# Patient Record
Sex: Female | Born: 1960 | Race: White | Hispanic: No | Marital: Married | Smoking: Current every day smoker
Health system: Southern US, Community
[De-identification: ages and names within clinical notes are randomized; demographics above are authoritative.]

## PROBLEM LIST (undated history)

## (undated) DIAGNOSIS — E785 Hyperlipidemia, unspecified: Secondary | ICD-10-CM

## (undated) DIAGNOSIS — N3001 Acute cystitis with hematuria: Secondary | ICD-10-CM

## (undated) DIAGNOSIS — I700 Atherosclerosis of aorta: Secondary | ICD-10-CM

## (undated) DIAGNOSIS — I671 Cerebral aneurysm, nonruptured: Secondary | ICD-10-CM

## (undated) DIAGNOSIS — G43909 Migraine, unspecified, not intractable, without status migrainosus: Secondary | ICD-10-CM

## (undated) DIAGNOSIS — G40909 Epilepsy, unspecified, not intractable, without status epilepticus: Secondary | ICD-10-CM

## (undated) DIAGNOSIS — K219 Gastro-esophageal reflux disease without esophagitis: Secondary | ICD-10-CM

## (undated) DIAGNOSIS — Z9289 Personal history of other medical treatment: Secondary | ICD-10-CM

## (undated) DIAGNOSIS — E559 Vitamin D deficiency, unspecified: Secondary | ICD-10-CM

## (undated) DIAGNOSIS — Z6822 Body mass index (BMI) 22.0-22.9, adult: Secondary | ICD-10-CM

## (undated) DIAGNOSIS — F419 Anxiety disorder, unspecified: Secondary | ICD-10-CM

## (undated) DIAGNOSIS — R112 Nausea with vomiting, unspecified: Secondary | ICD-10-CM

## (undated) DIAGNOSIS — Z87442 Personal history of urinary calculi: Secondary | ICD-10-CM

## (undated) DIAGNOSIS — B977 Papillomavirus as the cause of diseases classified elsewhere: Secondary | ICD-10-CM

## (undated) DIAGNOSIS — E119 Type 2 diabetes mellitus without complications: Secondary | ICD-10-CM

## (undated) DIAGNOSIS — G250 Essential tremor: Secondary | ICD-10-CM

## (undated) DIAGNOSIS — Z9889 Other specified postprocedural states: Secondary | ICD-10-CM

## (undated) DIAGNOSIS — R413 Other amnesia: Secondary | ICD-10-CM

## (undated) DIAGNOSIS — J189 Pneumonia, unspecified organism: Secondary | ICD-10-CM

## (undated) DIAGNOSIS — D649 Anemia, unspecified: Secondary | ICD-10-CM

## (undated) DIAGNOSIS — I739 Peripheral vascular disease, unspecified: Secondary | ICD-10-CM

## (undated) DIAGNOSIS — M1990 Unspecified osteoarthritis, unspecified site: Secondary | ICD-10-CM

## (undated) DIAGNOSIS — K5900 Constipation, unspecified: Secondary | ICD-10-CM

## (undated) DIAGNOSIS — F32A Depression, unspecified: Secondary | ICD-10-CM

## (undated) HISTORY — PX: BREAST BIOPSY: SHX20

## (undated) HISTORY — DX: Atherosclerosis of aorta: I70.0

## (undated) HISTORY — DX: Acute cystitis with hematuria: N30.01

## (undated) HISTORY — DX: Essential tremor: G25.0

## (undated) HISTORY — DX: Hyperlipidemia, unspecified: E78.5

## (undated) HISTORY — DX: Cerebral aneurysm, nonruptured: I67.1

## (undated) HISTORY — DX: Body mass index (BMI) 22.0-22.9, adult: Z68.22

## (undated) HISTORY — PX: CATARACT EXTRACTION: SUR2

## (undated) HISTORY — PX: SALPINGOOPHORECTOMY: SHX82

## (undated) HISTORY — PX: ABDOMINAL HYSTERECTOMY: SHX81

## (undated) HISTORY — DX: Anxiety disorder, unspecified: F41.9

## (undated) HISTORY — DX: Vitamin D deficiency, unspecified: E55.9

## (undated) HISTORY — PX: VAGINAL HYSTERECTOMY: SUR661

## (undated) HISTORY — DX: Migraine, unspecified, not intractable, without status migrainosus: G43.909

## (undated) HISTORY — DX: Gastro-esophageal reflux disease without esophagitis: K21.9

## (undated) HISTORY — DX: Epilepsy, unspecified, not intractable, without status epilepticus: G40.909

## (undated) HISTORY — DX: Constipation, unspecified: K59.00

## (undated) HISTORY — PX: CARPAL TUNNEL RELEASE: SHX101

## (undated) HISTORY — DX: Type 2 diabetes mellitus without complications: E11.9

---

## 2002-12-26 ENCOUNTER — Encounter: Payer: Self-pay | Admitting: Specialist

## 2002-12-27 ENCOUNTER — Ambulatory Visit (HOSPITAL_COMMUNITY): Admission: RE | Admit: 2002-12-27 | Discharge: 2002-12-27 | Payer: Self-pay | Admitting: Specialist

## 2013-07-26 DIAGNOSIS — R259 Unspecified abnormal involuntary movements: Secondary | ICD-10-CM | POA: Insufficient documentation

## 2013-07-26 DIAGNOSIS — G40209 Localization-related (focal) (partial) symptomatic epilepsy and epileptic syndromes with complex partial seizures, not intractable, without status epilepticus: Secondary | ICD-10-CM | POA: Insufficient documentation

## 2013-07-26 DIAGNOSIS — R413 Other amnesia: Secondary | ICD-10-CM | POA: Insufficient documentation

## 2013-07-26 HISTORY — DX: Unspecified abnormal involuntary movements: R25.9

## 2013-07-26 HISTORY — DX: Localization-related (focal) (partial) symptomatic epilepsy and epileptic syndromes with complex partial seizures, not intractable, without status epilepticus: G40.209

## 2014-03-08 DIAGNOSIS — F418 Other specified anxiety disorders: Secondary | ICD-10-CM | POA: Insufficient documentation

## 2014-03-08 HISTORY — DX: Other specified anxiety disorders: F41.8

## 2014-05-10 DIAGNOSIS — G44229 Chronic tension-type headache, not intractable: Secondary | ICD-10-CM

## 2014-05-10 HISTORY — DX: Chronic tension-type headache, not intractable: G44.229

## 2017-05-20 DIAGNOSIS — Z1339 Encounter for screening examination for other mental health and behavioral disorders: Secondary | ICD-10-CM | POA: Diagnosis not present

## 2017-05-20 DIAGNOSIS — G40909 Epilepsy, unspecified, not intractable, without status epilepticus: Secondary | ICD-10-CM | POA: Diagnosis not present

## 2017-05-20 DIAGNOSIS — H6693 Otitis media, unspecified, bilateral: Secondary | ICD-10-CM | POA: Diagnosis not present

## 2017-05-20 DIAGNOSIS — Z1331 Encounter for screening for depression: Secondary | ICD-10-CM | POA: Diagnosis not present

## 2017-05-20 DIAGNOSIS — Z7689 Persons encountering health services in other specified circumstances: Secondary | ICD-10-CM | POA: Diagnosis not present

## 2017-05-25 DIAGNOSIS — Z6822 Body mass index (BMI) 22.0-22.9, adult: Secondary | ICD-10-CM | POA: Diagnosis not present

## 2017-05-25 DIAGNOSIS — R252 Cramp and spasm: Secondary | ICD-10-CM | POA: Diagnosis not present

## 2017-06-04 DIAGNOSIS — R3129 Other microscopic hematuria: Secondary | ICD-10-CM | POA: Diagnosis not present

## 2017-06-04 DIAGNOSIS — E781 Pure hyperglyceridemia: Secondary | ICD-10-CM | POA: Diagnosis not present

## 2017-06-04 DIAGNOSIS — Z01419 Encounter for gynecological examination (general) (routine) without abnormal findings: Secondary | ICD-10-CM | POA: Diagnosis not present

## 2017-06-04 DIAGNOSIS — Z6822 Body mass index (BMI) 22.0-22.9, adult: Secondary | ICD-10-CM | POA: Diagnosis not present

## 2017-06-04 DIAGNOSIS — Z1231 Encounter for screening mammogram for malignant neoplasm of breast: Secondary | ICD-10-CM | POA: Diagnosis not present

## 2017-06-17 DIAGNOSIS — R413 Other amnesia: Secondary | ICD-10-CM | POA: Diagnosis not present

## 2017-06-17 DIAGNOSIS — G43009 Migraine without aura, not intractable, without status migrainosus: Secondary | ICD-10-CM

## 2017-06-17 DIAGNOSIS — G40209 Localization-related (focal) (partial) symptomatic epilepsy and epileptic syndromes with complex partial seizures, not intractable, without status epilepticus: Secondary | ICD-10-CM | POA: Diagnosis not present

## 2017-06-17 DIAGNOSIS — Z1211 Encounter for screening for malignant neoplasm of colon: Secondary | ICD-10-CM | POA: Diagnosis not present

## 2017-06-17 DIAGNOSIS — R251 Tremor, unspecified: Secondary | ICD-10-CM | POA: Diagnosis not present

## 2017-06-17 DIAGNOSIS — G250 Essential tremor: Secondary | ICD-10-CM

## 2017-06-17 DIAGNOSIS — G40909 Epilepsy, unspecified, not intractable, without status epilepticus: Secondary | ICD-10-CM | POA: Diagnosis not present

## 2017-06-17 HISTORY — DX: Migraine without aura, not intractable, without status migrainosus: G43.009

## 2017-06-17 HISTORY — DX: Essential tremor: G25.0

## 2017-06-29 DIAGNOSIS — E871 Hypo-osmolality and hyponatremia: Secondary | ICD-10-CM | POA: Diagnosis not present

## 2017-07-01 DIAGNOSIS — R928 Other abnormal and inconclusive findings on diagnostic imaging of breast: Secondary | ICD-10-CM | POA: Diagnosis not present

## 2017-07-01 DIAGNOSIS — N6324 Unspecified lump in the left breast, lower inner quadrant: Secondary | ICD-10-CM | POA: Diagnosis not present

## 2017-07-01 DIAGNOSIS — N6320 Unspecified lump in the left breast, unspecified quadrant: Secondary | ICD-10-CM | POA: Diagnosis not present

## 2017-07-01 DIAGNOSIS — N6322 Unspecified lump in the left breast, upper inner quadrant: Secondary | ICD-10-CM | POA: Diagnosis not present

## 2017-07-01 DIAGNOSIS — R922 Inconclusive mammogram: Secondary | ICD-10-CM | POA: Diagnosis not present

## 2017-07-02 DIAGNOSIS — E781 Pure hyperglyceridemia: Secondary | ICD-10-CM | POA: Diagnosis not present

## 2017-07-02 DIAGNOSIS — N6320 Unspecified lump in the left breast, unspecified quadrant: Secondary | ICD-10-CM | POA: Diagnosis not present

## 2017-07-02 DIAGNOSIS — G40909 Epilepsy, unspecified, not intractable, without status epilepticus: Secondary | ICD-10-CM | POA: Diagnosis not present

## 2017-07-02 DIAGNOSIS — E785 Hyperlipidemia, unspecified: Secondary | ICD-10-CM | POA: Diagnosis not present

## 2017-07-02 DIAGNOSIS — Z6822 Body mass index (BMI) 22.0-22.9, adult: Secondary | ICD-10-CM | POA: Diagnosis not present

## 2017-07-06 DIAGNOSIS — N6324 Unspecified lump in the left breast, lower inner quadrant: Secondary | ICD-10-CM | POA: Diagnosis not present

## 2017-07-06 DIAGNOSIS — D242 Benign neoplasm of left breast: Secondary | ICD-10-CM | POA: Diagnosis not present

## 2017-07-06 DIAGNOSIS — R928 Other abnormal and inconclusive findings on diagnostic imaging of breast: Secondary | ICD-10-CM | POA: Diagnosis not present

## 2017-07-06 DIAGNOSIS — N6322 Unspecified lump in the left breast, upper inner quadrant: Secondary | ICD-10-CM | POA: Diagnosis not present

## 2017-07-24 DIAGNOSIS — D124 Benign neoplasm of descending colon: Secondary | ICD-10-CM | POA: Diagnosis not present

## 2017-07-24 DIAGNOSIS — K635 Polyp of colon: Secondary | ICD-10-CM | POA: Diagnosis not present

## 2017-07-24 DIAGNOSIS — Z1211 Encounter for screening for malignant neoplasm of colon: Secondary | ICD-10-CM | POA: Diagnosis not present

## 2017-07-28 DIAGNOSIS — G40909 Epilepsy, unspecified, not intractable, without status epilepticus: Secondary | ICD-10-CM | POA: Diagnosis not present

## 2017-07-28 DIAGNOSIS — G40209 Localization-related (focal) (partial) symptomatic epilepsy and epileptic syndromes with complex partial seizures, not intractable, without status epilepticus: Secondary | ICD-10-CM | POA: Diagnosis not present

## 2017-08-17 DIAGNOSIS — G40209 Localization-related (focal) (partial) symptomatic epilepsy and epileptic syndromes with complex partial seizures, not intractable, without status epilepticus: Secondary | ICD-10-CM | POA: Diagnosis not present

## 2017-08-17 DIAGNOSIS — G43009 Migraine without aura, not intractable, without status migrainosus: Secondary | ICD-10-CM | POA: Diagnosis not present

## 2017-09-30 DIAGNOSIS — G250 Essential tremor: Secondary | ICD-10-CM | POA: Diagnosis not present

## 2017-09-30 DIAGNOSIS — H532 Diplopia: Secondary | ICD-10-CM | POA: Insufficient documentation

## 2017-09-30 DIAGNOSIS — G43009 Migraine without aura, not intractable, without status migrainosus: Secondary | ICD-10-CM | POA: Diagnosis not present

## 2017-09-30 DIAGNOSIS — G40209 Localization-related (focal) (partial) symptomatic epilepsy and epileptic syndromes with complex partial seizures, not intractable, without status epilepticus: Secondary | ICD-10-CM | POA: Diagnosis not present

## 2017-09-30 HISTORY — DX: Diplopia: H53.2

## 2017-10-09 DIAGNOSIS — H532 Diplopia: Secondary | ICD-10-CM | POA: Diagnosis not present

## 2017-10-09 DIAGNOSIS — R9082 White matter disease, unspecified: Secondary | ICD-10-CM | POA: Diagnosis not present

## 2017-10-09 DIAGNOSIS — G40209 Localization-related (focal) (partial) symptomatic epilepsy and epileptic syndromes with complex partial seizures, not intractable, without status epilepticus: Secondary | ICD-10-CM | POA: Diagnosis not present

## 2017-10-19 DIAGNOSIS — G40209 Localization-related (focal) (partial) symptomatic epilepsy and epileptic syndromes with complex partial seizures, not intractable, without status epilepticus: Secondary | ICD-10-CM | POA: Diagnosis not present

## 2017-10-19 DIAGNOSIS — G250 Essential tremor: Secondary | ICD-10-CM | POA: Diagnosis not present

## 2017-10-19 DIAGNOSIS — G43009 Migraine without aura, not intractable, without status migrainosus: Secondary | ICD-10-CM | POA: Diagnosis not present

## 2018-02-18 DIAGNOSIS — Z23 Encounter for immunization: Secondary | ICD-10-CM | POA: Diagnosis not present

## 2018-03-29 DIAGNOSIS — G250 Essential tremor: Secondary | ICD-10-CM | POA: Diagnosis not present

## 2018-03-29 DIAGNOSIS — G40209 Localization-related (focal) (partial) symptomatic epilepsy and epileptic syndromes with complex partial seizures, not intractable, without status epilepticus: Secondary | ICD-10-CM | POA: Diagnosis not present

## 2018-03-29 DIAGNOSIS — R259 Unspecified abnormal involuntary movements: Secondary | ICD-10-CM | POA: Diagnosis not present

## 2018-03-29 DIAGNOSIS — G43009 Migraine without aura, not intractable, without status migrainosus: Secondary | ICD-10-CM | POA: Diagnosis not present

## 2018-06-08 DIAGNOSIS — Z6822 Body mass index (BMI) 22.0-22.9, adult: Secondary | ICD-10-CM | POA: Diagnosis not present

## 2018-06-08 DIAGNOSIS — R9431 Abnormal electrocardiogram [ECG] [EKG]: Secondary | ICD-10-CM | POA: Diagnosis not present

## 2018-06-08 DIAGNOSIS — Z1331 Encounter for screening for depression: Secondary | ICD-10-CM | POA: Diagnosis not present

## 2018-06-08 DIAGNOSIS — G40909 Epilepsy, unspecified, not intractable, without status epilepticus: Secondary | ICD-10-CM | POA: Diagnosis not present

## 2018-06-08 DIAGNOSIS — Z0000 Encounter for general adult medical examination without abnormal findings: Secondary | ICD-10-CM | POA: Diagnosis not present

## 2018-06-08 DIAGNOSIS — F419 Anxiety disorder, unspecified: Secondary | ICD-10-CM | POA: Diagnosis not present

## 2018-06-22 ENCOUNTER — Ambulatory Visit: Payer: Medicare HMO | Admitting: Cardiology

## 2018-06-22 ENCOUNTER — Encounter: Payer: Self-pay | Admitting: Cardiology

## 2018-06-22 VITALS — BP 122/70 | HR 91 | Ht 63.0 in | Wt 127.0 lb

## 2018-06-22 DIAGNOSIS — E782 Mixed hyperlipidemia: Secondary | ICD-10-CM

## 2018-06-22 DIAGNOSIS — G250 Essential tremor: Secondary | ICD-10-CM

## 2018-06-22 DIAGNOSIS — R9431 Abnormal electrocardiogram [ECG] [EKG]: Secondary | ICD-10-CM

## 2018-06-22 DIAGNOSIS — R0789 Other chest pain: Secondary | ICD-10-CM

## 2018-06-22 HISTORY — DX: Other chest pain: R07.89

## 2018-06-22 HISTORY — DX: Mixed hyperlipidemia: E78.2

## 2018-06-22 NOTE — Addendum Note (Signed)
Addended by: Orland Penman on: 06/22/2018 04:17 PM   Modules accepted: Orders

## 2018-06-22 NOTE — Patient Instructions (Signed)
Medication Instructions:   Your physician recommends that you continue on your current medications as directed. Please refer to the Current Medication list given to you today.   If you need a refill on your cardiac medications before your next appointment, please call your pharmacy.   Lab work:  NONE   Testing/Procedures:  Your physician has requested that you have a lexiscan myoview. For further information please visit [email or web address]. Please follow instruction sheet, as given.   Nuclear Medicine Exam  A nuclear medicine exam is a safe and painless imaging test. It helps your health care provider detect and diagnose disease in the body. It also provides information about the way your organs work and how they are structured. For a nuclear medicine exam, you will be given a radioactive tracer. This substance is absorbed by your body's organs. A large scanning machine detects the tracer and creates pictures of the areas that your health care provider wants to look at. There are several kinds of nuclear medicine exams. They include:  CT scan.  MRI.  PET scan. Tell a health care provider about:  Any allergies you have.  All medicines you are taking, including vitamins, herbs, eye drops, creams, and over-the-counter medicines.  Any problems you or family members have had with anesthetic medicines.  Any blood disorders you have.  Any surgeries you have had.  Any medical conditions you have.  Whether you are pregnant or may be pregnant.  Whether you are nursing. What happens before the procedure?  Ask your health care provider about changing or stopping your regular medicines.  Follow instructions from your health care provider about eating or drinking restrictions.  Do not wear jewelry.  Wear loose, comfortable clothing. You may be asked to wear a hospital gown for the procedure.  Bring previous imaging studies, such as X-rays, with you to the exam if they are  available. What happens during the procedure?  An IV tube may be inserted into one of your veins.  You will be asked to lie on a table or sit in a chair.  You will be given the radioactive tracer. You may get: ? A pill or liquid to swallow. ? An injection. ? Medicine through your IV tube. ? A gas to inhale.  A large scanning machine will be used to create images of your body. After the pictures are taken, you may have to wait so your health care provider can make sure that enough good images were taken. The procedure may vary among health care providers and hospitals. What happens after the procedure?  You may go right home after the procedure and return to your usual activities, unless told otherwise by your health care provider.  Drink enough water to keep urine clear or pale yellow. This helps to flush the radioactive tracer out of your body.  It is your responsibility to get your test results. Ask your health care provider or the department performing the test when your results will be ready.  Seek immediate medical care if you have shortness of breath. This information is not intended to replace advice given to you by your health care provider. Make sure you discuss any questions you have with your health care provider. Document Released: 05/29/2004 Document Revised: 12/20/2015 Document Reviewed: 11/08/2014 Elsevier Interactive Patient Education  2019 [employer].   Follow-Up: At [facility], you and your health needs are our priority.  As part of our continuing mission to provide you with exceptional heart care, we have  created designated Provider Care Teams.  These Care Teams include your primary Cardiologist (physician) and Advanced Practice Providers (APPs -  Physician Assistants and Nurse Practitioners) who all work together to provide you with the care you need, when you need it.  . You will need a follow up appointment in 6 months.  Please call our office 2 months in  advance to schedule this appointment.

## 2018-06-22 NOTE — Progress Notes (Signed)
Cardiology Office Note:    Date:  06/22/2018   ID:  Lindsay Erickson, DOB [birth year], MRN [id number]  PCP:  Serita Grammes, MD  Cardiologist:  Jenean Lindau, MD   Referring MD: Serita Grammes, MD    ASSESSMENT:    1. Benign familial tremor   2. Chest discomfort   3. Mixed dyslipidemia    PLAN:    In order of problems listed above:  1. Primary prevention stressed with the patient.  Importance of compliance with diet and medication stressed and she vocalized understanding.  Her blood pressure is stable. 2. Diet was discussed for dyslipidemia.  Lipids from January last year were reviewed and I discussed diet with her.  I told her to keep a track of her lipids with her primary care physician. 3. In view of her symptoms I will do a Lexiscan sestamibi.  Because of a seizure history she has issues with ambulation and prefers a pharmacological stress test.  Further recommendations will be made based on the findings of that test.  She knows to go to the nearest emergency room for any concerning symptoms. 4. Patient will be seen in follow-up appointment in 6 months or earlier if the patient has any concerns    Medication Adjustments/Labs and Tests Ordered: Current medicines are reviewed at length with the patient today.  Concerns regarding medicines are outlined above.  No orders of the defined types were placed in this encounter.  No orders of the defined types were placed in this encounter.    History of Present Illness:    Lindsay Erickson is a 58 y.o. female who is being seen today for the evaluation of chest discomfort at the request of Serita Grammes, MD.  Patient is a pleasant 58 year old female.  She is accompanied by her husband.  She has history of seizures and familial tremors.  She mentions to me that occasionally she will have chest discomfort.  This is not related to stress or exertion.  No orthopnea or PND.  No radiation of the symptoms to any part of the  body.  She mentions to me that she requested a primary care doctor for this evaluation because of the symptoms and her concerns.  At the time of my evaluation, the patient is alert awake oriented and in no distress.  Past Medical History:  Diagnosis Date  . Acid reflux   . Anxiety   . Constipation   . Migraine   . Seizure disorder [facility])     Past Surgical History:  Procedure Laterality Date  . ABDOMINAL HYSTERECTOMY      Current Medications: Current Meds  Medication Sig  . divalproex (DEPAKOTE ER) 500 MG 24 hr tablet Take 1 tablet by mouth 2 (two) times daily.  Marland Kitchen escitalopram (LEXAPRO) 5 MG tablet Take 1 tablet by mouth daily.     Allergies:   Gabapentin; Primidone; Propranolol; Venlafaxine; and Codeine   Social History   Socioeconomic History  . Marital status: Married    Spouse name: Not on file  . Number of children: Not on file  . Years of education: Not on file  . Highest education level: Not on file  Occupational History  . Not on file  Social Needs  . Financial resource strain: Not on file  . Food insecurity:    Worry: Not on file    Inability: Not on file  . Transportation needs:    Medical: Not on file    Non-medical: Not on file  Tobacco Use  . Smoking status: Current Every Day Smoker  . Smokeless tobacco: Never Used  Substance and Sexual Activity  . Alcohol use: Not on file  . Drug use: Not on file  . Sexual activity: Not on file  Lifestyle  . Physical activity:    Days per week: Not on file    Minutes per session: Not on file  . Stress: Not on file  Relationships  . Social connections:    Talks on phone: Not on file    Gets together: Not on file    Attends religious service: Not on file    Active member of club or organization: Not on file    Attends meetings of clubs or organizations: Not on file    Relationship status: Not on file  Other Topics Concern  . Not on file  Social History Narrative  . Not on file     Family History: The  patient's family history includes Heart disease in her father and mother.  ROS:   Please see the history of present illness.    All other systems reviewed and are negative.  EKGs/Labs/Other Studies Reviewed:    The following studies were reviewed today: EKG reveals sinus rhythm and nonspecific ST-T changes from her primary care doctor   Recent Labs: No results found for requested labs within last 8760 hours.  Recent Lipid Panel No results found for: CHOL, TRIG, HDL, CHOLHDL, VLDL, LDLCALC, LDLDIRECT  Physical Exam:    VS:  BP 122/70 (BP Location: Right Arm, Patient Position: Sitting, Cuff Size: Normal)   Pulse 91   Ht 5\' 3"  (1.6 m)   Wt 127 lb (57.6 kg)   SpO2 97%   BMI 22.50 kg/m     Wt Readings from Last 3 Encounters:  06/22/18 127 lb (57.6 kg)     GEN: Patient is in no acute distress HEENT: Normal NECK: No JVD; No carotid bruits LYMPHATICS: No lymphadenopathy CARDIAC: S1 S2 regular, 2/6 systolic murmur at the apex. RESPIRATORY:  Clear to auscultation without rales, wheezing or rhonchi  ABDOMEN: Soft, non-tender, non-distended MUSCULOSKELETAL:  No edema; No deformity  SKIN: Warm and dry NEUROLOGIC:  Alert and oriented x 3 PSYCHIATRIC:  Normal affect    Signed, Jenean Lindau, MD  06/22/2018 4:09 PM    [facility]

## 2018-07-05 DIAGNOSIS — E785 Hyperlipidemia, unspecified: Secondary | ICD-10-CM | POA: Diagnosis not present

## 2018-07-05 DIAGNOSIS — Z79899 Other long term (current) drug therapy: Secondary | ICD-10-CM | POA: Diagnosis not present

## 2018-07-06 DIAGNOSIS — Z1231 Encounter for screening mammogram for malignant neoplasm of breast: Secondary | ICD-10-CM | POA: Diagnosis not present

## 2018-07-09 ENCOUNTER — Other Ambulatory Visit: Payer: Self-pay | Admitting: *Deleted

## 2018-07-09 NOTE — Patient Outreach (Signed)
[facility]) Care Management  07/09/2018  JAMILA SLATTEN 08/24/1960 [id number]   Telephone Screen  Referral Date: 07/08/2018 Referral Source: self referral Referral Reason: self referral  Insurance: [place] attempt # 1 successful to the home number  Patient is able to verify HIPAA Reviewed and addressed referral to [facility] with patient   Mrs Zeidan voices concern about her results of her recent wellness check at her primary care MD and is wanting some assistance in her home care related to reducing her cholesterol values, and other medical diagnoses like migraines.   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 but has to take breaks if she "gets over heated" She has difficulty writing related to her tremors  She is on disability   Conditions: mixed hyperlipidemia,Benign familial tremors, Migraine without aura and without status migrainosus, not intractable, partial epilepsy with impairment of consciousness, 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 Patient gave verbal consent for  services.  Plan: [facility] RN CM will refer Mrs Schwartzkopf to [facility] SW for assistance with advance directives and smoke cessation resources   CM sent Mrs Bolding via mail and possible e mail address the following EMMI education on High cholesterol, can foods or supplements lower cholesterol?,  low cholesterol, saturated fat, and trans fat diet, migraines in adults, home headache remedies, and essential tremor  Rasheen Bells L. Lavina Hamman, RN, BSN, [place] Coordinator Office number [phone] Mobile number [phone]  Main [facility] number [phone] Fax number [phone]

## 2018-07-13 ENCOUNTER — Telehealth (HOSPITAL_COMMUNITY): Payer: Self-pay | Admitting: *Deleted

## 2018-07-13 NOTE — Telephone Encounter (Signed)
Patient given detailed instructions per Myocardial Perfusion Study Information Sheet for the test on 07/20/18 at 1130. Patient notified to arrive 15 minutes early and that it is imperative to arrive on time for appointment to keep from having the test rescheduled.  If you need to cancel or reschedule your appointment, please call the office within 24 hours of your appointment. . Patient verbalized understanding.Andersson Larr[place]ee, Ranae Palms

## 2018-07-19 ENCOUNTER — Other Ambulatory Visit: Payer: Self-pay | Admitting: *Deleted

## 2018-07-19 NOTE — Patient Outreach (Signed)
[facility]) Care Management  07/19/2018  Lindsay Erickson 1960/11/26 [id number]   CSW received referral and was able to make contact with pt today by phone. Pt's identity was confirmed and CSW introduced self and role to pt. Pt reports she went to her PCP for wellness check and is wanting to quit smoking. She is familiar with the "1-800-quit now" number and program and is considering calling them.  She also may talk to her PCP about RX for smoking cessation.  CSW offered to mail her some info that may be helpful. CSW also discussed Advance Directives to which she states she does not have in place- she is interested in a HCPOA and CSW will mail Advance Directive packet to her and plan a follow up call in 1-2 weeks.    Eduard Clos, MSW, [place] Worker  [place] [phone]

## 2018-07-20 ENCOUNTER — Other Ambulatory Visit: Payer: Self-pay

## 2018-07-20 ENCOUNTER — Ambulatory Visit (INDEPENDENT_AMBULATORY_CARE_PROVIDER_SITE_OTHER): Payer: Medicare HMO

## 2018-07-20 VITALS — Ht 63.0 in | Wt 127.0 lb

## 2018-07-20 DIAGNOSIS — R0789 Other chest pain: Secondary | ICD-10-CM

## 2018-07-20 DIAGNOSIS — R9431 Abnormal electrocardiogram [ECG] [EKG]: Secondary | ICD-10-CM

## 2018-07-20 LAB — MYOCARDIAL PERFUSION IMAGING
LV dias vol: 42 mL (ref 46–106)
LV sys vol: 9 mL
Peak HR: 98 {beats}/min
Rest HR: 83 {beats}/min
SDS: 2
SRS: 6
SSS: 8
TID: 1.28

## 2018-07-20 MED ORDER — ADENOSINE (DIAGNOSTIC) 3 MG/ML IV SOLN
0.5600 mg/kg | Freq: Once | INTRAVENOUS | Status: AC
  Administered 2018-07-20: 32.4 mg via INTRAVENOUS

## 2018-07-20 MED ORDER — TECHNETIUM TC 99M TETROFOSMIN IV KIT
11.0000 | PACK | Freq: Once | INTRAVENOUS | Status: AC | PRN
  Administered 2018-07-20: 11 via INTRAVENOUS

## 2018-07-20 MED ORDER — TECHNETIUM TC 99M TETROFOSMIN IV KIT
30.7000 | PACK | Freq: Once | INTRAVENOUS | Status: AC | PRN
  Administered 2018-07-20: 30.7 via INTRAVENOUS

## 2018-07-21 ENCOUNTER — Telehealth: Payer: Self-pay

## 2018-07-21 ENCOUNTER — Other Ambulatory Visit: Payer: Self-pay

## 2018-07-21 NOTE — Patient Outreach (Signed)
[facility]) Care Management  07/21/2018  JAYLEY HUSTEAD November 03, 1960 [id number]   Request received from [place], Eduard Clos, to send Advance Directive Packet and smoking cessation resources to patient.  Advance Directive EMMI and packet mailed today.  [facility] Tobacco Education information also mailed.  BSW will follow up with patient within the next two weeks to ensure receipt.  Ronn Melena, BSW Social Worker [phone]

## 2018-07-21 NOTE — Telephone Encounter (Signed)
-----   Message from Jenean Lindau, MD sent at 07/20/2018  4:03 PM EDT ----- The results of the study is unremarkable. Please inform patient. I will discuss in detail at next appointment. Cc  primary care/referring physician Jenean Lindau, MD 07/20/2018 4:03 PM

## 2018-07-21 NOTE — Telephone Encounter (Signed)
Patient called and notified of test results. 

## 2018-07-23 ENCOUNTER — Other Ambulatory Visit: Payer: Self-pay | Admitting: *Deleted

## 2018-07-23 NOTE — Patient Outreach (Signed)
[place] [facility]) Care Management  07/23/2018  BREAN CARBERRY Feb 05, 1961 [id number]  Care coordination  [facility] RN CM called to follow up on mailed EMMI education and possible referrals to [facility] health coach services   Outreach attempt # 1 No answer. [facility] RN CM left HIPAA compliant voicemail message along with CM's contact info.   Plan: [facility] RN CM scheduled this engaged patient for another call attempt within 4 business days    Toby Breithaupt L. Lavina Hamman, RN, BSN, [place] Coordinator Office number [phone] Mobile number [phone]  Main [facility] number [phone] Fax number [phone]

## 2018-07-26 ENCOUNTER — Other Ambulatory Visit: Payer: Self-pay

## 2018-07-26 ENCOUNTER — Other Ambulatory Visit: Payer: Self-pay | Admitting: *Deleted

## 2018-07-26 NOTE — Patient Outreach (Signed)
[facility]) Care Management  07/26/2018  VONDRA ALDREDGE 1961-04-18 [id number]   Care coordination   Mrs Heard confirms she received EMMI education information and will be soon going over it with her husband She states "It looks so far self explanatory" Cm encouraged her to contact CM if needed to review any of the EMMI materials and to answer questions She was notified of not being a candidate at this time for [facility] health coach services. She voiced understanding    [facility] RN CM will close case at this time as patient has been assessed and needs resolved for [facility] telephonic RN CM  She continues to be followed by [facility] SW staff Pt encouraged to return a call to [facility] RN CM prn   Kimberly L. Lavina Hamman, RN, BSN, [place] Coordinator Office number [phone] Mobile number [phone]  Main [facility] number [phone] Fax number [phone]

## 2018-07-30 ENCOUNTER — Ambulatory Visit: Payer: Self-pay | Admitting: *Deleted

## 2018-08-04 ENCOUNTER — Other Ambulatory Visit: Payer: Self-pay

## 2018-08-04 NOTE — Patient Outreach (Signed)
[facility]) Care Management  08/04/2018  Lindsay Erickson 1960/07/08 [id number]   Successful outreach to patient to ensure receipt of smoking cessation resources and Advance Directives packet.  Patient did confirm receipt and said that she has reviewed information.  She has not had time to discuss specifics of AD with family members. She denied questions at this time.  BSW is closing case but encouraged her to call if questions arise.  Ronn Melena, BSW Social Worker [phone]

## 2018-08-20 DIAGNOSIS — Z6821 Body mass index (BMI) 21.0-21.9, adult: Secondary | ICD-10-CM | POA: Diagnosis not present

## 2018-08-20 DIAGNOSIS — R300 Dysuria: Secondary | ICD-10-CM | POA: Diagnosis not present

## 2018-08-20 DIAGNOSIS — N200 Calculus of kidney: Secondary | ICD-10-CM | POA: Diagnosis not present

## 2018-09-10 DIAGNOSIS — Z6822 Body mass index (BMI) 22.0-22.9, adult: Secondary | ICD-10-CM | POA: Diagnosis not present

## 2018-09-10 DIAGNOSIS — E785 Hyperlipidemia, unspecified: Secondary | ICD-10-CM | POA: Diagnosis not present

## 2018-09-10 DIAGNOSIS — N200 Calculus of kidney: Secondary | ICD-10-CM | POA: Diagnosis not present

## 2018-09-10 DIAGNOSIS — J302 Other seasonal allergic rhinitis: Secondary | ICD-10-CM | POA: Diagnosis not present

## 2018-10-02 DIAGNOSIS — F419 Anxiety disorder, unspecified: Secondary | ICD-10-CM | POA: Diagnosis not present

## 2018-10-02 DIAGNOSIS — K219 Gastro-esophageal reflux disease without esophagitis: Secondary | ICD-10-CM | POA: Diagnosis not present

## 2018-10-04 DIAGNOSIS — G43009 Migraine without aura, not intractable, without status migrainosus: Secondary | ICD-10-CM | POA: Diagnosis not present

## 2018-10-04 DIAGNOSIS — R413 Other amnesia: Secondary | ICD-10-CM | POA: Diagnosis not present

## 2018-10-04 DIAGNOSIS — G250 Essential tremor: Secondary | ICD-10-CM | POA: Diagnosis not present

## 2018-10-04 DIAGNOSIS — G40209 Localization-related (focal) (partial) symptomatic epilepsy and epileptic syndromes with complex partial seizures, not intractable, without status epilepticus: Secondary | ICD-10-CM | POA: Diagnosis not present

## 2018-11-02 DIAGNOSIS — F419 Anxiety disorder, unspecified: Secondary | ICD-10-CM | POA: Diagnosis not present

## 2018-11-02 DIAGNOSIS — E785 Hyperlipidemia, unspecified: Secondary | ICD-10-CM | POA: Diagnosis not present

## 2018-11-02 DIAGNOSIS — K219 Gastro-esophageal reflux disease without esophagitis: Secondary | ICD-10-CM | POA: Diagnosis not present

## 2018-12-03 DIAGNOSIS — F419 Anxiety disorder, unspecified: Secondary | ICD-10-CM | POA: Diagnosis not present

## 2018-12-03 DIAGNOSIS — G40909 Epilepsy, unspecified, not intractable, without status epilepticus: Secondary | ICD-10-CM | POA: Diagnosis not present

## 2018-12-13 DIAGNOSIS — F17210 Nicotine dependence, cigarettes, uncomplicated: Secondary | ICD-10-CM | POA: Diagnosis not present

## 2018-12-13 DIAGNOSIS — F419 Anxiety disorder, unspecified: Secondary | ICD-10-CM | POA: Diagnosis not present

## 2018-12-13 DIAGNOSIS — E785 Hyperlipidemia, unspecified: Secondary | ICD-10-CM | POA: Diagnosis not present

## 2018-12-13 DIAGNOSIS — G40909 Epilepsy, unspecified, not intractable, without status epilepticus: Secondary | ICD-10-CM | POA: Diagnosis not present

## 2018-12-22 ENCOUNTER — Ambulatory Visit: Payer: Medicare HMO | Admitting: Cardiology

## 2019-01-03 DIAGNOSIS — E785 Hyperlipidemia, unspecified: Secondary | ICD-10-CM | POA: Diagnosis not present

## 2019-01-03 DIAGNOSIS — F419 Anxiety disorder, unspecified: Secondary | ICD-10-CM | POA: Diagnosis not present

## 2019-01-03 DIAGNOSIS — G40909 Epilepsy, unspecified, not intractable, without status epilepticus: Secondary | ICD-10-CM | POA: Diagnosis not present

## 2019-01-05 ENCOUNTER — Other Ambulatory Visit: Payer: Self-pay

## 2019-01-05 ENCOUNTER — Ambulatory Visit (INDEPENDENT_AMBULATORY_CARE_PROVIDER_SITE_OTHER): Payer: Medicare HMO | Admitting: Cardiology

## 2019-01-05 ENCOUNTER — Encounter: Payer: Self-pay | Admitting: Cardiology

## 2019-01-05 VITALS — BP 120/70 | HR 104 | Ht 63.0 in | Wt 123.0 lb

## 2019-01-05 DIAGNOSIS — E782 Mixed hyperlipidemia: Secondary | ICD-10-CM | POA: Diagnosis not present

## 2019-01-05 NOTE — Patient Instructions (Signed)
Medication Instructions:  Your physician recommends that you continue on your current medications as directed. Please refer to the Current Medication list given to you today.   If you need a refill on your cardiac medications before your next appointment, please call your pharmacy.   Lab work: Your physician recommends that you have a BMP, lipid and hepatic drawn today If you have labs (blood work) drawn today and your tests are completely normal, you will receive your results only by: Marland Kitchen MyChart Message (if you have MyChart) OR . A paper copy in the mail If you have any lab test that is abnormal or we need to change your treatment, we will call you to review the results.  Testing/Procedures: You had an EKG performed today.  Follow-Up: At [facility], you and your health needs are our priority.  As part of our continuing mission to provide you with exceptional heart care, we have created designated Provider Care Teams.  These Care Teams include your primary Cardiologist (physician) and Advanced Practice Providers (APPs -  Physician Assistants and Nurse Practitioners) who all work together to provide you with the care you need, when you need it. You will need a follow up appointment in 6 months.

## 2019-01-05 NOTE — Progress Notes (Signed)
Cardiology Office Note:    Date:  01/05/2019   ID:  QUINLEY JESKO, DOB 1960/11/21, MRN [id number]  PCP:  Serita Grammes, MD  Cardiologist:  Jenean Lindau, MD   Referring MD: Serita Grammes, MD    ASSESSMENT:    1. Mixed dyslipidemia    PLAN:    In order of problems listed above:  1. EKG done today reveals sinus tachycardia otherwise unremarkable. 2. Patient has mixed dyslipidemia.  I will get her back in the next few days for blood work and assess this.  Diet was discussed with her at length. 3. Results of stress test discussed with the patient at extensive length.  Importance of regular activity stressed 4. Patient will be seen in follow-up appointment in 6 months or earlier if the patient has any concerns    Medication Adjustments/Labs and Tests Ordered: Current medicines are reviewed at length with the patient today.  Concerns regarding medicines are outlined above.  No orders of the defined types were placed in this encounter.  No orders of the defined types were placed in this encounter.    Chief Complaint  Patient presents with  . Irregular Heart Beat     History of Present Illness:    Lindsay Erickson is a 58 y.o. female.  Patient has history of essential tremor and mixed dyslipidemia.  Her stress test was unremarkable.  She denies any chest pain orthopnea or PND.  She is now on statin therapy.  She mentions to me that her lipids need to be checked.  She is following diet strictly.  Past Medical History:  Diagnosis Date  . Acid reflux   . Anxiety   . Constipation   . Migraine   . Seizure disorder [facility])     Past Surgical History:  Procedure Laterality Date  . ABDOMINAL HYSTERECTOMY    . BREAST BIOPSY Left    X3  . CATARACT EXTRACTION Bilateral     Current Medications: Current Meds  Medication Sig  . divalproex (DEPAKOTE ER) 500 MG 24 hr tablet Take 1 tablet by mouth 2 (two) times daily.  Marland Kitchen escitalopram (LEXAPRO) 5 MG tablet Take 1  tablet by mouth daily.  . simvastatin (ZOCOR) 10 MG tablet Take 10 mg by mouth daily.     Allergies:   Gabapentin, Primidone, Propranolol, Venlafaxine, and Codeine   Social History   Socioeconomic History  . Marital status: Married    Spouse name: Not on file  . Number of children: Not on file  . Years of education: Not on file  . Highest education level: Not on file  Occupational History  . Not on file  Social Needs  . Financial resource strain: Not on file  . Food insecurity    Worry: Not on file    Inability: Not on file  . Transportation needs    Medical: Not on file    Non-medical: Not on file  Tobacco Use  . Smoking status: Current Every Day Smoker  . Smokeless tobacco: Never Used  Substance and Sexual Activity  . Alcohol use: Not on file  . Drug use: Not on file  . Sexual activity: Not on file  Lifestyle  . Physical activity    Days per week: Not on file    Minutes per session: Not on file  . Stress: Not on file  Relationships  . Social Herbalist on phone: Not on file    Gets together: Not on file  Attends religious service: Not on file    Active member of club or organization: Not on file    Attends meetings of clubs or organizations: Not on file    Relationship status: Not on file  Other Topics Concern  . Not on file  Social History Narrative  . Not on file     Family History: The patient's family history includes Atrial fibrillation in her sister; Diabetes in her brother and maternal grandmother; Heart attack in her maternal uncle and maternal uncle; Heart disease in her father and mother; Stroke in her mother.  ROS:   Please see the history of present illness.    All other systems reviewed and are negative.  EKGs/Labs/Other Studies Reviewed:    The following studies were reviewed today: Study Highlights   The left ventricular ejection fraction is hyperdynamic (>65%).  Nuclear stress EF: 78%.  Blood pressure demonstrated a  normal response to exercise.  There was no ST segment deviation noted during stress.  The study is normal.  This is a low risk study.  No evidence of ischemia or MI.  Normal LVEF.        Recent Labs: No results found for requested labs within last 8760 hours.  Recent Lipid Panel No results found for: CHOL, TRIG, HDL, CHOLHDL, VLDL, LDLCALC, LDLDIRECT  Physical Exam:    VS:  BP 120/70 (BP Location: Left Arm, Patient Position: Sitting, Cuff Size: Normal)   Pulse (!) 104   Ht 5\' 3"  (1.6 m)   Wt 123 lb (55.8 kg)   SpO2 97%   BMI 21.79 kg/m     Wt Readings from Last 3 Encounters:  01/05/19 123 lb (55.8 kg)  07/20/18 127 lb (57.6 kg)  06/22/18 127 lb (57.6 kg)     GEN: Patient is in no acute distress HEENT: Normal NECK: No JVD; No carotid bruits LYMPHATICS: No lymphadenopathy CARDIAC: Hear sounds regular, 2/6 systolic murmur at the apex. RESPIRATORY:  Clear to auscultation without rales, wheezing or rhonchi  ABDOMEN: Soft, non-tender, non-distended MUSCULOSKELETAL:  No edema; No deformity  SKIN: Warm and dry NEUROLOGIC:  Alert and oriented x 3 PSYCHIATRIC:  Normal affect   Signed, Jenean Lindau, MD  01/05/2019 3:07 PM    [facility]

## 2019-01-06 DIAGNOSIS — E782 Mixed hyperlipidemia: Secondary | ICD-10-CM | POA: Diagnosis not present

## 2019-01-06 LAB — HEPATIC FUNCTION PANEL
ALT: 5 IU/L (ref 0–32)
AST: 16 IU/L (ref 0–40)
Albumin: 4.7 g/dL (ref 3.8–4.9)
Alkaline Phosphatase: 92 IU/L (ref 39–117)
Bilirubin Total: <0.2 mg/dL (ref 0.0–1.2)
Bilirubin, Direct: 0.06 mg/dL (ref 0.00–0.40)
Total Protein: 7.1 g/dL (ref 6.0–8.5)

## 2019-01-06 LAB — LIPID PANEL
Chol/HDL Ratio: 4.4 ratio (ref 0.0–4.4)
Cholesterol, Total: 221 mg/dL — ABNORMAL HIGH (ref 100–199)
HDL: 50 mg/dL (ref >39)
LDL Chol Calc (NIH): 136 mg/dL — ABNORMAL HIGH (ref 0–99)
Triglycerides: 198 mg/dL — ABNORMAL HIGH (ref 0–149)
VLDL Cholesterol Cal: 35 mg/dL (ref 5–40)

## 2019-01-06 LAB — BASIC METABOLIC PANEL
BUN/Creatinine Ratio: 33 — ABNORMAL HIGH (ref 12–28)
BUN: 29 mg/dL — ABNORMAL HIGH (ref 8–27)
CO2: 20 mmol/L (ref 20–29)
Calcium: 9.5 mg/dL (ref 8.7–10.3)
Chloride: 103 mmol/L (ref 96–106)
Creatinine, Ser: 0.89 mg/dL (ref 0.57–1.00)
GFR calc Af Amer: 81 mL/min/{1.73_m2} (ref >59)
GFR calc non Af Amer: 71 mL/min/{1.73_m2} (ref >59)
Glucose: 97 mg/dL (ref 65–99)
Potassium: 4.5 mmol/L (ref 3.5–5.2)
Sodium: 139 mmol/L (ref 134–144)

## 2019-01-13 ENCOUNTER — Telehealth: Payer: Self-pay

## 2019-01-13 DIAGNOSIS — E782 Mixed hyperlipidemia: Secondary | ICD-10-CM

## 2019-01-13 MED ORDER — ROSUVASTATIN CALCIUM 10 MG PO TABS: 10.0000 mg | ORAL_TABLET | Freq: Every day | ORAL | 3 refills | Status: DC

## 2019-01-13 NOTE — Telephone Encounter (Signed)
Patient in agreement with starting rosuvastatin script sent to [facility]. She will come into office end of Oct for repeat labs fasting. No further questions.

## 2019-01-13 NOTE — Addendum Note (Signed)
Addended by: Beckey Rutter on: 01/13/2019 05:04 PM   Modules accepted: Orders

## 2019-01-13 NOTE — Telephone Encounter (Signed)
Left message for patient to call back for results.  

## 2019-01-13 NOTE — Telephone Encounter (Signed)
-----   Message from Jenean Lindau, MD sent at 01/07/2019  8:09 AM EDT ----- Diet strictly low in fatty foods and fried foods and low carbohydrates which is predominantly refined sugars.  Change medication from simvastatin to rosuvastatin 10 mg daily and liver lipid check in 6 weeks Jenean Lindau, MD 01/07/2019 8:08 AM

## 2019-01-17 DIAGNOSIS — Z23 Encounter for immunization: Secondary | ICD-10-CM | POA: Diagnosis not present

## 2019-01-17 DIAGNOSIS — F17210 Nicotine dependence, cigarettes, uncomplicated: Secondary | ICD-10-CM | POA: Diagnosis not present

## 2019-01-17 DIAGNOSIS — Z6821 Body mass index (BMI) 21.0-21.9, adult: Secondary | ICD-10-CM | POA: Diagnosis not present

## 2019-01-17 DIAGNOSIS — E785 Hyperlipidemia, unspecified: Secondary | ICD-10-CM | POA: Diagnosis not present

## 2019-01-17 DIAGNOSIS — F419 Anxiety disorder, unspecified: Secondary | ICD-10-CM | POA: Diagnosis not present

## 2019-02-02 DIAGNOSIS — F419 Anxiety disorder, unspecified: Secondary | ICD-10-CM | POA: Diagnosis not present

## 2019-02-02 DIAGNOSIS — J302 Other seasonal allergic rhinitis: Secondary | ICD-10-CM | POA: Diagnosis not present

## 2019-02-02 DIAGNOSIS — G40909 Epilepsy, unspecified, not intractable, without status epilepticus: Secondary | ICD-10-CM | POA: Diagnosis not present

## 2019-02-25 DIAGNOSIS — E782 Mixed hyperlipidemia: Secondary | ICD-10-CM | POA: Diagnosis not present

## 2019-02-25 LAB — HEPATIC FUNCTION PANEL
ALT: 8 IU/L (ref 0–32)
AST: 16 IU/L (ref 0–40)
Albumin: 4.4 g/dL (ref 3.8–4.9)
Alkaline Phosphatase: 91 IU/L (ref 39–117)
Bilirubin Total: <0.2 mg/dL (ref 0.0–1.2)
Bilirubin, Direct: <0.02 mg/dL (ref 0.00–0.40)
Total Protein: 7 g/dL (ref 6.0–8.5)

## 2019-02-25 LAB — LIPID PANEL
Chol/HDL Ratio: 2.9 ratio (ref 0.0–4.4)
Cholesterol, Total: 151 mg/dL (ref 100–199)
HDL: 52 mg/dL (ref >39)
LDL Chol Calc (NIH): 78 mg/dL (ref 0–99)
Triglycerides: 118 mg/dL (ref 0–149)
VLDL Cholesterol Cal: 21 mg/dL (ref 5–40)

## 2019-03-03 ENCOUNTER — Telehealth: Payer: Self-pay

## 2019-03-03 NOTE — Telephone Encounter (Signed)
-----   Message from Jenean Lindau, MD sent at 02/25/2019  5:30 PM EDT ----- The results of the study is unremarkable. Please inform patient. I will discuss in detail at next appointment. Cc  primary care/referring physician Jenean Lindau, MD 02/25/2019 5:30 PM

## 2019-03-03 NOTE — Telephone Encounter (Signed)
Results relayed, copy sent to Dr. Jeryl Columbia.

## 2019-03-04 DIAGNOSIS — E785 Hyperlipidemia, unspecified: Secondary | ICD-10-CM | POA: Diagnosis not present

## 2019-03-04 DIAGNOSIS — F419 Anxiety disorder, unspecified: Secondary | ICD-10-CM | POA: Diagnosis not present

## 2019-03-08 DIAGNOSIS — Z20828 Contact with and (suspected) exposure to other viral communicable diseases: Secondary | ICD-10-CM | POA: Diagnosis not present

## 2019-03-21 DIAGNOSIS — R7611 Nonspecific reaction to tuberculin skin test without active tuberculosis: Secondary | ICD-10-CM | POA: Diagnosis not present

## 2019-03-21 DIAGNOSIS — U071 COVID-19: Secondary | ICD-10-CM | POA: Diagnosis not present

## 2019-03-21 DIAGNOSIS — R251 Tremor, unspecified: Secondary | ICD-10-CM | POA: Diagnosis not present

## 2019-03-21 DIAGNOSIS — R1310 Dysphagia, unspecified: Secondary | ICD-10-CM | POA: Diagnosis not present

## 2019-03-21 DIAGNOSIS — J329 Chronic sinusitis, unspecified: Secondary | ICD-10-CM | POA: Diagnosis not present

## 2019-03-21 DIAGNOSIS — J324 Chronic pansinusitis: Secondary | ICD-10-CM | POA: Diagnosis not present

## 2019-04-04 DIAGNOSIS — F419 Anxiety disorder, unspecified: Secondary | ICD-10-CM | POA: Diagnosis not present

## 2019-04-04 DIAGNOSIS — K219 Gastro-esophageal reflux disease without esophagitis: Secondary | ICD-10-CM | POA: Diagnosis not present

## 2019-04-04 DIAGNOSIS — E785 Hyperlipidemia, unspecified: Secondary | ICD-10-CM | POA: Diagnosis not present

## 2019-04-05 ENCOUNTER — Other Ambulatory Visit: Payer: Self-pay | Admitting: Cardiology

## 2019-04-06 MED ORDER — ROSUVASTATIN CALCIUM 10 MG PO TABS: 10.0000 mg | ORAL_TABLET | Freq: Every day | ORAL | 3 refills | Status: DC

## 2019-04-13 DIAGNOSIS — R1310 Dysphagia, unspecified: Secondary | ICD-10-CM | POA: Diagnosis not present

## 2019-04-13 DIAGNOSIS — U071 COVID-19: Secondary | ICD-10-CM | POA: Diagnosis not present

## 2019-04-13 DIAGNOSIS — H70003 Acute mastoiditis without complications, bilateral: Secondary | ICD-10-CM | POA: Diagnosis not present

## 2019-04-13 DIAGNOSIS — G250 Essential tremor: Secondary | ICD-10-CM | POA: Diagnosis not present

## 2019-04-13 DIAGNOSIS — Z6822 Body mass index (BMI) 22.0-22.9, adult: Secondary | ICD-10-CM | POA: Diagnosis not present

## 2019-04-18 DIAGNOSIS — R1310 Dysphagia, unspecified: Secondary | ICD-10-CM | POA: Diagnosis not present

## 2019-04-18 DIAGNOSIS — G250 Essential tremor: Secondary | ICD-10-CM | POA: Diagnosis not present

## 2019-04-18 DIAGNOSIS — Z6822 Body mass index (BMI) 22.0-22.9, adult: Secondary | ICD-10-CM | POA: Diagnosis not present

## 2019-04-20 DIAGNOSIS — G250 Essential tremor: Secondary | ICD-10-CM | POA: Diagnosis not present

## 2019-04-20 DIAGNOSIS — G43009 Migraine without aura, not intractable, without status migrainosus: Secondary | ICD-10-CM | POA: Diagnosis not present

## 2019-04-20 DIAGNOSIS — G40209 Localization-related (focal) (partial) symptomatic epilepsy and epileptic syndromes with complex partial seizures, not intractable, without status epilepticus: Secondary | ICD-10-CM | POA: Diagnosis not present

## 2019-05-02 DIAGNOSIS — Z6826 Body mass index (BMI) 26.0-26.9, adult: Secondary | ICD-10-CM | POA: Diagnosis not present

## 2019-05-02 DIAGNOSIS — G250 Essential tremor: Secondary | ICD-10-CM | POA: Diagnosis not present

## 2019-05-02 DIAGNOSIS — U071 COVID-19: Secondary | ICD-10-CM | POA: Diagnosis not present

## 2019-05-02 DIAGNOSIS — R1310 Dysphagia, unspecified: Secondary | ICD-10-CM | POA: Diagnosis not present

## 2019-05-05 DIAGNOSIS — G250 Essential tremor: Secondary | ICD-10-CM | POA: Diagnosis not present

## 2019-05-05 DIAGNOSIS — U071 COVID-19: Secondary | ICD-10-CM | POA: Diagnosis not present

## 2019-05-26 DIAGNOSIS — G43009 Migraine without aura, not intractable, without status migrainosus: Secondary | ICD-10-CM | POA: Diagnosis not present

## 2019-05-26 DIAGNOSIS — R413 Other amnesia: Secondary | ICD-10-CM | POA: Diagnosis not present

## 2019-05-26 DIAGNOSIS — G40209 Localization-related (focal) (partial) symptomatic epilepsy and epileptic syndromes with complex partial seizures, not intractable, without status epilepticus: Secondary | ICD-10-CM | POA: Diagnosis not present

## 2019-05-26 DIAGNOSIS — G250 Essential tremor: Secondary | ICD-10-CM | POA: Diagnosis not present

## 2019-06-04 DIAGNOSIS — E785 Hyperlipidemia, unspecified: Secondary | ICD-10-CM | POA: Diagnosis not present

## 2019-06-04 DIAGNOSIS — F419 Anxiety disorder, unspecified: Secondary | ICD-10-CM | POA: Diagnosis not present

## 2019-06-04 DIAGNOSIS — J302 Other seasonal allergic rhinitis: Secondary | ICD-10-CM | POA: Diagnosis not present

## 2019-06-16 DIAGNOSIS — G250 Essential tremor: Secondary | ICD-10-CM | POA: Diagnosis not present

## 2019-06-16 DIAGNOSIS — G43009 Migraine without aura, not intractable, without status migrainosus: Secondary | ICD-10-CM | POA: Diagnosis not present

## 2019-06-16 DIAGNOSIS — R413 Other amnesia: Secondary | ICD-10-CM | POA: Diagnosis not present

## 2019-06-16 DIAGNOSIS — G40209 Localization-related (focal) (partial) symptomatic epilepsy and epileptic syndromes with complex partial seizures, not intractable, without status epilepticus: Secondary | ICD-10-CM | POA: Diagnosis not present

## 2019-06-27 DIAGNOSIS — R1310 Dysphagia, unspecified: Secondary | ICD-10-CM | POA: Diagnosis not present

## 2019-06-27 DIAGNOSIS — G250 Essential tremor: Secondary | ICD-10-CM | POA: Diagnosis not present

## 2019-06-27 DIAGNOSIS — B948 Sequelae of other specified infectious and parasitic diseases: Secondary | ICD-10-CM | POA: Diagnosis not present

## 2019-06-27 DIAGNOSIS — Z6822 Body mass index (BMI) 22.0-22.9, adult: Secondary | ICD-10-CM | POA: Diagnosis not present

## 2019-07-03 DIAGNOSIS — G40909 Epilepsy, unspecified, not intractable, without status epilepticus: Secondary | ICD-10-CM | POA: Diagnosis not present

## 2019-07-03 DIAGNOSIS — F419 Anxiety disorder, unspecified: Secondary | ICD-10-CM | POA: Diagnosis not present

## 2019-07-12 DIAGNOSIS — G250 Essential tremor: Secondary | ICD-10-CM | POA: Diagnosis not present

## 2019-07-12 DIAGNOSIS — Z6822 Body mass index (BMI) 22.0-22.9, adult: Secondary | ICD-10-CM | POA: Diagnosis not present

## 2019-07-12 DIAGNOSIS — Z79899 Other long term (current) drug therapy: Secondary | ICD-10-CM | POA: Diagnosis not present

## 2019-07-26 ENCOUNTER — Encounter: Payer: Self-pay | Admitting: Neurology

## 2019-07-27 DIAGNOSIS — G40909 Epilepsy, unspecified, not intractable, without status epilepticus: Secondary | ICD-10-CM | POA: Diagnosis not present

## 2019-07-27 DIAGNOSIS — Z6822 Body mass index (BMI) 22.0-22.9, adult: Secondary | ICD-10-CM | POA: Diagnosis not present

## 2019-07-27 DIAGNOSIS — R569 Unspecified convulsions: Secondary | ICD-10-CM | POA: Diagnosis not present

## 2019-07-27 DIAGNOSIS — G250 Essential tremor: Secondary | ICD-10-CM | POA: Diagnosis not present

## 2019-07-27 DIAGNOSIS — B948 Sequelae of other specified infectious and parasitic diseases: Secondary | ICD-10-CM | POA: Diagnosis not present

## 2019-07-27 DIAGNOSIS — R55 Syncope and collapse: Secondary | ICD-10-CM | POA: Diagnosis not present

## 2019-08-02 DIAGNOSIS — Z0000 Encounter for general adult medical examination without abnormal findings: Secondary | ICD-10-CM | POA: Diagnosis not present

## 2019-08-02 DIAGNOSIS — Z1331 Encounter for screening for depression: Secondary | ICD-10-CM | POA: Diagnosis not present

## 2019-08-02 DIAGNOSIS — Z6823 Body mass index (BMI) 23.0-23.9, adult: Secondary | ICD-10-CM | POA: Diagnosis not present

## 2019-08-02 DIAGNOSIS — G250 Essential tremor: Secondary | ICD-10-CM | POA: Diagnosis not present

## 2019-08-02 DIAGNOSIS — Z1211 Encounter for screening for malignant neoplasm of colon: Secondary | ICD-10-CM | POA: Diagnosis not present

## 2019-08-02 DIAGNOSIS — G40909 Epilepsy, unspecified, not intractable, without status epilepticus: Secondary | ICD-10-CM | POA: Diagnosis not present

## 2019-08-02 DIAGNOSIS — M7989 Other specified soft tissue disorders: Secondary | ICD-10-CM | POA: Diagnosis not present

## 2019-08-02 DIAGNOSIS — Z1239 Encounter for other screening for malignant neoplasm of breast: Secondary | ICD-10-CM | POA: Diagnosis not present

## 2019-08-02 DIAGNOSIS — E785 Hyperlipidemia, unspecified: Secondary | ICD-10-CM | POA: Diagnosis not present

## 2019-08-03 DIAGNOSIS — R930 Abnormal findings on diagnostic imaging of skull and head, not elsewhere classified: Secondary | ICD-10-CM | POA: Diagnosis not present

## 2019-08-03 DIAGNOSIS — R569 Unspecified convulsions: Secondary | ICD-10-CM | POA: Diagnosis not present

## 2019-08-03 DIAGNOSIS — R55 Syncope and collapse: Secondary | ICD-10-CM | POA: Diagnosis not present

## 2019-08-03 DIAGNOSIS — G40909 Epilepsy, unspecified, not intractable, without status epilepticus: Secondary | ICD-10-CM | POA: Diagnosis not present

## 2019-08-03 DIAGNOSIS — J321 Chronic frontal sinusitis: Secondary | ICD-10-CM | POA: Diagnosis not present

## 2019-08-04 DIAGNOSIS — I728 Aneurysm of other specified arteries: Secondary | ICD-10-CM | POA: Diagnosis not present

## 2019-08-04 DIAGNOSIS — I671 Cerebral aneurysm, nonruptured: Secondary | ICD-10-CM | POA: Diagnosis not present

## 2019-08-04 DIAGNOSIS — I720 Aneurysm of carotid artery: Secondary | ICD-10-CM | POA: Diagnosis not present

## 2019-08-04 DIAGNOSIS — R930 Abnormal findings on diagnostic imaging of skull and head, not elsewhere classified: Secondary | ICD-10-CM | POA: Diagnosis not present

## 2019-08-17 DIAGNOSIS — H26491 Other secondary cataract, right eye: Secondary | ICD-10-CM | POA: Diagnosis not present

## 2019-08-17 DIAGNOSIS — H524 Presbyopia: Secondary | ICD-10-CM | POA: Diagnosis not present

## 2019-08-17 DIAGNOSIS — H52202 Unspecified astigmatism, left eye: Secondary | ICD-10-CM | POA: Diagnosis not present

## 2019-08-17 DIAGNOSIS — Z961 Presence of intraocular lens: Secondary | ICD-10-CM | POA: Diagnosis not present

## 2019-08-22 ENCOUNTER — Telehealth: Payer: Self-pay

## 2019-08-22 NOTE — Telephone Encounter (Signed)
Appt Tat 08/23/19 was canceled by Tat for not treating pt diagnosis.Pt said Tat canceled stating tremors came from medication for epilepcy. Familous tremos inherited tremor diagnosis.  Both parents had diagnosis. Pt states testing previously confirmed diagnosis. Pt states originally treated by Dr. Charlaine Dalton but he is no longer local he is in [place]. New neurology Abram Sander MD pt was not satisfied with & pt requested refer to [facility]. Pt request review records again for inhereted  tremors not related to epilepsy. Please call pt [phone].

## 2019-08-22 NOTE — Telephone Encounter (Signed)
Spoke with the referring providers office we will wait until Lindsay Erickson gets back to see if that is something that she will see

## 2019-08-23 ENCOUNTER — Ambulatory Visit: Payer: Medicare HMO | Admitting: Neurology

## 2019-08-23 NOTE — Telephone Encounter (Signed)
Ok thanks 

## 2019-08-30 DIAGNOSIS — Z1231 Encounter for screening mammogram for malignant neoplasm of breast: Secondary | ICD-10-CM | POA: Diagnosis not present

## 2019-09-07 ENCOUNTER — Other Ambulatory Visit: Payer: Self-pay

## 2019-09-07 ENCOUNTER — Ambulatory Visit: Payer: Medicare HMO | Admitting: Neurology

## 2019-09-07 ENCOUNTER — Encounter: Payer: Self-pay | Admitting: Neurology

## 2019-09-07 VITALS — BP 114/73 | HR 90 | Resp 20 | Ht 62.5 in | Wt 133.0 lb

## 2019-09-07 DIAGNOSIS — G40009 Localization-related (focal) (partial) idiopathic epilepsy and epileptic syndromes with seizures of localized onset, not intractable, without status epilepticus: Secondary | ICD-10-CM

## 2019-09-07 DIAGNOSIS — R251 Tremor, unspecified: Secondary | ICD-10-CM | POA: Diagnosis not present

## 2019-09-07 DIAGNOSIS — I671 Cerebral aneurysm, nonruptured: Secondary | ICD-10-CM
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
## 2019-09-07 NOTE — Patient Instructions (Signed)
1. Reduce Depakote 500mg : take 1 tablet every night  2. Schedule 1-hour EEG, then 72-hour EEG  3. Refer to Interventional Radiology (Dr. Estanislado Pandy) for aneurysms  4. Follow-up in 3 months, call for any changes

## 2019-09-09 DIAGNOSIS — G250 Essential tremor: Secondary | ICD-10-CM | POA: Diagnosis not present

## 2019-09-09 DIAGNOSIS — Z6823 Body mass index (BMI) 23.0-23.9, adult: Secondary | ICD-10-CM | POA: Diagnosis not present

## 2019-09-09 DIAGNOSIS — F419 Anxiety disorder, unspecified: Secondary | ICD-10-CM | POA: Diagnosis not present

## 2019-09-14 ENCOUNTER — Ambulatory Visit (INDEPENDENT_AMBULATORY_CARE_PROVIDER_SITE_OTHER): Payer: Medicare HMO | Admitting: Neurology

## 2019-09-14 ENCOUNTER — Other Ambulatory Visit: Payer: Self-pay

## 2019-09-14 DIAGNOSIS — R251 Tremor, unspecified: Secondary | ICD-10-CM | POA: Diagnosis not present

## 2019-09-14 DIAGNOSIS — I671 Cerebral aneurysm, nonruptured: Secondary | ICD-10-CM

## 2019-09-14 DIAGNOSIS — G40009 Localization-related (focal) (partial) idiopathic epilepsy and epileptic syndromes with seizures of localized onset, not intractable, without status epilepticus: Secondary | ICD-10-CM

## 2019-09-19 ENCOUNTER — Telehealth: Payer: Self-pay | Admitting: Neurology

## 2019-09-19 NOTE — Telephone Encounter (Signed)
Called patient an advised of EEG

## 2019-09-19 NOTE — Procedures (Signed)
ELECTROENCEPHALOGRAM REPORT  Date of Study: 09/14/2019  Patient's Name: Lindsay Erickson MRN: [id number] Date of Birth: 12-27-1960  Referring Provider: Dr. [name]  Clinical History: This is a 59 year old woman with a history of essential tremor and seizures since childhood, with recurrent episodes of dizziness. EEG for classification.  Medications: DEPAKOTE ER 500 MG 24 hr tablet CRESTOR 10 MG tablet LEXAPRO 5 MG tablet  Technical Summary: A multichannel digital 1-hour EEG recording measured by the international 10-20 system with electrodes applied with paste and impedances below 5000 ohms performed in our laboratory with EKG monitoring in an awake and asleep patient.  Hyperventilation was not performed. Photic stimulation was performed.  The digital EEG was referentially recorded, reformatted, and digitally filtered in a variety of bipolar and referential montages for optimal display.    Description: The patient is awake and asleep during the recording.  During maximal wakefulness, there is a symmetric, medium voltage 8 Hz posterior dominant rhythm that attenuates with eye opening.  The record is symmetric.  During drowsiness and sleep, there is an increase in theta and delta slowing of the background with occasional vertex waves seen.  Photic stimulation did not elicit any abnormalities.  There is a 4 Hz movement/muscle artifact throughout majority of the study during wakefulness due to tremor, in between artifact and during brief drowsiness and sleep, there were no epileptiform discharges or electrographic seizures seen.    EKG lead was unremarkable.  Impression: This 1-hour awake and asleep EEG is normal, although slightly limited due to muscle/movement artifact over the bilateral temporal regions obscuring EEG during wakefulness.  Clinical Correlation: A normal EEG does not exclude a clinical diagnosis of epilepsy.  If further clinical questions remain, prolonged EEG may be  helpful.  Clinical correlation is advised.   [name], M.D.

## 2019-09-19 NOTE — Telephone Encounter (Signed)
Patient returned call for EEG results.

## 2019-10-03 DIAGNOSIS — E785 Hyperlipidemia, unspecified: Secondary | ICD-10-CM | POA: Diagnosis not present

## 2019-10-03 DIAGNOSIS — K219 Gastro-esophageal reflux disease without esophagitis: Secondary | ICD-10-CM | POA: Diagnosis not present

## 2019-12-19 ENCOUNTER — Ambulatory Visit: Payer: Medicare HMO | Admitting: Neurology

## 2019-12-19 ENCOUNTER — Encounter: Payer: Self-pay | Admitting: Neurology

## 2019-12-19 ENCOUNTER — Other Ambulatory Visit: Payer: Self-pay

## 2019-12-19 VITALS — BP 98/65 | HR 77 | Ht 63.0 in | Wt 130.0 lb

## 2019-12-19 DIAGNOSIS — G250 Essential tremor: Secondary | ICD-10-CM | POA: Diagnosis not present

## 2019-12-19 DIAGNOSIS — G40009 Localization-related (focal) (partial) idiopathic epilepsy and epileptic syndromes with seizures of localized onset, not intractable, without status epilepticus: Secondary | ICD-10-CM

## 2019-12-19 DIAGNOSIS — I671 Cerebral aneurysm, nonruptured: Secondary | ICD-10-CM

## 2019-12-19 MED ORDER — ESCITALOPRAM OXALATE 5 MG PO TABS: 5.0000 mg | ORAL_TABLET | Freq: Every day | ORAL | 3 refills | Status: DC

## 2019-12-19 MED ORDER — DIVALPROEX SODIUM ER 500 MG PO TB24: 500.0000 mg | ORAL_TABLET | Freq: Every day | ORAL | 3 refills | Status: DC
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
## 2019-12-22 NOTE — Progress Notes (Deleted)
Assessment/Plan:   1.  Essential Tremor.  -This is evidenced by the symmetrical nature and longstanding hx of gradually getting worse.  We discussed nature and pathophysiology.  We discussed that this can continue to gradually get worse with time.  We discussed that some medications can worsen this, as can caffeine use.  We discussed medication therapy as well as surgical therapy.  Ultimately, the patient decided to ***.     Subjective:   Lindsay Erickson was seen in consultation in the movement disorder clinic at the request of Cameron Sprang, MD.  The evaluation is for essential tremor and consideration for possible DBS.  I have reviewed records from Dr. Delice Lesch.  Patient is a 59 year old female with history of seizure since childhood and disabling essential tremor.  She has had tremors since the middle of the 1990s, but seemed to get worse over the last year.  Tremor is most noticeable when ***.   There is *** family hx of tremor.    Affected by caffeine:  {yes no:314532} Affected by alcohol:  {yes no:314532} Affected by stress:  {yes no:314532} Affected by fatigue:  {yes no:314532} Spills soup if on spoon:  {yes no:314532} Spills glass of liquid if full:  {yes no:314532} Affects ADL's (tying shoes, brushing teeth, etc):  {yes no:314532}  Current/Previously tried tremor medications: ***Primidone, beta-blocker, gabapentin; amantadine  Current medications that may exacerbate tremor:  ***Depakote (on for seizure) -seizure-free since 1980s.  Outside reports reviewed: {Outside review:15817}.  Patient had repeat MRI of the brain without contrast on March, 2021 with no acute change.  MRA of the brain in April, 2021 with 4 x 7 mm left periophthalmic artery aneurysm and 3 x 6 right periophthalmic artery aneurysm.  Films not available for my review.  This is per Dr. Amparo Bristol records  Allergies  Allergen Reactions  . Gabapentin Other (See Comments)    insomnia  . Primidone Other (See  Comments)    Couldn't take  . Propranolol Swelling    Can't take beta blockers  . Venlafaxine Other (See Comments)    Irritable/hyper  . Codeine Itching    Current Outpatient Medications  Medication Instructions  . divalproex (DEPAKOTE ER) 500 mg, Oral, Daily  . escitalopram (LEXAPRO) 5 mg, Oral, Daily  . rosuvastatin (CRESTOR) 10 mg, Oral, Daily     Objective:   VITALS:  There were no vitals filed for this visit. Gen:  Appears stated age and in NAD. HEENT:  Normocephalic, atraumatic. The mucous membranes are moist. The superficial temporal arteries are without ropiness or tenderness. Cardiovascular: Regular rate and rhythm. Lungs: Clear to auscultation bilaterally. Neck: There are no carotid bruits noted bilaterally.  NEUROLOGICAL:  Orientation:  The patient is alert and oriented x 3.   Cranial nerves: There is good facial symmetry. Extraocular muscles are intact and visual fields are full to confrontational testing. Speech is fluent and clear. Soft palate rises symmetrically and there is no tongue deviation. Hearing is intact to conversational tone. Tone: Tone is good throughout. Sensation: Sensation is intact to light touch touch throughout (facial, trunk, extremities). Vibration is intact at the bilateral big toe. There is no extinction with double simultaneous stimulation. There is no sensory dermatomal level identified. Coordination:  The patient has no dysdiadichokinesia or dysmetria. Motor: Strength is 5/5 in the bilateral upper and lower extremities.  Shoulder shrug is equal bilaterally.  There is no pronator drift.  There are no fasciculations noted. DTR's: Deep tendon reflexes are 2/4 at the bilateral biceps,  triceps, brachioradialis, patella and achilles.  Plantar responses are downgoing bilaterally. Gait and Station: The patient is able to ambulate without difficulty. The patient is able to heel toe walk without any difficulty. The patient is able to ambulate in a  tandem fashion. The patient is able to stand in the Romberg position.   MOVEMENT EXAM: Tremor:  There is *** tremor in the UE, noted most significantly with action.  The patient is *** able to draw Archimedes spirals without significant difficulty.  There is *** tremor at rest.  The patient is *** able to pour water from one glass to another without spilling it.  I have reviewed and interpreted the following labs independently   Chemistry      Component Value Date/Time   NA 139 01/06/2019 1022   K 4.5 01/06/2019 1022   CL 103 01/06/2019 1022   CO2 20 01/06/2019 1022   BUN 29 (H) 01/06/2019 1022   CREATININE 0.89 01/06/2019 1022      Component Value Date/Time   CALCIUM 9.5 01/06/2019 1022   ALKPHOS 91 02/25/2019 0950   AST 16 02/25/2019 0950   ALT 8 02/25/2019 0950   BILITOT <0.2 02/25/2019 0950      No results found for: WBC, HGB, HCT, MCV, PLT No results found for: TSH    Total time spent on today's visit was ***60 minutes, including both face-to-face time and nonface-to-face time.  Time included that spent on review of records (prior notes available to me/labs/imaging if pertinent), discussing treatment and goals, answering patient's questions and coordinating care.  CC:  Serita Grammes, MD

## 2019-12-26 ENCOUNTER — Telehealth: Payer: Self-pay | Admitting: Neurology

## 2019-12-26 ENCOUNTER — Other Ambulatory Visit (HOSPITAL_COMMUNITY): Payer: Self-pay | Admitting: Interventional Radiology

## 2019-12-26 ENCOUNTER — Ambulatory Visit: Payer: Medicare HMO | Admitting: Neurology

## 2019-12-26 DIAGNOSIS — I671 Cerebral aneurysm, nonruptured: Secondary | ICD-10-CM

## 2019-12-26 NOTE — Telephone Encounter (Signed)
Patient called in to let Dr. Delice Lesch know she will be seeing the neuro surgeon you referred her to on Thursday.

## 2019-12-29 ENCOUNTER — Other Ambulatory Visit: Payer: Self-pay

## 2019-12-29 ENCOUNTER — Ambulatory Visit (HOSPITAL_COMMUNITY)
Admission: RE | Admit: 2019-12-29 | Discharge: 2019-12-29 | Disposition: A | Discharge status: Home / Self Care | Payer: Medicare HMO | Source: Ambulatory Visit | Attending: Interventional Radiology | Admitting: Interventional Radiology

## 2019-12-29 ENCOUNTER — Telehealth: Payer: Self-pay | Admitting: Neurology

## 2019-12-29 DIAGNOSIS — Z8249 Family history of ischemic heart disease and other diseases of the circulatory system: Secondary | ICD-10-CM | POA: Diagnosis not present

## 2019-12-29 DIAGNOSIS — I671 Cerebral aneurysm, nonruptured: Secondary | ICD-10-CM

## 2019-12-29 NOTE — Telephone Encounter (Signed)
Dr Estanislado Pandy called to speak with Dr Delice Lesch in regards to patient. Requests a call back.  Work #: [phone] Page #: [phone] Cell [phone]: [phone]

## 2019-12-29 NOTE — Telephone Encounter (Addendum)
Opened in error

## 2019-12-29 NOTE — Consult Note (Signed)
Chief Complaint: Patient was seen in consultation today for evaluation and treatment of recently discovered brain aneurysms  Referring Physician(s): Dr.Aquino MD  History of Present Illness: Lindsay Erickson is a 59 y.o. female with a past medical history as below, requested to be seen for evaluation and management of recently discovered bilateral brain aneurysms.  Patient is accompanied by her husband.  History obtained from the patient, husband, and also from the medical electronic chart..  The patient was most recently seen by Dr. Greggory Erickson on 12/19/2019.  At that visit which was for management of patient's seizures and tremors, the patient had described a history of the patient had described history of occasional headaches.  She also explained of having experienced sudden severe right eye pain lasting to 3 minutes during which time she cannot open her eyes.  This was not associated with any loss of consciousness, nausea, vomiting, seizures, motor or sensory or speech difficulties. 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  Patient reports her mother died from a ruptured brain aneurysm repair. Significant past medical history of tremors since since 1990s worsened following last year following her infection with COVID-19..  Other past medical history as mentioned earlier. Past Medical History:  Diagnosis Date  . Acid reflux   . Anxiety   . Constipation   . Migraine   . Seizure disorder [facility])     Past Surgical History:  Procedure Laterality Date  . ABDOMINAL HYSTERECTOMY    . BREAST BIOPSY Left    X3  . CATARACT EXTRACTION Bilateral     Allergies: Gabapentin, Primidone, Propranolol, Venlafaxine, and Codeine  Medications: Prior to Admission medications   Medication Sig Start Date End Date Taking? Authorizing Provider   divalproex (DEPAKOTE ER) 500 MG 24 hr tablet Take 1 tablet (500 mg total) by mouth daily. 12/19/19   Cameron Sprang, MD  escitalopram (LEXAPRO) 5 MG tablet Take 1 tablet (5 mg total) by mouth daily. 12/19/19   Cameron Sprang, MD  rosuvastatin (CRESTOR) 10 MG tablet Take 1 tablet (10 mg total) by mouth daily. 04/06/19 12/19/19  Revankar, Reita Cliche, MD     Family History  Problem Relation Age of Onset  . Heart disease Mother   . Stroke Mother   . Heart disease Father   . Atrial fibrillation Sister   . Diabetes Brother   . Diabetes Maternal Grandmother   . Heart attack Maternal Uncle   . Heart attack Maternal Uncle     Social History   Socioeconomic History  . Marital status: Married    Spouse name: Not on file  . Number of children: Not on file  . Years of education: Not on file  . Highest education level: Not on file  Occupational History  . Not on file  Tobacco Use  . Smoking status: Current Every Day Smoker  . Smokeless tobacco: Never Used  Vaping Use  . Vaping Use: Never used  Substance and Sexual Activity  . Alcohol use: Never  . Drug use: Never  . Sexual activity: Not on file  Other Topics Concern  . Not on file  Social History Narrative   One story home mobile home   Right handed   Drinks caffeine on occasion   Social Determinants of Health   Financial Resource Strain:   .  Difficulty of Paying Living Expenses: Not on file  Food Insecurity:   . Worried About Charity fundraiser in the Last Year: Not on file  . Ran Out of Food in the Last Year: Not on file  Transportation Needs:   . Lack of Transportation (Medical): Not on file  . Lack of Transportation (Non-Medical): Not on file  Physical Activity:   . Days of Exercise per Week: Not on file  . Minutes of Exercise per Session: Not on file  Stress:   . Feeling of Stress : Not on file  Social Connections:   . Frequency of Communication with Friends and Family: Not on file  . Frequency of Social Gatherings  with Friends and Family: Not on file  . Attends Religious Services: Not on file  . Active Member of Clubs or Organizations: Not on file  . Attends Archivist Meetings: Not on file  . Marital Status: Not on file     Review of Systems: A 12 point ROS discussed and pertinent positives are indicated in the HPI above.  All other systems are negative.  Review of Systems.  Negative unless as mentioned above.  Vital Signs: There were no vitals taken for this visit.  Physical Exam.  Throughout exam the patient was almost constantly fidgety with the high amplitude tremors of her upper extremity and to a lesser degree at the trunk and the lower extremities.  Intention tremor will apparently worsen with reaching out for objects.  The patient and husband report part of this is related to the anxiety of being at [employer].  Otherwise alert awake oriented to time place space.  Speech intermittently impaired related to the tremors.  No lateralizing neurological abnormalities noted otherwise Imaging: No results found.  Labs:  CBC: No results for input(s): WBC, HGB, HCT, PLT in the last 8760 hours.  COAGS: No results for input(s): INR, APTT in the last 8760 hours.  BMP: Recent Labs    01/06/19 1022  NA 139  K 4.5  CL 103  CO2 20  GLUCOSE 97  BUN 29*  CALCIUM 9.5  CREATININE 0.89  GFRNONAA 71  GFRAA 81    LIVER FUNCTION TESTS: Recent Labs    01/06/19 1022 02/25/19 0950  BILITOT <0.2 <0.2  AST 16 16  ALT 5 8  ALKPHOS 92 91  PROT 7.1 7.0  ALBUMIN 4.7 4.4    TUMOR MARKERS: No results for input(s): AFPTM, CEA, CA199, CHROMGRNA in the last 8760 hours.  Assessment and Plan:  Patient words were aware of the intracranial brain aneurysms.  She and her husband from healthy morphology and natural history of unruptured brain aneurysms.  These were seen on the MRI/MRA f of the brain performed to evaluate the reason for her worsening seizures and her tremors.  The risk  of rupture of brain aneurysms was estimated at 2 %/year prior aneurysm.  The mortality and morbidity of ruptured brain aneurysms are reviewed also.  The risk of rupture was increased and patient with a family history of ruptured or unruptured brain aneurysms, female gender, hypertension, smoking, possibly arteriosclerotic external disease, and use of drugs such as cocaine, and amphetamines and possible marijuana.  Hemodynamic changes within the aneurysm related to blood flow was discussed.  Options of management were closed off continued close medical surveillance versus consideration of exclusion of aneurysms from the circulation to lessen and eliminate the risk of  rupture.  The endovascular treatment options were reviewed in detail.  The  procedures, t the risks of thromboembolic stroke, he delayed rupture and intraoperative rupture with potential for death were also reviewed in detail. That initial step will be to further clarify of the aorta dates of the aneurysms with a diagnostic cath arteriogram.  This procedure, would entail placement of a catheter either through the wrist all the groin and taking pictures in the brain.  Further management in terms of treatment will be dictated by the affirmation obtained from family at diagnosis arteriograms.  The treatment option would be under general anesthesia with overnight stay in the neuro ICU to monitor the neurological function, and other parameters such as blood pressure and use of IV heparin.  Both the patient and the spouse would like to proceed with the initial diagnostic cath arteriogram and subsequently endovascular treatment depending on the findings on diagnostic arteriogram.  Informed patient that our schedulers will call to schedule diagnostic cath arteriogram of the cerebral vasculature as soon as possible. All questions answered and concerns addressed. Patient conveys understanding and agrees with plan.  Thank you for this interesting  consult.  I greatly enjoyed meeting Lindsay Erickson and look forward to participating in their care.  A copy of this report was sent to the requesting provider on this date.  Electronically Signed: Rob Hickman, MD 12/29/2019, 2:22 PM   I spent a total 50 minutes in face-to-facein clinical consultation, greater than 50% of which was counseling/coordinating care for discussing the reasons and the procedure of diagnostic cath arteriogram, and also discussing the endovascular, treatment options.  Also discussed was the post treatment care.

## 2019-12-30 NOTE — Telephone Encounter (Signed)
Spoke to Dr. Arlean Hopping regarding plan for patient. Diagnostic arteriogram may have to wait due to pandemic, then after having plan from evaluation, proceed with discussion about DBS for essential tremor. Patient had been scheduled with Movement Disorders specialist Dr. [name] but patient cancelled appt.

## 2020-01-02 ENCOUNTER — Other Ambulatory Visit (HOSPITAL_COMMUNITY): Payer: Self-pay | Admitting: Interventional Radiology

## 2020-01-02 ENCOUNTER — Ambulatory Visit (HOSPITAL_COMMUNITY): Payer: Medicare HMO

## 2020-01-02 DIAGNOSIS — I671 Cerebral aneurysm, nonruptured: Secondary | ICD-10-CM

## 2020-01-03 DIAGNOSIS — E785 Hyperlipidemia, unspecified: Secondary | ICD-10-CM | POA: Diagnosis not present

## 2020-01-03 DIAGNOSIS — G250 Essential tremor: Secondary | ICD-10-CM | POA: Diagnosis not present

## 2020-01-03 DIAGNOSIS — F419 Anxiety disorder, unspecified: Secondary | ICD-10-CM | POA: Diagnosis not present

## 2020-01-11 ENCOUNTER — Other Ambulatory Visit: Payer: Self-pay | Admitting: Student

## 2020-01-12 ENCOUNTER — Encounter (HOSPITAL_COMMUNITY): Payer: Self-pay

## 2020-01-12 ENCOUNTER — Ambulatory Visit (HOSPITAL_COMMUNITY)
Admission: RE | Admit: 2020-01-12 | Discharge: 2020-01-12 | Disposition: A | Discharge status: Home / Self Care | Payer: Medicare HMO | Source: Ambulatory Visit | Attending: Interventional Radiology | Admitting: Interventional Radiology

## 2020-01-12 ENCOUNTER — Other Ambulatory Visit (HOSPITAL_COMMUNITY): Payer: Self-pay | Admitting: Interventional Radiology

## 2020-01-12 ENCOUNTER — Other Ambulatory Visit: Payer: Self-pay

## 2020-01-12 DIAGNOSIS — Z8616 Personal history of COVID-19: Secondary | ICD-10-CM | POA: Insufficient documentation

## 2020-01-12 DIAGNOSIS — I671 Cerebral aneurysm, nonruptured: Secondary | ICD-10-CM

## 2020-01-12 DIAGNOSIS — K219 Gastro-esophageal reflux disease without esophagitis: Secondary | ICD-10-CM | POA: Diagnosis not present

## 2020-01-12 DIAGNOSIS — Z885 Allergy status to narcotic agent status: Secondary | ICD-10-CM | POA: Diagnosis not present

## 2020-01-12 DIAGNOSIS — Z7982 Long term (current) use of aspirin: Secondary | ICD-10-CM | POA: Insufficient documentation

## 2020-01-12 DIAGNOSIS — Z833 Family history of diabetes mellitus: Secondary | ICD-10-CM | POA: Diagnosis not present

## 2020-01-12 DIAGNOSIS — Z90710 Acquired absence of both cervix and uterus: Secondary | ICD-10-CM | POA: Diagnosis not present

## 2020-01-12 DIAGNOSIS — Z8249 Family history of ischemic heart disease and other diseases of the circulatory system: Secondary | ICD-10-CM | POA: Insufficient documentation

## 2020-01-12 DIAGNOSIS — Z79899 Other long term (current) drug therapy: Secondary | ICD-10-CM | POA: Insufficient documentation

## 2020-01-12 DIAGNOSIS — F419 Anxiety disorder, unspecified: Secondary | ICD-10-CM | POA: Diagnosis not present

## 2020-01-12 DIAGNOSIS — F17210 Nicotine dependence, cigarettes, uncomplicated: Secondary | ICD-10-CM | POA: Diagnosis not present

## 2020-01-12 DIAGNOSIS — Z888 Allergy status to other drugs, medicaments and biological substances status: Secondary | ICD-10-CM | POA: Insufficient documentation

## 2020-01-12 DIAGNOSIS — G40909 Epilepsy, unspecified, not intractable, without status epilepticus: Secondary | ICD-10-CM | POA: Insufficient documentation

## 2020-01-12 HISTORY — PX: IR ANGIO INTRA EXTRACRAN SEL COM CAROTID INNOMINATE BILAT MOD SED: IMG5360

## 2020-01-12 HISTORY — PX: IR US GUIDE VASC ACCESS RIGHT: IMG2390

## 2020-01-12 HISTORY — PX: IR ANGIO VERTEBRAL SEL VERTEBRAL BILAT MOD SED: IMG5369

## 2020-01-12 LAB — CBC
HCT: 42.6 % (ref 36.0–46.0)
Hemoglobin: 13.5 g/dL (ref 12.0–15.0)
MCH: 32.9 pg (ref 26.0–34.0)
MCHC: 31.7 g/dL (ref 30.0–36.0)
MCV: 103.9 fL — ABNORMAL HIGH (ref 80.0–100.0)
Platelets: 229 10*3/uL (ref 150–400)
RBC: 4.1 MIL/uL (ref 3.87–5.11)
RDW: 14.5 % (ref 11.5–15.5)
WBC: 6.5 10*3/uL (ref 4.0–10.5)
nRBC: 0 % (ref 0.0–0.2)

## 2020-01-12 LAB — BASIC METABOLIC PANEL
Anion gap: 12 (ref 5–15)
BUN: 20 mg/dL (ref 8–23)
CO2: 23 mmol/L (ref 22–32)
Calcium: 9.6 mg/dL (ref 8.9–10.3)
Chloride: 106 mmol/L (ref 98–111)
Creatinine, Ser: 0.69 mg/dL (ref 0.44–1.00)
GFR calc Af Amer: >60 mL/min (ref >60)
GFR calc non Af Amer: >60 mL/min (ref >60)
Glucose, Bld: 97 mg/dL (ref 70–99)
Potassium: 3.9 mmol/L (ref 3.5–5.1)
Sodium: 141 mmol/L (ref 135–145)

## 2020-01-12 LAB — PROTIME-INR
INR: 0.9 (ref 0.8–1.2)
Prothrombin Time: 12.1 seconds (ref 11.4–15.2)

## 2020-01-12 LAB — APTT: aPTT: 29 seconds (ref 24–36)

## 2020-01-12 MED ORDER — IOHEXOL 300 MG/ML  SOLN
150.0000 mL | Freq: Once | INTRAMUSCULAR | Status: AC | PRN
  Administered 2020-01-12: 75 mL via INTRA_ARTERIAL

## 2020-01-12 MED ORDER — MIDAZOLAM HCL 2 MG/2ML IJ SOLN
INTRAMUSCULAR | Status: AC
  Filled 2020-01-12: qty 2

## 2020-01-12 MED ORDER — HEPARIN SODIUM (PORCINE) 1000 UNIT/ML IJ SOLN
INTRAMUSCULAR | Status: AC
  Filled 2020-01-12: qty 1

## 2020-01-12 MED ORDER — SODIUM CHLORIDE 0.9 % IV BOLUS
INTRAVENOUS | Status: AC | PRN
  Administered 2020-01-12: 250 mL via INTRAVENOUS

## 2020-01-12 MED ORDER — MIDAZOLAM HCL 2 MG/2ML IJ SOLN
INTRAMUSCULAR | Status: AC | PRN
  Administered 2020-01-12: 1 mg via INTRAVENOUS

## 2020-01-12 MED ORDER — VERAPAMIL HCL 2.5 MG/ML IV SOLN
INTRAVENOUS | Status: AC
  Filled 2020-01-12: qty 2

## 2020-01-12 MED ORDER — HEPARIN SODIUM (PORCINE) 1000 UNIT/ML IJ SOLN
INTRAMUSCULAR | Status: AC | PRN
  Administered 2020-01-12: 2000 [IU] via INTRA_ARTERIAL

## 2020-01-12 MED ORDER — NITROGLYCERIN 1 MG/10 ML FOR IR/CATH LAB
INTRA_ARTERIAL | Status: AC | PRN
  Administered 2020-01-12 (×2): 200 ug via INTRA_ARTERIAL

## 2020-01-12 MED ORDER — FENTANYL CITRATE (PF) 100 MCG/2ML IJ SOLN
INTRAMUSCULAR | Status: AC
  Filled 2020-01-12: qty 2

## 2020-01-12 MED ORDER — FENTANYL CITRATE (PF) 100 MCG/2ML IJ SOLN
INTRAMUSCULAR | Status: AC | PRN
  Administered 2020-01-12: 25 ug via INTRAVENOUS

## 2020-01-12 MED ORDER — VERAPAMIL HCL 2.5 MG/ML IV SOLN
INTRAVENOUS | Status: AC | PRN
  Administered 2020-01-12: 2.5 mg via INTRAVENOUS

## 2020-01-12 MED ORDER — NITROGLYCERIN 1 MG/10 ML FOR IR/CATH LAB
INTRA_ARTERIAL | Status: AC
  Filled 2020-01-12: qty 10

## 2020-01-12 MED ORDER — SODIUM CHLORIDE 0.9 % IV SOLN: INTRAVENOUS | Status: AC

## 2020-01-12 MED ORDER — LIDOCAINE HCL (PF) 1 % IJ SOLN
INTRAMUSCULAR | Status: AC | PRN
  Administered 2020-01-12: 10 mL

## 2020-01-12 MED ORDER — SODIUM CHLORIDE 0.9 % IV SOLN: INTRAVENOUS | Status: DC

## 2020-01-12 MED ORDER — LIDOCAINE HCL 1 % IJ SOLN
INTRAMUSCULAR | Status: AC
  Filled 2020-01-12: qty 20

## 2020-01-12 NOTE — Procedures (Signed)
S? 4 vessel cerebral marteriogram RT Rad approach. Findings. 1.Approx 55mm x 5.71mm Lt Paraophthalmicaneurysm 2.Approx 7.89mm x 22mm RT periophthalmic aneurysm. S.Siobahn Worsley MD

## 2020-01-12 NOTE — Discharge Instructions (Signed)
DRINK PLENTY OF FLUIDS FOR THE NEXT 2-3 DAYS.  KEEP ARM ELEVATED THE REMAINDER OF THE DAY.  Radial Site Care  This sheet gives you information about how to care for yourself after your procedure. Your health care provider may also give you more specific instructions. If you have problems or questions, contact your health care provider. What can I expect after the procedure? After the procedure, it is common to have:  Bruising and tenderness at the catheter insertion area. Follow these instructions at home: Medicines  Take over-the-counter and prescription medicines only as told by your health care provider. Insertion site care 1. Follow instructions from your health care provider about how to take care of your insertion site. Make sure you: ? Wash your hands with soap and water before you change your bandage (dressing). If soap and water are not available, use hand sanitizer. ? Change your dressing as told by your health care provider. 2. Check your insertion site every day for signs of infection. Check for: ? Redness, swelling, or pain. ? Fluid or blood. ? Pus or a bad smell. ? Warmth. 3. Do not take baths, swim, or use a hot tub for 5 days. 4. You may shower 24-48 hours after the procedure. ? Remove the dressing and gently wash the site with plain soap and water. ? Pat the area dry with a clean towel. ? Do not rub the site. That could cause bleeding. 5. Do not apply powder or lotion to the site. Activity  1. For 24 hours after the procedure, or as directed by your health care provider: ? Do not flex or bend the affected arm. ? Do not push or pull heavy objects with the affected arm. ? Do not drive yourself home from the hospital or clinic. You may drive 24 hours after the procedure. ? Do not operate machinery or power tools. 2. Do not push, pull or lift anything that is heavier than 10 lb for 5 days. 3. Ask your health care provider when it is okay to: ? Return to work or  school. ? Resume usual physical activities or sports. ? Resume sexual activity. General instructions  If the catheter site starts to bleed, raise your arm and put firm pressure on the site. If the bleeding does not stop, get help right away. This is a medical emergency.  If you went home on the same day as your procedure, a responsible adult should be with you for the first 24 hours after you arrive home.  Keep all follow-up visits as told by your health care provider. This is important. Contact a health care provider if:  You have a fever.  You have redness, swelling, or yellow drainage around your insertion site. Get help right away if:  You have unusual pain at the radial site.  The catheter insertion area swells very fast.  The insertion area is bleeding, and the bleeding does not stop when you hold steady pressure on the area.  Your arm or hand becomes pale, cool, tingly, or numb. These symptoms may represent a serious problem that is an emergency. Do not wait to see if the symptoms will go away. Get medical help right away. Call your local emergency services (911 in the [place] Do not drive yourself to the hospital. Summary  After the procedure, it is common to have bruising and tenderness at the site.  Follow instructions from your health care provider about how to take care of your radial site wound. Check   the wound every day for signs of infection.  Do not push, pull or lift anything that is heavier than 10 lb for 5 days.  This information is not intended to replace advice given to you by your health care provider. Make sure you discuss any questions you have with your health care provider. Document Revised: 05/27/2017 Document Reviewed: 05/27/2017 Elsevier Patient Education  2020 Elsevier Inc[employer] 

## 2020-01-12 NOTE — H&P (Signed)
Chief Complaint: Patient was seen in consultation today for brain aneurysm evaluation.  Referring Physician(s): Dr. Delice Lesch  Supervising Physician: [name]  Patient Status: [facility] - Out-pt  History of Present Illness: Lindsay Erickson is a 59 y.o. female with a medical history significant for Covid-19 (2020), anxiety, migraines, seizure disorder (since age 38) and tremors. An MRI Head/Face done at [facility] 08/04/19 identified two peri ophthalmic aneurysms; 4 x 7 mm left artery and 3 x 6 mm right artery. At her last neurology visit with Dr. Delice Lesch 12/19/19 she mentioned she had experienced sharp right eye pain that was severe and debilitating and lasted for several minutes.    Lindsay Erickson met with Dr. Estanislado Pandy 12/29/19 for consultation regarding possible treatment options. An image-guided diagnostic cerebral arteriogram was discussed and the patient was in agreement to proceed.   Past Medical History:  Diagnosis Date  . Acid reflux   . Anxiety   . Constipation   . Migraine   . Seizure disorder [facility])     Past Surgical History:  Procedure Laterality Date  . ABDOMINAL HYSTERECTOMY    . BREAST BIOPSY Left    X3  . CATARACT EXTRACTION Bilateral     Allergies: Gabapentin, Primidone, Propranolol, Venlafaxine, Latex, and Codeine  Medications: Prior to Admission medications   Medication Sig Start Date End Date Taking? Authorizing Provider  Aspirin-Acetaminophen-Caffeine (GOODY HEADACHE PO) Take 1 packet by mouth 5 (five) times daily as needed (pain).   Yes [provider]  calcium carbonate (TUMS - DOSED IN MG ELEMENTAL CALCIUM) 500 MG chewable tablet Chew 1 tablet by mouth daily as needed for indigestion or heartburn.   Yes [provider]  Carboxymethylcellul-Glycerin (LUBRICATING EYE DROPS OP) Place 1 drop into both eyes daily as needed (dry eyes).   Yes [provider]  divalproex (DEPAKOTE ER) 500 MG 24 hr tablet Take 1 tablet (500  mg total) by mouth daily. Patient taking differently: Take 500 mg by mouth at bedtime.  12/19/19  Yes Cameron Sprang, MD  escitalopram (LEXAPRO) 5 MG tablet Take 1 tablet (5 mg total) by mouth daily. 12/19/19  Yes Cameron Sprang, MD  rosuvastatin (CRESTOR) 10 MG tablet Take 1 tablet (10 mg total) by mouth daily. 04/06/19 01/05/20 Yes Revankar, Reita Cliche, MD     Family History  Problem Relation Age of Onset  . Heart disease Mother   . Stroke Mother   . Heart disease Father   . Atrial fibrillation Sister   . Diabetes Brother   . Diabetes Maternal Grandmother   . Heart attack Maternal Uncle   . Heart attack Maternal Uncle     Social History   Socioeconomic History  . Marital status: Married    Spouse name: Not on file  . Number of children: Not on file  . Years of education: Not on file  . Highest education level: Not on file  Occupational History  . Not on file  Tobacco Use  . Smoking status: Current Every Day Smoker    Types: Cigarettes  . Smokeless tobacco: Never Used  Vaping Use  . Vaping Use: Never used  Substance and Sexual Activity  . Alcohol use: Never  . Drug use: Never  . Sexual activity: Not on file  Other Topics Concern  . Not on file  Social History Narrative   One story home mobile home   Right handed   Drinks caffeine on occasion   Social Determinants of Radio broadcast assistant  Strain:   . Difficulty of Paying Living Expenses: Not on file  Food Insecurity:   . Worried About Charity fundraiser in the Last Year: Not on file  . Ran Out of Food in the Last Year: Not on file  Transportation Needs:   . Lack of Transportation (Medical): Not on file  . Lack of Transportation (Non-Medical): Not on file  Physical Activity:   . Days of Exercise per Week: Not on file  . Minutes of Exercise per Session: Not on file  Stress:   . Feeling of Stress : Not on file  Social Connections:   . Frequency of Communication with Friends and Family: Not on file  .  Frequency of Social Gatherings with Friends and Family: Not on file  . Attends Religious Services: Not on file  . Active Member of Clubs or Organizations: Not on file  . Attends Archivist Meetings: Not on file  . Marital Status: Not on file    Review of Systems: A 12 point ROS discussed and pertinent positives are indicated in the HPI above.  All other systems are negative.  Review of Systems  Constitutional: Negative for appetite change and fatigue.  Respiratory: Negative for cough and shortness of breath.   Cardiovascular: Negative for chest pain and leg swelling.  Gastrointestinal: Negative for abdominal pain, diarrhea, nausea and vomiting.  Musculoskeletal: Negative for back pain.  Neurological: Positive for tremors and speech difficulty. Negative for headaches.    Vital Signs: BP (!) 149/73   Pulse 85   Temp 98.1 F (36.7 C) (Oral)   Resp 16   Ht _0  (1.6 m)   Wt 130 lb (59 kg)   SpO2 100%   BMI 23.03 kg/m   Physical Exam Constitutional:      General: She is not in acute distress. HENT:     Mouth/Throat:     Mouth: Mucous membranes are moist.     Pharynx: Oropharynx is clear.  Cardiovascular:     Rate and Rhythm: Normal rate and regular rhythm.     Pulses: Normal pulses.     Heart sounds: Normal heart sounds.  Pulmonary:     Effort: Pulmonary effort is normal.     Breath sounds: Normal breath sounds.  Abdominal:     General: Bowel sounds are normal.     Palpations: Abdomen is soft.  Skin:    General: Skin is warm and dry.  Neurological:     General: No focal deficit present.     Mental Status: She is alert and oriented to person, place, and time. Mental status is at baseline.     Motor: Weakness and tremor present.     Coordination: Coordination abnormal.     Imaging: No results found.  Labs:  CBC: Recent Labs    01/12/20 0705  WBC 6.5  HGB 13.5  HCT 42.6  PLT 229    COAGS: Recent Labs    01/12/20 0705  INR 0.9  APTT 29     BMP: No results for input(s): NA, K, CL, CO2, GLUCOSE, BUN, CALCIUM, CREATININE, GFRNONAA, GFRAA in the last 8760 hours.  Invalid input(s): CMP  LIVER FUNCTION TESTS: Recent Labs    02/25/19 0950  BILITOT <0.2  AST 16  ALT 8  ALKPHOS 91  PROT 7.0  ALBUMIN 4.4    TUMOR MARKERS: No results for input(s): AFPTM, CEA, CA199, CHROMGRNA in the last 8760 hours.  Assessment and Plan:  Left and Right peri ophthalmic  artery aneurysms: Lindsay Erickson, 59 year old female, presents today to the [facility] Interventional Radiology department for an image-guided diagnostic cerebral arteriogram for further work-up, diagnosis and potential treatment options. Risks and benefits of this procedure were discussed with the patient including, but not limited to bleeding, infection, vascular injury or contrast induced renal failure.  This interventional procedure involves the use of X-rays and because of the nature of the planned procedure, it is possible that we will have prolonged use of X-ray fluoroscopy.  Potential radiation risks to you include (but are not limited to) the following: - A slightly elevated risk for cancer  several years later in life. This risk is typically less than 0.5% percent. This risk is low in comparison to the normal incidence of human cancer, which is 33% for women and 50% for men according to the [place]. - Radiation induced injury can include skin redness, resembling a rash, tissue breakdown / ulcers and hair loss (which can be temporary or permanent).   The likelihood of either of these occurring depends on the difficulty of the procedure and whether you are sensitive to radiation due to previous procedures, disease, or genetic conditions.   IF your procedure requires a prolonged use of radiation, you will be notified and given written instructions for further action.  It is your responsibility to monitor the irradiated area for the 2 weeks  following the procedure and to notify your physician if you are concerned that you have suffered a radiation induced injury.    All of the patient's questions were answered, patient is agreeable to proceed.  The patient has been  NPO. Labs and vitals have been reviewed.   Consent signed and in chart.  Thank you for this interesting consult.  I greatly enjoyed meeting Lindsay Erickson and look forward to participating in their care.  A copy of this report was sent to the requesting provider on this date.  Electronically Signed: Soyla Dryer, AGACNP-BC [phone] 01/12/2020, 8:09 AM   I spent a total of  30 Minutes   in face to face in clinical consultation, greater than 50% of which was counseling/coordinating care for diagnostic cerebral arteriogram.

## 2020-03-13 ENCOUNTER — Telehealth: Payer: Self-pay | Admitting: Student

## 2020-03-13 ENCOUNTER — Other Ambulatory Visit (HOSPITAL_COMMUNITY): Payer: Self-pay | Admitting: Interventional Radiology

## 2020-03-13 DIAGNOSIS — I671 Cerebral aneurysm, nonruptured: Secondary | ICD-10-CM

## 2020-03-13 NOTE — Telephone Encounter (Signed)
[facility] [phone]) at [phone] and left VM to fill prescription- Plavix 75 mg tablets, take one tablet by mouth once daily, dispense 30 tablets with 3 refills.   Bea Graff Aundre Hietala, PA-C 03/13/2020, 3:59 PM

## 2020-04-09 ENCOUNTER — Other Ambulatory Visit (HOSPITAL_COMMUNITY): Payer: Self-pay | Admitting: Radiology

## 2020-04-09 ENCOUNTER — Other Ambulatory Visit (HOSPITAL_COMMUNITY)
Admission: RE | Admit: 2020-04-09 | Discharge: 2020-04-09 | Disposition: A | Discharge status: Home / Self Care | Payer: Medicare HMO | Source: Ambulatory Visit | Attending: Interventional Radiology | Admitting: Interventional Radiology

## 2020-04-09 DIAGNOSIS — I671 Cerebral aneurysm, nonruptured: Secondary | ICD-10-CM

## 2020-04-09 DIAGNOSIS — Z01812 Encounter for preprocedural laboratory examination: Secondary | ICD-10-CM | POA: Diagnosis not present

## 2020-04-09 DIAGNOSIS — Z20822 Contact with and (suspected) exposure to covid-19: Secondary | ICD-10-CM | POA: Insufficient documentation

## 2020-04-09 LAB — SARS CORONAVIRUS 2 (TAT 6-24 HRS): SARS Coronavirus 2: NEGATIVE

## 2020-04-09 LAB — PLATELET INHIBITION P2Y12: Platelet Function  P2Y12: 26 [PRU] — ABNORMAL LOW (ref 182–335)

## 2020-04-10 ENCOUNTER — Encounter (HOSPITAL_COMMUNITY): Payer: Self-pay | Admitting: Vascular Surgery

## 2020-04-10 ENCOUNTER — Encounter (HOSPITAL_COMMUNITY): Payer: Self-pay | Admitting: Interventional Radiology

## 2020-04-10 ENCOUNTER — Other Ambulatory Visit: Payer: Self-pay | Admitting: Student

## 2020-04-10 NOTE — Progress Notes (Signed)
Anesthesia Chart Review: Lindsay Erickson    Case: [id number] Date/Time: 04/11/20 0815   Procedure: EMBOLIZATION (N/A )   Anesthesia type: General   Pre-op diagnosis: ANUERYSM   Location: [facility] OR RADIOLOGY ROOM / [place] [facility]   Surgeons: [name], MD      DISCUSSION: Patient is a 59 year old female scheduled for the above procedure.  She has bilateral periophthalmic artery aneurysms.  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 She lives in [place].  Other history includes smoking, post-operative N/V, dyslipidemia, seizure disorder, headaches (cluster/migraines), essential tremor, acid reflux, anemia, depression, anxiety. Non-ischemic stress test 07/2018.  She started ASA and Plavix on 04/05/20. P2y12 26 on 04/09/20--additional orders per IR. 04/09/20 preprocedure COVID-19 test negative. Anesthesia team to evaluate on the day of procedure.   VS:  BP Readings from Last 3 Encounters:  01/12/20 107/72  12/19/19 98/65  09/07/19 114/73   Pulse Readings from Last 3 Encounters:  01/12/20 73  12/19/19 77  09/07/19 90    PROVIDERS: Serita Grammes, MD is listed as PCP - [name], MD is neurologist. Last visit 12/19/19. Due to significant tremors, she referred patient to movement disorder specialists Dr. [name]. Patient was also referred to to IR for cerebral aneurysms.    [name], MD is cardiologist. Seen in 06/2018 for chest discomfort and had a low risk stress test. Last visit 01/05/19 with no chest pain at that time. By notes, "    EKG done today reveals sinus tachycardia otherwise unremarkable." Six month follow-up for dyslipidemia recommended--she was changed from simvastatin to rosuvastatin.     LABS: She is for labs per IR on the day of procedure. P2y12 26 on 04/09/20.    OTHER:  EEG 09/14/19: Impression: This 1-hour awake and asleep EEG is normal, although slightly limited due to  muscle/movement artifact over the bilateral temporal regions obscuring EEG during wakefulness. Clinical Correlation: A normal EEG does not exclude a clinical diagnosis of epilepsy.  If further clinical questions remain, prolonged EEG may be helpful.  Clinical correlation is advised.   IMAGES/IR: Bilateral common carotid and innominate angiography 01/12/20: IMPRESSION: - Approximately 8 mm x 5.1 mm saccular aneurysm with a neck of 4.2 mm of the left internal carotid artery just distal to the origin of the ophthalmic artery. - Approximately 7.2 mm x 5 mm saccular aneurysm arising from the right internal carotid artery in the region of the ophthalmic artery projecting laterally and slightly inferiorly.   EKG: 01/05/19 EKG reviewed.  It was done at cardiologist Dr. [name] office. He wrote, "EKG done today reveals sinus tachycardia otherwise unremarkable."   CV: Nuclear stress test 07/20/18:  The left ventricular ejection fraction is hyperdynamic (>65%).  Nuclear stress EF: 78%.  Blood pressure demonstrated a normal response to exercise.  There was no ST segment deviation noted during stress.  The study is normal.  This is a low risk study.  No evidence of ischemia or MI.  Normal LVEF.     Past Medical History:  Diagnosis Date  . Acid reflux   . Anemia   . Anxiety   . Constipation   . Depression   . Essential tremor   . History of kidney stones   . Migraine    cluster headache occasional  . Peripheral vascular disease ([place])    blood clot in leg after being kicked by a horse - age 28  . Pneumonia   .  PONV (postoperative nausea and vomiting)    after breast biopsy  . Seizure disorder [facility])     Past Surgical History:  Procedure Laterality Date  . ABDOMINAL HYSTERECTOMY    . BREAST BIOPSY Left    X3  . CATARACT EXTRACTION Bilateral   . IR ANGIO INTRA EXTRACRAN SEL COM CAROTID INNOMINATE BILAT MOD SED  01/12/2020  . IR ANGIO VERTEBRAL SEL VERTEBRAL BILAT MOD SED   01/12/2020  . IR US GUIDE VASC ACCESS RIGHT  01/12/2020  . SALPINGOOPHORECTOMY     both ovaries removed    MEDICATIONS: No current facility-administered medications for this encounter.   . Aspirin-Acetaminophen-Caffeine (GOODY HEADACHE PO)  . calcium carbonate (TUMS - DOSED IN MG ELEMENTAL CALCIUM) 500 MG chewable tablet  . divalproex (DEPAKOTE ER) 500 MG 24 hr tablet  . rosuvastatin (CRESTOR) 10 MG tablet  . aspirin EC 81 MG tablet  . clopidogrel (PLAVIX) 75 MG tablet  . escitalopram (LEXAPRO) 5 MG tablet   By medication list she is not taking Lexapro.  PAT RN did confirm that patient had started ASA and Plavix.    Myra Gianotti, PA-C Surgical Short Stay/Anesthesiology [facility] Phone [phone] [facility] Phone [phone] 04/10/2020 2:39 PM

## 2020-04-10 NOTE — Progress Notes (Addendum)
Lindsay Erickson, from [place] ICU called me back and stated that they will be able to do all the things for the patient so her husband will not be allowed to stay overnight. She states he is welcome to come in at 7 AM and stay until 8 PM.   1:44 PM - I called patient and let her know that her husband would not be able to stay overnight in the ICU but can stay until 8 PM and then come back at 7 AM the next morning. I told her that he would be allowed to come with her to the pre-op area in the AM. She voiced her appreciation.

## 2020-04-10 NOTE — Progress Notes (Addendum)
Spoke with pt for pre-op call. Pt denies cardiac history, HTN and diabetes. Pt has hx of seizures, states it's been a long time since she's had a seizure. Pt states she does very bad essential tremors. She is requesting that her husband be able to stay with her after surgery overnight. I have reached out to [place] ICU and spoke with Jerene Pitch, Agricultural consultant. She states she will need to speak with Surveyor, quantity and she will let me know. She states that they don't normally let a family member stay but she will check.   Pt started her Aspirin and Plavix on 04/05/20. She voices understanding that she takes both in the AM prior to arrival.   Covid test done 04/09/20 and it's negative.  Pt states she's been in quarantine since the test was done and understands that she stays in quarantine until she comes to the hospital tomorrow.

## 2020-04-10 NOTE — Anesthesia Preprocedure Evaluation (Deleted)
Anesthesia Evaluation    Airway        Dental   Pulmonary Current Smoker and Patient abstained from smoking.,           Cardiovascular + Peripheral Vascular Disease       Neuro/Psych    GI/Hepatic GERD  ,  Endo/Other    Renal/GU      Musculoskeletal   Abdominal   Peds  Hematology   Anesthesia Other Findings   Reproductive/Obstetrics                             Anesthesia Physical Anesthesia Plan  ASA: III  Anesthesia Plan: General   Post-op Pain Management:    Induction:   PONV Risk Score and Plan:   Airway Management Planned:   Additional Equipment:   Intra-op Plan:   Post-operative Plan:   Informed Consent:   Plan Discussed with:   Anesthesia Plan Comments: (PAT note written 04/10/2020 by Myra Gianotti, PA-C. )       Anesthesia Quick Evaluation

## 2020-04-11 ENCOUNTER — Encounter (HOSPITAL_COMMUNITY)
Admission: RE | Disposition: A | Discharge status: Home / Self Care | Payer: Self-pay | Source: Home / Self Care | Attending: Interventional Radiology

## 2020-04-11 ENCOUNTER — Encounter (HOSPITAL_COMMUNITY): Payer: Self-pay

## 2020-04-11 ENCOUNTER — Ambulatory Visit (HOSPITAL_COMMUNITY)
Admission: RE | Admit: 2020-04-11 | Discharge: 2020-04-11 | Disposition: A | Discharge status: Home / Self Care | Payer: Medicare HMO | Attending: Interventional Radiology | Admitting: Interventional Radiology

## 2020-04-11 ENCOUNTER — Encounter (HOSPITAL_COMMUNITY): Payer: Self-pay | Admitting: Interventional Radiology

## 2020-04-11 ENCOUNTER — Other Ambulatory Visit: Payer: Self-pay

## 2020-04-11 ENCOUNTER — Ambulatory Visit (HOSPITAL_COMMUNITY)
Admission: RE | Admit: 2020-04-11 | Discharge: 2020-04-11 | Disposition: A | Discharge status: Home / Self Care | Payer: Medicare HMO | Source: Ambulatory Visit | Attending: Interventional Radiology | Admitting: Interventional Radiology

## 2020-04-11 DIAGNOSIS — I728 Aneurysm of other specified arteries: Secondary | ICD-10-CM | POA: Diagnosis not present

## 2020-04-11 DIAGNOSIS — Z7902 Long term (current) use of antithrombotics/antiplatelets: Secondary | ICD-10-CM | POA: Diagnosis not present

## 2020-04-11 DIAGNOSIS — G250 Essential tremor: Secondary | ICD-10-CM | POA: Diagnosis not present

## 2020-04-11 DIAGNOSIS — Z7982 Long term (current) use of aspirin: Secondary | ICD-10-CM | POA: Insufficient documentation

## 2020-04-11 DIAGNOSIS — R000 Tachycardia, unspecified: Secondary | ICD-10-CM | POA: Insufficient documentation

## 2020-04-11 DIAGNOSIS — K219 Gastro-esophageal reflux disease without esophagitis: Secondary | ICD-10-CM | POA: Insufficient documentation

## 2020-04-11 DIAGNOSIS — Z5309 Procedure and treatment not carried out because of other contraindication: Secondary | ICD-10-CM | POA: Insufficient documentation

## 2020-04-11 DIAGNOSIS — G43909 Migraine, unspecified, not intractable, without status migrainosus: Secondary | ICD-10-CM | POA: Insufficient documentation

## 2020-04-11 DIAGNOSIS — I671 Cerebral aneurysm, nonruptured: Secondary | ICD-10-CM

## 2020-04-11 DIAGNOSIS — G40909 Epilepsy, unspecified, not intractable, without status epilepticus: Secondary | ICD-10-CM | POA: Insufficient documentation

## 2020-04-11 DIAGNOSIS — I739 Peripheral vascular disease, unspecified: Secondary | ICD-10-CM | POA: Insufficient documentation

## 2020-04-11 DIAGNOSIS — Z79899 Other long term (current) drug therapy: Secondary | ICD-10-CM | POA: Insufficient documentation

## 2020-04-11 HISTORY — DX: Pneumonia, unspecified organism: J18.9

## 2020-04-11 HISTORY — DX: Personal history of urinary calculi: Z87.442

## 2020-04-11 HISTORY — DX: Peripheral vascular disease, unspecified: I73.9

## 2020-04-11 HISTORY — DX: Other specified postprocedural states: Z98.890

## 2020-04-11 HISTORY — DX: Depression, unspecified: F32.A

## 2020-04-11 HISTORY — DX: Hyperlipidemia, unspecified: E78.5

## 2020-04-11 HISTORY — DX: Other specified postprocedural states: R11.2

## 2020-04-11 HISTORY — DX: Anemia, unspecified: D64.9

## 2020-04-11 HISTORY — DX: Essential tremor: G25.0

## 2020-04-11 LAB — BASIC METABOLIC PANEL
Anion gap: 13 (ref 5–15)
BUN: 26 mg/dL — ABNORMAL HIGH (ref 8–23)
CO2: 24 mmol/L (ref 22–32)
Calcium: 9.4 mg/dL (ref 8.9–10.3)
Chloride: 102 mmol/L (ref 98–111)
Creatinine, Ser: 0.69 mg/dL (ref 0.44–1.00)
GFR, Estimated: >60 mL/min (ref >60)
Glucose, Bld: 109 mg/dL — ABNORMAL HIGH (ref 70–99)
Potassium: 3.4 mmol/L — ABNORMAL LOW (ref 3.5–5.1)
Sodium: 139 mmol/L (ref 135–145)

## 2020-04-11 LAB — CBC WITH DIFFERENTIAL/PLATELET
Abs Immature Granulocytes: 0.02 10*3/uL (ref 0.00–0.07)
Basophils Absolute: 0 10*3/uL (ref 0.0–0.1)
Basophils Relative: 1 %
Eosinophils Absolute: 0 10*3/uL (ref 0.0–0.5)
Eosinophils Relative: 1 %
HCT: 39.8 % (ref 36.0–46.0)
Hemoglobin: 12.7 g/dL (ref 12.0–15.0)
Immature Granulocytes: 0 %
Lymphocytes Relative: 26 %
Lymphs Abs: 1.6 10*3/uL (ref 0.7–4.0)
MCH: 32.4 pg (ref 26.0–34.0)
MCHC: 31.9 g/dL (ref 30.0–36.0)
MCV: 101.5 fL — ABNORMAL HIGH (ref 80.0–100.0)
Monocytes Absolute: 0.8 10*3/uL (ref 0.1–1.0)
Monocytes Relative: 13 %
Neutro Abs: 3.7 10*3/uL (ref 1.7–7.7)
Neutrophils Relative %: 59 %
Platelets: 248 10*3/uL (ref 150–400)
RBC: 3.92 MIL/uL (ref 3.87–5.11)
RDW: 13.3 % (ref 11.5–15.5)
WBC: 6.2 10*3/uL (ref 4.0–10.5)
nRBC: 0 % (ref 0.0–0.2)

## 2020-04-11 LAB — PROTIME-INR
INR: 1.1 (ref 0.8–1.2)
Prothrombin Time: 13.6 seconds (ref 11.4–15.2)

## 2020-04-11 LAB — PLATELET INHIBITION P2Y12: Platelet Function  P2Y12: 8 [PRU] — ABNORMAL LOW (ref 182–335)

## 2020-04-11 LAB — APTT: aPTT: 36 seconds (ref 24–36)

## 2020-04-11 SURGERY — IR WITH ANESTHESIA
Anesthesia: General

## 2020-04-11 MED ORDER — ORAL CARE MOUTH RINSE: 15.0000 mL | Freq: Once | OROMUCOSAL | Status: AC

## 2020-04-11 MED ORDER — ASPIRIN EC 325 MG PO TBEC
325.0000 mg | DELAYED_RELEASE_TABLET | ORAL | Status: DC
  Filled 2020-04-11: qty 1

## 2020-04-11 MED ORDER — CLOPIDOGREL BISULFATE 75 MG PO TABS
75.0000 mg | ORAL_TABLET | ORAL | Status: DC
  Filled 2020-04-11: qty 1

## 2020-04-11 MED ORDER — CHLORHEXIDINE GLUCONATE 0.12 % MT SOLN
15.0000 mL | Freq: Once | OROMUCOSAL | Status: AC
  Administered 2020-04-11: 15 mL via OROMUCOSAL
  Filled 2020-04-11: qty 15

## 2020-04-11 MED ORDER — PROPOFOL 10 MG/ML IV BOLUS
INTRAVENOUS | Status: AC
  Filled 2020-04-11: qty 20

## 2020-04-11 MED ORDER — SODIUM CHLORIDE 0.9 % IV SOLN: INTRAVENOUS | Status: DC

## 2020-04-11 MED ORDER — FENTANYL CITRATE (PF) 250 MCG/5ML IJ SOLN
INTRAMUSCULAR | Status: AC
  Filled 2020-04-11: qty 5

## 2020-04-11 MED ORDER — NIMODIPINE 30 MG PO CAPS
0.0000 mg | ORAL_CAPSULE | ORAL | Status: DC
  Filled 2020-04-11: qty 2

## 2020-04-11 MED ORDER — CEFAZOLIN SODIUM-DEXTROSE 2-4 GM/100ML-% IV SOLN
2.0000 g | INTRAVENOUS | Status: DC
  Filled 2020-04-11: qty 100

## 2020-04-11 NOTE — H&P (Signed)
Chief Complaint: Patient was seen in consultation today for cerebral arteriogram with possible Left internal carotid artery aneurysm embolization at the request of Dr Delice Lesch   Supervising Physician: [name]  Patient Status: [name] - Out-pt  History of Present Illness: Lindsay Erickson is a 59 y.o. 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  Referred to Dr Estanislado Pandy for evaluation and management of same She has family Hx: Mother passed with ruptured cerebral aneurysm   Was seen in consultation 12/29/19  Cerebral arteriogram performed 01/12/20 IMPRESSION: Approximately 8 mm x 5.1 mm saccular aneurysm with a neck of 4.2 mm of the left internal carotid artery just distal to the origin of the ophthalmic artery. Approximately 7.2 mm x 5 mm saccular aneurysm arising from the right internal carotid artery in the region of the ophthalmic artery projecting laterally and slightly inferiorly.  Scheduled now for Left internal carotid artery aneurysm embolization Possible R ICA aneurysm embolization possible at later date  Started Plavix 03/13/20 P2y12 26 on 04/09/20  Past Medical History:  Diagnosis Date  . Acid reflux   . Anemia   . Anxiety   . Constipation   . Depression   . Dyslipidemia   . Essential tremor   . History of kidney stones   . Migraine    cluster headache occasional  . Peripheral vascular disease ([place])    blood clot in leg after being kicked by a horse - age 23  . Pneumonia   . PONV  (postoperative nausea and vomiting)    after breast biopsy  . Seizure disorder [facility])     Past Surgical History:  Procedure Laterality Date  . ABDOMINAL HYSTERECTOMY    . BREAST BIOPSY Left    X3  . CATARACT EXTRACTION Bilateral   . IR ANGIO INTRA EXTRACRAN SEL COM CAROTID INNOMINATE BILAT MOD SED  01/12/2020  . IR ANGIO VERTEBRAL SEL VERTEBRAL BILAT MOD SED  01/12/2020  . IR US GUIDE VASC ACCESS RIGHT  01/12/2020  . SALPINGOOPHORECTOMY     both ovaries removed    Allergies: Gabapentin, Primidone, Propranolol, Venlafaxine, Latex, and Codeine  Medications: Prior to Admission medications   Medication Sig Start Date End Date Taking? Authorizing Provider  aspirin EC 81 MG tablet Take 81 mg by mouth daily. Swallow whole.    [provider]  Aspirin-Acetaminophen-Caffeine (GOODY HEADACHE PO) Take 1 packet by mouth 2 (two) times daily as needed (pain).     [provider]  calcium carbonate (TUMS - DOSED IN MG ELEMENTAL CALCIUM) 500 MG chewable tablet Chew 1 tablet by mouth 3 (three) times daily as needed for indigestion or heartburn.     [provider]  clopidogrel (PLAVIX) 75 MG tablet Take 75 mg by mouth daily. 04/02/20   [provider]  divalproex (DEPAKOTE ER) 500 MG 24 hr tablet Take 1 tablet (500 mg total) by mouth daily. Patient taking differently: Take 500 mg by mouth at bedtime.  12/19/19   Cameron Sprang,  MD  escitalopram (LEXAPRO) 5 MG tablet Take 1 tablet (5 mg total) by mouth daily. Patient not taking: Reported on 04/03/2020 12/19/19   Cameron Sprang, MD  rosuvastatin (CRESTOR) 10 MG tablet Take 1 tablet (10 mg total) by mouth daily. Patient taking differently: Take 10 mg by mouth at bedtime.  04/06/19 04/03/21  Revankar, Reita Cliche, MD     Family History  Problem Relation Age of Onset  . Heart disease Mother   . Stroke Mother   . Heart disease Father   . Atrial fibrillation Sister   . Diabetes Brother   . Diabetes Maternal Grandmother   .  Heart attack Maternal Uncle   . Heart attack Maternal Uncle     Social History   Socioeconomic History  . Marital status: Married    Spouse name: Not on file  . Number of children: Not on file  . Years of education: Not on file  . Highest education level: Not on file  Occupational History  . Not on file  Tobacco Use  . Smoking status: Current Every Day Smoker    Types: Cigarettes  . Smokeless tobacco: Never Used  Vaping Use  . Vaping Use: Never used  Substance and Sexual Activity  . Alcohol use: Never  . Drug use: Never  . Sexual activity: Not on file  Other Topics Concern  . Not on file  Social History Narrative   One story home mobile home   Right handed   Drinks caffeine on occasion   Social Determinants of Health   Financial Resource Strain:   . Difficulty of Paying Living Expenses: Not on file  Food Insecurity:   . Worried About Charity fundraiser in the Last Year: Not on file  . Ran Out of Food in the Last Year: Not on file  Transportation Needs:   . Lack of Transportation (Medical): Not on file  . Lack of Transportation (Non-Medical): Not on file  Physical Activity:   . Days of Exercise per Week: Not on file  . Minutes of Exercise per Session: Not on file  Stress:   . Feeling of Stress : Not on file  Social Connections:   . Frequency of Communication with Friends and Family: Not on file  . Frequency of Social Gatherings with Friends and Family: Not on file  . Attends Religious Services: Not on file  . Active Member of Clubs or Organizations: Not on file  . Attends Archivist Meetings: Not on file  . Marital Status: Not on file    Review of Systems: A 12 point ROS discussed and pertinent positives are indicated in the HPI above.  All other systems are negative.  Review of Systems  Constitutional: Negative for activity change, appetite change, diaphoresis, fatigue, fever and unexpected weight change.  HENT: Negative for hearing loss,  nosebleeds, tinnitus and trouble swallowing.   Eyes: Positive for pain. Negative for photophobia, redness and visual disturbance.  Respiratory: Negative for cough and shortness of breath.   Cardiovascular: Negative for chest pain.  Gastrointestinal: Negative for abdominal pain, nausea and vomiting.  Musculoskeletal: Negative for back pain and gait problem.  Neurological: Positive for tremors and headaches. Negative for dizziness, seizures, syncope, facial asymmetry, speech difficulty, weakness, light-headedness and numbness.  Psychiatric/Behavioral: Negative for behavioral problems and confusion.    Vital Signs: There were no vitals taken for this visit.  Physical Exam Vitals reviewed.  Constitutional:      Comments: Essential tremors  HENT:  Mouth/Throat:     Mouth: Mucous membranes are moist.  Eyes:     Extraocular Movements: Extraocular movements intact.  Cardiovascular:     Rate and Rhythm: Normal rate and regular rhythm.     Heart sounds: Normal heart sounds.  Pulmonary:     Effort: Pulmonary effort is normal.     Breath sounds: Normal breath sounds.  Abdominal:     Palpations: Abdomen is soft.     Tenderness: There is no abdominal tenderness.  Musculoskeletal:        General: Normal range of motion.     Cervical back: Normal range of motion.     Right lower leg: No edema.     Left lower leg: No edema.  Skin:    General: Skin is warm.  Neurological:     Mental Status: She is oriented to person, place, and time.  Psychiatric:        Behavior: Behavior normal.     Imaging: No results found.  Labs:  CBC: Recent Labs    01/12/20 0705  WBC 6.5  HGB 13.5  HCT 42.6  PLT 229    COAGS: Recent Labs    01/12/20 0705  INR 0.9  APTT 29    BMP: Recent Labs    01/12/20 0705  NA 141  K 3.9  CL 106  CO2 23  GLUCOSE 97  BUN 20  CALCIUM 9.6  CREATININE 0.69  GFRNONAA >60  GFRAA >60    LIVER FUNCTION TESTS: No results for input(s): BILITOT,  AST, ALT, ALKPHOS, PROT, ALBUMIN in the last 8760 hours.  TUMOR MARKERS: No results for input(s): AFPTM, CEA, CA199, CHROMGRNA in the last 8760 hours.  Assessment and Plan:  Headaches with Rt eye pain since early 2021 Imaging revealing L and R ICA aneurysms Scheduled now for cerebral arteriogram with L ICA aneurysm embolization Risks and benefits of cerebral angiogram with intervention were discussed with the patient including, but not limited to bleeding, infection, vascular injury, contrast induced renal failure, stroke or even death.  This interventional procedure involves the use of X-rays and because of the nature of the planned procedure, it is possible that we will have prolonged use of X-ray fluoroscopy.  Potential radiation risks to you include (but are not limited to) the following: - A slightly elevated risk for cancer  several years later in life. This risk is typically less than 0.5% percent. This risk is low in comparison to the normal incidence of human cancer, which is 33% for women and 50% for men according to the [place]. - Radiation induced injury can include skin redness, resembling a rash, tissue breakdown / ulcers and hair loss (which can be temporary or permanent).   The likelihood of either of these occurring depends on the difficulty of the procedure and whether you are sensitive to radiation due to previous procedures, disease, or genetic conditions.   IF your procedure requires a prolonged use of radiation, you will be notified and given written instructions for further action.  It is your responsibility to monitor the irradiated area for the 2 weeks following the procedure and to notify your physician if you are concerned that you have suffered a radiation induced injury.    All of the patient's questions were answered, patient is agreeable to proceed. Consent signed and in chart. She is aware if procedure is performed-- she will be admitted to  Neuro ICU overnight and planned for discharge if stable in am She and  husband agreeable to plan  Thank you for this interesting consult.  I greatly enjoyed meeting Lindsay Erickson and look forward to participating in their care.  A copy of this report was sent to the requesting provider on this date.  Electronically Signed: Lavonia Drafts, PA-C 04/11/2020, 7:32 AM   I spent a total of    25 Minutes in face to face in clinical consultation, greater than 50% of which was counseling/coordinating care for L ICA aneurysm embolization

## 2020-04-11 NOTE — Progress Notes (Signed)
   Was scheduled for L ICA aneurysm embolization in IR with Dr Estanislado Pandy  P2y12 8 this am  Dr Estanislado Pandy discussed with pt and husband Will cancel today Change Plavix to half tablet daily Continue ASA 81 mg daily  Recheck P2y12 Mon 12/13 And decide plan from there  Pt is aware and agreeable

## 2020-04-11 NOTE — Progress Notes (Signed)
Surgery cancelled due to abnormal labs. Dr. Estanislado Pandy spoke with patient to discuss next steps. Patient verbalized instructions. Patient ambulated outside with nurse and husband/discharge to home with husband.

## 2020-04-13 ENCOUNTER — Encounter (HOSPITAL_COMMUNITY): Payer: Self-pay

## 2020-04-13 DIAGNOSIS — B0233 Zoster keratitis: Secondary | ICD-10-CM | POA: Diagnosis not present

## 2020-04-13 DIAGNOSIS — Z6821 Body mass index (BMI) 21.0-21.9, adult: Secondary | ICD-10-CM | POA: Diagnosis not present

## 2020-04-16 ENCOUNTER — Other Ambulatory Visit (HOSPITAL_COMMUNITY): Payer: Medicare HMO

## 2020-04-16 DIAGNOSIS — R195 Other fecal abnormalities: Secondary | ICD-10-CM | POA: Diagnosis not present

## 2020-04-16 DIAGNOSIS — H1031 Unspecified acute conjunctivitis, right eye: Secondary | ICD-10-CM | POA: Diagnosis not present

## 2020-04-16 DIAGNOSIS — B0233 Zoster keratitis: Secondary | ICD-10-CM | POA: Diagnosis not present

## 2020-04-16 DIAGNOSIS — Z6822 Body mass index (BMI) 22.0-22.9, adult: Secondary | ICD-10-CM | POA: Diagnosis not present

## 2020-04-19 ENCOUNTER — Ambulatory Visit: Admit: 2020-04-19 | Payer: Medicare HMO | Admitting: Interventional Radiology

## 2020-04-19 ENCOUNTER — Ambulatory Visit (HOSPITAL_COMMUNITY): Payer: Medicare HMO

## 2020-04-19 SURGERY — IR WITH ANESTHESIA
Anesthesia: General

## 2020-05-01 ENCOUNTER — Telehealth (HOSPITAL_COMMUNITY): Payer: Self-pay

## 2020-05-01 DIAGNOSIS — B0233 Zoster keratitis: Secondary | ICD-10-CM | POA: Diagnosis not present

## 2020-05-01 DIAGNOSIS — J302 Other seasonal allergic rhinitis: Secondary | ICD-10-CM | POA: Diagnosis not present

## 2020-05-01 NOTE — Telephone Encounter (Signed)
Pt's husband called regarding upcoming procedure. Sent JM a message regarding scheduling. AW

## 2020-05-09 ENCOUNTER — Telehealth (HOSPITAL_COMMUNITY): Payer: Self-pay | Admitting: Radiology

## 2020-05-09 NOTE — Telephone Encounter (Signed)
Called pt and husband. Patient is ready to proceed with treatment with Deveshwar. I will set up procedure and call them tomorrow with the appt info. Pt and spouse agree with this plan of care. JM

## 2020-05-10 ENCOUNTER — Encounter (HOSPITAL_COMMUNITY): Payer: Self-pay

## 2020-05-10 DIAGNOSIS — Z23 Encounter for immunization: Secondary | ICD-10-CM | POA: Diagnosis not present

## 2020-05-11 ENCOUNTER — Telehealth (HOSPITAL_COMMUNITY): Payer: Self-pay | Admitting: Radiology

## 2020-05-11 ENCOUNTER — Other Ambulatory Visit: Payer: Self-pay

## 2020-05-11 ENCOUNTER — Other Ambulatory Visit (HOSPITAL_COMMUNITY): Payer: Self-pay | Admitting: Radiology

## 2020-05-11 ENCOUNTER — Other Ambulatory Visit (HOSPITAL_COMMUNITY)
Admission: RE | Admit: 2020-05-11 | Discharge: 2020-05-11 | Disposition: A | Discharge status: Home / Self Care | Payer: Medicare HMO | Source: Ambulatory Visit | Attending: Interventional Radiology | Admitting: Interventional Radiology

## 2020-05-11 ENCOUNTER — Encounter (HOSPITAL_COMMUNITY): Payer: Self-pay | Admitting: Interventional Radiology

## 2020-05-11 ENCOUNTER — Other Ambulatory Visit: Payer: Self-pay | Admitting: Student

## 2020-05-11 DIAGNOSIS — M79604 Pain in right leg: Secondary | ICD-10-CM | POA: Diagnosis not present

## 2020-05-11 DIAGNOSIS — E782 Mixed hyperlipidemia: Secondary | ICD-10-CM | POA: Diagnosis not present

## 2020-05-11 DIAGNOSIS — G40909 Epilepsy, unspecified, not intractable, without status epilepticus: Secondary | ICD-10-CM | POA: Diagnosis present

## 2020-05-11 DIAGNOSIS — I671 Cerebral aneurysm, nonruptured: Secondary | ICD-10-CM

## 2020-05-11 DIAGNOSIS — R531 Weakness: Secondary | ICD-10-CM | POA: Diagnosis not present

## 2020-05-11 DIAGNOSIS — Z7982 Long term (current) use of aspirin: Secondary | ICD-10-CM | POA: Diagnosis not present

## 2020-05-11 DIAGNOSIS — Z888 Allergy status to other drugs, medicaments and biological substances status: Secondary | ICD-10-CM | POA: Diagnosis not present

## 2020-05-11 DIAGNOSIS — D62 Acute posthemorrhagic anemia: Secondary | ICD-10-CM | POA: Diagnosis not present

## 2020-05-11 DIAGNOSIS — F17210 Nicotine dependence, cigarettes, uncomplicated: Secondary | ICD-10-CM | POA: Diagnosis present

## 2020-05-11 DIAGNOSIS — I739 Peripheral vascular disease, unspecified: Secondary | ICD-10-CM | POA: Diagnosis present

## 2020-05-11 DIAGNOSIS — Z885 Allergy status to narcotic agent status: Secondary | ICD-10-CM | POA: Diagnosis not present

## 2020-05-11 DIAGNOSIS — E785 Hyperlipidemia, unspecified: Secondary | ICD-10-CM | POA: Diagnosis present

## 2020-05-11 DIAGNOSIS — I725 Aneurysm of other precerebral arteries: Secondary | ICD-10-CM | POA: Diagnosis not present

## 2020-05-11 DIAGNOSIS — Z01812 Encounter for preprocedural laboratory examination: Secondary | ICD-10-CM | POA: Insufficient documentation

## 2020-05-11 DIAGNOSIS — Z20822 Contact with and (suspected) exposure to covid-19: Secondary | ICD-10-CM | POA: Diagnosis present

## 2020-05-11 DIAGNOSIS — M79609 Pain in unspecified limb: Secondary | ICD-10-CM | POA: Diagnosis not present

## 2020-05-11 DIAGNOSIS — Z91040 Latex allergy status: Secondary | ICD-10-CM | POA: Diagnosis not present

## 2020-05-11 DIAGNOSIS — Z79899 Other long term (current) drug therapy: Secondary | ICD-10-CM | POA: Diagnosis not present

## 2020-05-11 DIAGNOSIS — Z9889 Other specified postprocedural states: Secondary | ICD-10-CM | POA: Diagnosis not present

## 2020-05-11 DIAGNOSIS — G43909 Migraine, unspecified, not intractable, without status migrainosus: Secondary | ICD-10-CM | POA: Diagnosis not present

## 2020-05-11 DIAGNOSIS — F32A Depression, unspecified: Secondary | ICD-10-CM | POA: Diagnosis present

## 2020-05-11 DIAGNOSIS — R251 Tremor, unspecified: Secondary | ICD-10-CM | POA: Diagnosis present

## 2020-05-11 DIAGNOSIS — Z86718 Personal history of other venous thrombosis and embolism: Secondary | ICD-10-CM | POA: Diagnosis not present

## 2020-05-11 DIAGNOSIS — F418 Other specified anxiety disorders: Secondary | ICD-10-CM | POA: Diagnosis not present

## 2020-05-11 DIAGNOSIS — Z7902 Long term (current) use of antithrombotics/antiplatelets: Secondary | ICD-10-CM | POA: Diagnosis not present

## 2020-05-11 DIAGNOSIS — Z91048 Other nonmedicinal substance allergy status: Secondary | ICD-10-CM | POA: Diagnosis not present

## 2020-05-11 DIAGNOSIS — G9782 Other postprocedural complications and disorders of nervous system: Secondary | ICD-10-CM | POA: Diagnosis not present

## 2020-05-11 DIAGNOSIS — I724 Aneurysm of artery of lower extremity: Secondary | ICD-10-CM | POA: Diagnosis not present

## 2020-05-11 DIAGNOSIS — K219 Gastro-esophageal reflux disease without esophagitis: Secondary | ICD-10-CM | POA: Diagnosis present

## 2020-05-11 LAB — SARS CORONAVIRUS 2 (TAT 6-24 HRS): SARS Coronavirus 2: NEGATIVE

## 2020-05-11 LAB — PLATELET INHIBITION P2Y12: Platelet Function  P2Y12: 7 [PRU] — ABNORMAL LOW (ref 182–335)

## 2020-05-11 NOTE — Progress Notes (Signed)
Anesthesia Follow-up: See my APP note from Date of Service 04/10/20. She presented for LICA aneurysm embolization on 04/11/20, but her P2y12 was 8. She was changed to only Plavix 75 mg 1/2 daily and ASA 81 mg daily. P2y12 rechecked on 05/11/20 and was 7. I spoke with Anderson Malta in IR. Patient remains on the same Plavix/ASA regimen, and currently Dr. Estanislado Pandy is planning to proceed as scheduled. Her 05/11/20 COVID-19 test is in process, and she has labs scheduled for the day of surgery.  Dr. Estanislado Pandy and anesthesia team to evaluate patient and follow-up on labs results on day of surgery.   Myra Gianotti, PA-C Surgical Short Stay/Anesthesiology [facility] Phone [phone] [facility] Phone [phone] 05/11/2020 1:19 PM

## 2020-05-11 NOTE — Telephone Encounter (Signed)
Per Dr. Estanislado Pandy pt is to now change her Plavix 75mg  1/2 tablet a day to 1/2 tablet QOD. Pt understands and agrees with this plan. She is to continue with her Aspirin 81mg  daily. She was also informed that due to the current COVID surge that there is a possibility that a bed may not be available when she comes in on Monday, Jan. 10th for her procedure. We will not proceed with treatment if there are no open beds. Pt agrees with this plan of care. JM

## 2020-05-11 NOTE — Anesthesia Preprocedure Evaluation (Addendum)
Anesthesia Evaluation  Patient identified by MRN, date of birth, ID band Patient awake    Reviewed: Allergy & Precautions, NPO status , Patient's Chart, lab work & pertinent test results  History of Anesthesia Complications (+) PONV and history of anesthetic complications  Airway Mallampati: II  TM Distance: >3 FB Neck ROM: Full    Dental  (+) Dental Advisory Given, Edentulous Upper, Missing,    Pulmonary Current Smoker and Patient abstained from smoking.,    Pulmonary exam normal breath sounds clear to auscultation       Cardiovascular + Peripheral Vascular Disease  Normal cardiovascular exam Rhythm:Regular Rate:Normal  Nuclear stress test 07/20/18: The left ventricular ejection fraction is hyperdynamic (>65%). Nuclear stress EF: 78%. Blood pressure demonstrated a normal response to exercise. There was no ST segment deviation noted during stress. The study is normal. This is a low risk study. No evidence of ischemia or MI. Normal LVEF.    Neuro/Psych  Headaches, Seizures -,  PSYCHIATRIC DISORDERS Anxiety Depression Essential tremor  LICA aneurysm    GI/Hepatic Neg liver ROS, GERD  ,  Endo/Other  negative endocrine ROS  Renal/GU negative Renal ROS     Musculoskeletal negative musculoskeletal ROS (+)   Abdominal   Peds  Hematology negative hematology ROS (+)   Anesthesia Other Findings   Reproductive/Obstetrics                           Anesthesia Physical Anesthesia Plan  ASA: IV  Anesthesia Plan: General   Post-op Pain Management:    Induction: Intravenous  PONV Risk Score and Plan: 3 and Midazolam, Dexamethasone and Ondansetron  Airway Management Planned: Oral ETT  Additional Equipment: Arterial line  Intra-op Plan:   Post-operative Plan: Extubation in OR  Informed Consent: I have reviewed the patients History and Physical, chart, labs and discussed the  procedure including the risks, benefits and alternatives for the proposed anesthesia with the patient or authorized representative who has indicated his/her understanding and acceptance.     Dental advisory given  Plan Discussed with: CRNA  Anesthesia Plan Comments: (See PAT note written on 04/10/20 with update on 05/11/20 by Myra Gianotti, PA-C. )      Anesthesia Quick Evaluation

## 2020-05-11 NOTE — Progress Notes (Signed)
Mrs. Shifrin denies chest pain or shortness of breath. Mrs Campi was tested for Covid today and is quarantining with her family.  Staff from Dr Estanislado Pandy instructed Mrs. Hipwell to take 37.5 of Plavix every other day, continue ASA and take it the am of procedure.

## 2020-05-14 ENCOUNTER — Encounter (HOSPITAL_COMMUNITY)
Admission: RE | Disposition: A | Discharge status: Home / Self Care | Payer: Self-pay | Source: Home / Self Care | Attending: Interventional Radiology

## 2020-05-14 ENCOUNTER — Ambulatory Visit (HOSPITAL_COMMUNITY)
Admission: RE | Admit: 2020-05-14 | Discharge: 2020-05-14 | Disposition: A | Discharge status: Home / Self Care | Payer: Medicare HMO | Source: Ambulatory Visit | Attending: Interventional Radiology | Admitting: Interventional Radiology

## 2020-05-14 ENCOUNTER — Inpatient Hospital Stay (HOSPITAL_COMMUNITY)
Admission: RE | Admit: 2020-05-14 | Discharge: 2020-05-17 | DRG: 026 | Disposition: A | Discharge status: Home / Self Care | Payer: Medicare HMO | Attending: Interventional Radiology | Admitting: Interventional Radiology

## 2020-05-14 ENCOUNTER — Inpatient Hospital Stay (HOSPITAL_COMMUNITY): Payer: Medicare HMO | Admitting: Anesthesiology

## 2020-05-14 ENCOUNTER — Ambulatory Visit (HOSPITAL_COMMUNITY): Payer: Medicare HMO | Admitting: Vascular Surgery

## 2020-05-14 ENCOUNTER — Other Ambulatory Visit: Payer: Self-pay

## 2020-05-14 ENCOUNTER — Encounter (HOSPITAL_COMMUNITY): Payer: Self-pay | Admitting: Interventional Radiology

## 2020-05-14 ENCOUNTER — Inpatient Hospital Stay (HOSPITAL_COMMUNITY): Payer: Medicare HMO

## 2020-05-14 DIAGNOSIS — M79604 Pain in right leg: Secondary | ICD-10-CM

## 2020-05-14 DIAGNOSIS — R251 Tremor, unspecified: Secondary | ICD-10-CM | POA: Diagnosis present

## 2020-05-14 DIAGNOSIS — I739 Peripheral vascular disease, unspecified: Secondary | ICD-10-CM | POA: Diagnosis present

## 2020-05-14 DIAGNOSIS — G40909 Epilepsy, unspecified, not intractable, without status epilepticus: Secondary | ICD-10-CM | POA: Diagnosis present

## 2020-05-14 DIAGNOSIS — Z888 Allergy status to other drugs, medicaments and biological substances status: Secondary | ICD-10-CM | POA: Diagnosis not present

## 2020-05-14 DIAGNOSIS — M79609 Pain in unspecified limb: Secondary | ICD-10-CM

## 2020-05-14 DIAGNOSIS — I724 Aneurysm of artery of lower extremity: Secondary | ICD-10-CM

## 2020-05-14 DIAGNOSIS — I725 Aneurysm of other precerebral arteries: Secondary | ICD-10-CM | POA: Diagnosis not present

## 2020-05-14 DIAGNOSIS — Z91048 Other nonmedicinal substance allergy status: Secondary | ICD-10-CM

## 2020-05-14 DIAGNOSIS — Z885 Allergy status to narcotic agent status: Secondary | ICD-10-CM

## 2020-05-14 DIAGNOSIS — Z86718 Personal history of other venous thrombosis and embolism: Secondary | ICD-10-CM

## 2020-05-14 DIAGNOSIS — F17210 Nicotine dependence, cigarettes, uncomplicated: Secondary | ICD-10-CM | POA: Diagnosis present

## 2020-05-14 DIAGNOSIS — I671 Cerebral aneurysm, nonruptured: Secondary | ICD-10-CM | POA: Diagnosis present

## 2020-05-14 DIAGNOSIS — G43909 Migraine, unspecified, not intractable, without status migrainosus: Secondary | ICD-10-CM | POA: Diagnosis not present

## 2020-05-14 DIAGNOSIS — K219 Gastro-esophageal reflux disease without esophagitis: Secondary | ICD-10-CM | POA: Diagnosis not present

## 2020-05-14 DIAGNOSIS — Z7982 Long term (current) use of aspirin: Secondary | ICD-10-CM | POA: Diagnosis not present

## 2020-05-14 DIAGNOSIS — R531 Weakness: Secondary | ICD-10-CM | POA: Diagnosis not present

## 2020-05-14 DIAGNOSIS — Z91040 Latex allergy status: Secondary | ICD-10-CM

## 2020-05-14 DIAGNOSIS — E785 Hyperlipidemia, unspecified: Secondary | ICD-10-CM | POA: Diagnosis not present

## 2020-05-14 DIAGNOSIS — D62 Acute posthemorrhagic anemia: Secondary | ICD-10-CM | POA: Diagnosis not present

## 2020-05-14 DIAGNOSIS — G9782 Other postprocedural complications and disorders of nervous system: Secondary | ICD-10-CM | POA: Diagnosis not present

## 2020-05-14 DIAGNOSIS — Z79899 Other long term (current) drug therapy: Secondary | ICD-10-CM | POA: Diagnosis not present

## 2020-05-14 DIAGNOSIS — F32A Depression, unspecified: Secondary | ICD-10-CM | POA: Diagnosis present

## 2020-05-14 DIAGNOSIS — F418 Other specified anxiety disorders: Secondary | ICD-10-CM | POA: Diagnosis not present

## 2020-05-14 DIAGNOSIS — Z9889 Other specified postprocedural states: Secondary | ICD-10-CM | POA: Diagnosis not present

## 2020-05-14 DIAGNOSIS — Z20822 Contact with and (suspected) exposure to covid-19: Secondary | ICD-10-CM | POA: Diagnosis present

## 2020-05-14 DIAGNOSIS — E782 Mixed hyperlipidemia: Secondary | ICD-10-CM | POA: Diagnosis not present

## 2020-05-14 DIAGNOSIS — Z7902 Long term (current) use of antithrombotics/antiplatelets: Secondary | ICD-10-CM

## 2020-05-14 HISTORY — PX: IR ANGIO INTRA EXTRACRAN SEL INTERNAL CAROTID UNI L MOD SED: IMG5361

## 2020-05-14 HISTORY — PX: IR ANGIOGRAM FOLLOW UP STUDY: IMG697

## 2020-05-14 HISTORY — DX: Cerebral aneurysm, nonruptured: I67.1

## 2020-05-14 HISTORY — PX: RADIOLOGY WITH ANESTHESIA: SHX6223

## 2020-05-14 HISTORY — DX: Other amnesia: R41.3

## 2020-05-14 HISTORY — PX: FALSE ANEURYSM REPAIR: SHX5152

## 2020-05-14 HISTORY — PX: APPLICATION OF WOUND VAC: SHX5189

## 2020-05-14 HISTORY — PX: IR TRANSCATH/EMBOLIZ: IMG695

## 2020-05-14 LAB — BASIC METABOLIC PANEL
Anion gap: 12 (ref 5–15)
BUN: 11 mg/dL (ref 8–23)
CO2: 25 mmol/L (ref 22–32)
Calcium: 9.3 mg/dL (ref 8.9–10.3)
Chloride: 102 mmol/L (ref 98–111)
Creatinine, Ser: 0.63 mg/dL (ref 0.44–1.00)
GFR, Estimated: >60 mL/min (ref >60)
Glucose, Bld: 113 mg/dL — ABNORMAL HIGH (ref 70–99)
Potassium: 3.6 mmol/L (ref 3.5–5.1)
Sodium: 139 mmol/L (ref 135–145)

## 2020-05-14 LAB — CBC WITH DIFFERENTIAL/PLATELET
Abs Immature Granulocytes: 0.02 10*3/uL (ref 0.00–0.07)
Basophils Absolute: 0 10*3/uL (ref 0.0–0.1)
Basophils Relative: 1 %
Eosinophils Absolute: 0.1 10*3/uL (ref 0.0–0.5)
Eosinophils Relative: 2 %
HCT: 36.2 % (ref 36.0–46.0)
Hemoglobin: 11.6 g/dL — ABNORMAL LOW (ref 12.0–15.0)
Immature Granulocytes: 0 %
Lymphocytes Relative: 41 %
Lymphs Abs: 2.2 10*3/uL (ref 0.7–4.0)
MCH: 32.7 pg (ref 26.0–34.0)
MCHC: 32 g/dL (ref 30.0–36.0)
MCV: 102 fL — ABNORMAL HIGH (ref 80.0–100.0)
Monocytes Absolute: 0.7 10*3/uL (ref 0.1–1.0)
Monocytes Relative: 12 %
Neutro Abs: 2.4 10*3/uL (ref 1.7–7.7)
Neutrophils Relative %: 44 %
Platelets: 261 10*3/uL (ref 150–400)
RBC: 3.55 MIL/uL — ABNORMAL LOW (ref 3.87–5.11)
RDW: 13.7 % (ref 11.5–15.5)
WBC: 5.4 10*3/uL (ref 4.0–10.5)
nRBC: 0 % (ref 0.0–0.2)

## 2020-05-14 LAB — POCT ACTIVATED CLOTTING TIME
Activated Clotting Time: 273 s
Activated Clotting Time: 303 seconds
Activated Clotting Time: 339 seconds
Activated Clotting Time: 351 s
Activated Clotting Time: 214 seconds
Activated Clotting Time: 136 seconds

## 2020-05-14 LAB — PROTIME-INR
INR: 1 (ref 0.8–1.2)
Prothrombin Time: 12.9 seconds (ref 11.4–15.2)

## 2020-05-14 LAB — MRSA PCR SCREENING: MRSA by PCR: POSITIVE — AB

## 2020-05-14 LAB — PLATELET INHIBITION P2Y12: Platelet Function  P2Y12: 49 [PRU] — ABNORMAL LOW (ref 182–335)

## 2020-05-14 LAB — APTT: aPTT: 35 seconds (ref 24–36)

## 2020-05-14 SURGERY — IR WITH ANESTHESIA
Anesthesia: General

## 2020-05-14 SURGERY — REPAIR, PSEUDOANEURYSM
Anesthesia: General | Laterality: Right

## 2020-05-14 MED ORDER — SODIUM CHLORIDE 0.9 % IV SOLN: INTRAVENOUS | Status: DC | PRN

## 2020-05-14 MED ORDER — CLOPIDOGREL BISULFATE 75 MG PO TABS
37.5000 mg | ORAL_TABLET | Freq: Every day | ORAL | Status: DC
  Administered 2020-05-15 – 2020-05-17 (×3): 37.5 mg via ORAL
  Filled 2020-05-14 (×3): qty 1

## 2020-05-14 MED ORDER — CEFAZOLIN SODIUM-DEXTROSE 2-4 GM/100ML-% IV SOLN
2.0000 g | Freq: Three times a day (TID) | INTRAVENOUS | Status: AC
  Administered 2020-05-14 – 2020-05-15 (×2): 2 g via INTRAVENOUS
  Filled 2020-05-14 (×2): qty 100

## 2020-05-14 MED ORDER — FENTANYL CITRATE (PF) 100 MCG/2ML IJ SOLN: 25.0000 ug | INTRAMUSCULAR | Status: DC | PRN

## 2020-05-14 MED ORDER — SUGAMMADEX SODIUM 200 MG/2ML IV SOLN
INTRAVENOUS | Status: DC | PRN
  Administered 2020-05-14: 200 mg via INTRAVENOUS

## 2020-05-14 MED ORDER — CLOPIDOGREL BISULFATE 75 MG PO TABS: 75.0000 mg | ORAL_TABLET | ORAL | Status: AC

## 2020-05-14 MED ORDER — THROMBIN (RECOMBINANT) 20000 UNITS EX SOLR
CUTANEOUS | Status: AC
  Filled 2020-05-14: qty 20000

## 2020-05-14 MED ORDER — GUAIFENESIN-DM 100-10 MG/5ML PO SYRP: 15.0000 mL | ORAL_SOLUTION | ORAL | Status: DC | PRN

## 2020-05-14 MED ORDER — SODIUM CHLORIDE 0.9 % IV SOLN
INTRAVENOUS | Status: AC
  Filled 2020-05-14: qty 1.2

## 2020-05-14 MED ORDER — ONDANSETRON HCL 4 MG/2ML IJ SOLN: 4.0000 mg | Freq: Once | INTRAMUSCULAR | Status: DC | PRN

## 2020-05-14 MED ORDER — PROPOFOL 10 MG/ML IV BOLUS
INTRAVENOUS | Status: AC
  Filled 2020-05-14: qty 40

## 2020-05-14 MED ORDER — ACETAMINOPHEN 160 MG/5ML PO SOLN: 650.0000 mg | ORAL | Status: DC | PRN

## 2020-05-14 MED ORDER — HEPARIN SODIUM (PORCINE) 1000 UNIT/ML IJ SOLN
INTRAMUSCULAR | Status: DC | PRN
  Administered 2020-05-14: 5000 [IU] via INTRAVENOUS

## 2020-05-14 MED ORDER — FENTANYL CITRATE (PF) 250 MCG/5ML IJ SOLN
INTRAMUSCULAR | Status: AC
  Filled 2020-05-14: qty 5

## 2020-05-14 MED ORDER — FENTANYL CITRATE (PF) 250 MCG/5ML IJ SOLN
INTRAMUSCULAR | Status: DC | PRN
  Administered 2020-05-14 (×2): 25 ug via INTRAVENOUS

## 2020-05-14 MED ORDER — ROCURONIUM BROMIDE 10 MG/ML (PF) SYRINGE
PREFILLED_SYRINGE | INTRAVENOUS | Status: DC | PRN
  Administered 2020-05-14: 20 mg via INTRAVENOUS
  Administered 2020-05-14: 60 mg via INTRAVENOUS
  Administered 2020-05-14: 20 mg via INTRAVENOUS

## 2020-05-14 MED ORDER — ASPIRIN EC 81 MG PO TBEC
DELAYED_RELEASE_TABLET | ORAL | Status: AC
  Administered 2020-05-14: 81 mg
  Filled 2020-05-14: qty 1

## 2020-05-14 MED ORDER — ONDANSETRON HCL 4 MG/2ML IJ SOLN
INTRAMUSCULAR | Status: DC | PRN
  Administered 2020-05-14: 4 mg via INTRAVENOUS

## 2020-05-14 MED ORDER — HEPARIN SODIUM (PORCINE) 1000 UNIT/ML IJ SOLN
INTRAMUSCULAR | Status: DC | PRN
Start: 2020-05-14 — End: 2020-05-14
  Administered 2020-05-14: 3000 [IU] via INTRAVENOUS

## 2020-05-14 MED ORDER — CLEVIDIPINE BUTYRATE 0.5 MG/ML IV EMUL: 0.0000 mg/h | INTRAVENOUS | Status: AC

## 2020-05-14 MED ORDER — SODIUM CHLORIDE 0.9 % IV SOLN: INTRAVENOUS | Status: DC

## 2020-05-14 MED ORDER — CLOPIDOGREL BISULFATE 75 MG PO TABS: 37.5000 mg | ORAL_TABLET | Freq: Every day | ORAL | Status: DC

## 2020-05-14 MED ORDER — PHENOL 1.4 % MT LIQD: 1.0000 | OROMUCOSAL | Status: DC | PRN

## 2020-05-14 MED ORDER — MORPHINE SULFATE (PF) 2 MG/ML IV SOLN
2.0000 mg | INTRAVENOUS | Status: DC | PRN
  Filled 2020-05-14: qty 1

## 2020-05-14 MED ORDER — VERAPAMIL HCL 2.5 MG/ML IV SOLN
INTRAVENOUS | Status: AC
  Filled 2020-05-14: qty 2

## 2020-05-14 MED ORDER — DEXAMETHASONE SODIUM PHOSPHATE 10 MG/ML IJ SOLN
INTRAMUSCULAR | Status: DC | PRN
  Administered 2020-05-14: 5 mg via INTRAVENOUS

## 2020-05-14 MED ORDER — NIMODIPINE 30 MG PO CAPS: 0.0000 mg | ORAL_CAPSULE | ORAL | Status: DC

## 2020-05-14 MED ORDER — LACTATED RINGERS IV SOLN: INTRAVENOUS | Status: DC | PRN

## 2020-05-14 MED ORDER — CHLORHEXIDINE GLUCONATE 0.12 % MT SOLN: 15.0000 mL | Freq: Once | OROMUCOSAL | Status: AC

## 2020-05-14 MED ORDER — PHENYLEPHRINE 40 MCG/ML (10ML) SYRINGE FOR IV PUSH (FOR BLOOD PRESSURE SUPPORT)
PREFILLED_SYRINGE | INTRAVENOUS | Status: DC | PRN
  Administered 2020-05-14 (×3): 80 ug via INTRAVENOUS

## 2020-05-14 MED ORDER — CHLORHEXIDINE GLUCONATE CLOTH 2 % EX PADS
6.0000 | MEDICATED_PAD | Freq: Every day | CUTANEOUS | Status: DC
  Administered 2020-05-16: 6 via TOPICAL

## 2020-05-14 MED ORDER — PROTAMINE SULFATE 10 MG/ML IV SOLN
INTRAVENOUS | Status: DC | PRN
  Administered 2020-05-14 (×2): 5 mg via INTRAVENOUS
  Administered 2020-05-14: 10 mg via INTRAVENOUS
  Administered 2020-05-14: 7.5 mg via INTRAVENOUS

## 2020-05-14 MED ORDER — PROPOFOL 10 MG/ML IV BOLUS
INTRAVENOUS | Status: DC | PRN
  Administered 2020-05-14: 120 mg via INTRAVENOUS

## 2020-05-14 MED ORDER — PHENYLEPHRINE HCL (PRESSORS) 10 MG/ML IV SOLN
INTRAVENOUS | Status: DC | PRN
  Administered 2020-05-14: 80 ug via INTRAVENOUS
  Administered 2020-05-14: 40 ug via INTRAVENOUS

## 2020-05-14 MED ORDER — EPTIFIBATIDE 20 MG/10ML IV SOLN
INTRAVENOUS | Status: AC
  Filled 2020-05-14: qty 10

## 2020-05-14 MED ORDER — TRAMADOL HCL 50 MG PO TABS
50.0000 mg | ORAL_TABLET | Freq: Four times a day (QID) | ORAL | Status: DC | PRN
  Administered 2020-05-14: 50 mg via ORAL
  Filled 2020-05-14: qty 1

## 2020-05-14 MED ORDER — IOHEXOL 300 MG/ML  SOLN
150.0000 mL | Freq: Once | INTRAMUSCULAR | Status: AC | PRN
  Administered 2020-05-14: 75 mL via INTRA_ARTERIAL

## 2020-05-14 MED ORDER — ASPIRIN 81 MG PO CHEW: 81.0000 mg | CHEWABLE_TABLET | Freq: Every day | ORAL | Status: DC

## 2020-05-14 MED ORDER — HEPARIN SODIUM (PORCINE) 1000 UNIT/ML IJ SOLN
INTRAMUSCULAR | Status: AC
  Filled 2020-05-14: qty 1

## 2020-05-14 MED ORDER — ORAL CARE MOUTH RINSE: 15.0000 mL | Freq: Once | OROMUCOSAL | Status: AC

## 2020-05-14 MED ORDER — CHLORHEXIDINE GLUCONATE CLOTH 2 % EX PADS
6.0000 | MEDICATED_PAD | Freq: Every day | CUTANEOUS | Status: DC
  Administered 2020-05-14: 6 via TOPICAL

## 2020-05-14 MED ORDER — LIDOCAINE HCL (PF) 1 % IJ SOLN
INTRAMUSCULAR | Status: AC | PRN
  Administered 2020-05-14: 10 mL

## 2020-05-14 MED ORDER — ROSUVASTATIN CALCIUM 5 MG PO TABS
10.0000 mg | ORAL_TABLET | Freq: Every day | ORAL | Status: DC
  Administered 2020-05-14 – 2020-05-16 (×3): 10 mg via ORAL
  Filled 2020-05-14 (×3): qty 2

## 2020-05-14 MED ORDER — FENTANYL CITRATE (PF) 100 MCG/2ML IJ SOLN
25.0000 ug | Freq: Once | INTRAMUSCULAR | Status: AC
  Administered 2020-05-14: 25 ug via INTRAVENOUS
  Filled 2020-05-14: qty 2

## 2020-05-14 MED ORDER — PHENYLEPHRINE HCL-NACL 10-0.9 MG/250ML-% IV SOLN
INTRAVENOUS | Status: DC | PRN
  Administered 2020-05-14: 25 ug/min via INTRAVENOUS

## 2020-05-14 MED ORDER — CEFAZOLIN SODIUM-DEXTROSE 2-3 GM-%(50ML) IV SOLR
INTRAVENOUS | Status: DC | PRN
  Administered 2020-05-14: 2 g via INTRAVENOUS

## 2020-05-14 MED ORDER — MIDAZOLAM HCL 2 MG/2ML IJ SOLN
INTRAMUSCULAR | Status: AC
  Filled 2020-05-14: qty 2

## 2020-05-14 MED ORDER — ROCURONIUM BROMIDE 10 MG/ML (PF) SYRINGE
PREFILLED_SYRINGE | INTRAVENOUS | Status: AC
  Filled 2020-05-14: qty 10

## 2020-05-14 MED ORDER — DIVALPROEX SODIUM ER 500 MG PO TB24
500.0000 mg | ORAL_TABLET | Freq: Every day | ORAL | Status: DC
  Administered 2020-05-14 – 2020-05-16 (×3): 500 mg via ORAL
  Filled 2020-05-14 (×4): qty 1

## 2020-05-14 MED ORDER — CLOPIDOGREL BISULFATE 75 MG PO TABS: 75.0000 mg | ORAL_TABLET | Freq: Every day | ORAL | Status: DC

## 2020-05-14 MED ORDER — PANTOPRAZOLE SODIUM 40 MG PO TBEC
40.0000 mg | DELAYED_RELEASE_TABLET | Freq: Every day | ORAL | Status: DC
  Administered 2020-05-15 – 2020-05-17 (×3): 40 mg via ORAL
  Filled 2020-05-14 (×3): qty 1

## 2020-05-14 MED ORDER — SODIUM CHLORIDE 0.9 % IV SOLN: 500.0000 mL | Freq: Once | INTRAVENOUS | Status: DC | PRN

## 2020-05-14 MED ORDER — FENTANYL CITRATE (PF) 100 MCG/2ML IJ SOLN
25.0000 ug | INTRAMUSCULAR | Status: DC | PRN
  Administered 2020-05-14: 50 ug via INTRAVENOUS
  Administered 2020-05-14 (×2): 25 ug via INTRAVENOUS

## 2020-05-14 MED ORDER — ASPIRIN 81 MG PO CHEW
81.0000 mg | CHEWABLE_TABLET | Freq: Every day | ORAL | Status: DC
  Administered 2020-05-15 – 2020-05-17 (×3): 81 mg via ORAL
  Filled 2020-05-14 (×3): qty 1

## 2020-05-14 MED ORDER — ACETAMINOPHEN 650 MG RE SUPP: 650.0000 mg | RECTAL | Status: DC | PRN

## 2020-05-14 MED ORDER — CEFAZOLIN SODIUM-DEXTROSE 2-4 GM/100ML-% IV SOLN
2.0000 g | INTRAVENOUS | Status: AC
  Administered 2020-05-14: 2 g via INTRAVENOUS

## 2020-05-14 MED ORDER — PHENYLEPHRINE HCL (PRESSORS) 10 MG/ML IV SOLN
INTRAVENOUS | Status: AC
  Filled 2020-05-14: qty 1

## 2020-05-14 MED ORDER — ONDANSETRON HCL 4 MG/2ML IJ SOLN: 4.0000 mg | Freq: Four times a day (QID) | INTRAMUSCULAR | Status: DC | PRN

## 2020-05-14 MED ORDER — PROMETHAZINE HCL 25 MG/ML IJ SOLN: 6.2500 mg | INTRAMUSCULAR | Status: DC | PRN

## 2020-05-14 MED ORDER — MUPIROCIN 2 % EX OINT
1.0000 "application " | TOPICAL_OINTMENT | Freq: Two times a day (BID) | CUTANEOUS | Status: DC
  Administered 2020-05-14 – 2020-05-17 (×6): 1 via NASAL
  Filled 2020-05-14 (×2): qty 22

## 2020-05-14 MED ORDER — SODIUM CHLORIDE 0.9 % IV SOLN: INTRAVENOUS | Status: AC

## 2020-05-14 MED ORDER — LIDOCAINE 2% (20 MG/ML) 5 ML SYRINGE
INTRAMUSCULAR | Status: DC | PRN
  Administered 2020-05-14: 60 mg via INTRAVENOUS

## 2020-05-14 MED ORDER — DEXAMETHASONE SODIUM PHOSPHATE 10 MG/ML IJ SOLN
INTRAMUSCULAR | Status: DC | PRN
  Administered 2020-05-14: 4 mg via INTRAVENOUS

## 2020-05-14 MED ORDER — CEFAZOLIN SODIUM-DEXTROSE 2-4 GM/100ML-% IV SOLN
INTRAVENOUS | Status: AC
  Filled 2020-05-14: qty 100

## 2020-05-14 MED ORDER — FENTANYL CITRATE (PF) 100 MCG/2ML IJ SOLN
INTRAMUSCULAR | Status: DC | PRN
  Administered 2020-05-14: 75 ug via INTRAVENOUS
  Administered 2020-05-14: 25 ug via INTRAVENOUS
  Administered 2020-05-14: 50 ug via INTRAVENOUS

## 2020-05-14 MED ORDER — HEPARIN (PORCINE) 25000 UT/250ML-% IV SOLN
500.0000 [IU]/h | INTRAVENOUS | Status: DC
  Filled 2020-05-14: qty 250

## 2020-05-14 MED ORDER — FENTANYL CITRATE (PF) 100 MCG/2ML IJ SOLN
INTRAMUSCULAR | Status: AC
  Filled 2020-05-14: qty 2

## 2020-05-14 MED ORDER — 0.9 % SODIUM CHLORIDE (POUR BTL) OPTIME
TOPICAL | Status: DC | PRN
  Administered 2020-05-14 (×2): 1000 mL

## 2020-05-14 MED ORDER — LACTATED RINGERS IV SOLN: INTRAVENOUS | Status: DC

## 2020-05-14 MED ORDER — HYDRALAZINE HCL 20 MG/ML IJ SOLN: 5.0000 mg | INTRAMUSCULAR | Status: DC | PRN

## 2020-05-14 MED ORDER — ASPIRIN EC 325 MG PO TBEC: 325.0000 mg | DELAYED_RELEASE_TABLET | ORAL | Status: DC

## 2020-05-14 MED ORDER — LIDOCAINE 2% (20 MG/ML) 5 ML SYRINGE
INTRAMUSCULAR | Status: AC
  Filled 2020-05-14: qty 5

## 2020-05-14 MED ORDER — NITROGLYCERIN 1 MG/10 ML FOR IR/CATH LAB
INTRA_ARTERIAL | Status: AC
  Filled 2020-05-14: qty 20

## 2020-05-14 MED ORDER — CLOPIDOGREL BISULFATE 75 MG PO TABS
37.5000 mg | ORAL_TABLET | Freq: Every day | ORAL | Status: DC
Start: 2020-05-15 — End: 2020-05-17

## 2020-05-14 MED ORDER — LIDOCAINE HCL 1 % IJ SOLN
INTRAMUSCULAR | Status: AC
  Filled 2020-05-14: qty 20

## 2020-05-14 MED ORDER — PROPOFOL 10 MG/ML IV BOLUS
INTRAVENOUS | Status: DC | PRN
  Administered 2020-05-14: 130 mg via INTRAVENOUS

## 2020-05-14 MED ORDER — CLOPIDOGREL BISULFATE 75 MG PO TABS
ORAL_TABLET | ORAL | Status: AC
  Administered 2020-05-14: 75 mg via ORAL
  Filled 2020-05-14: qty 1

## 2020-05-14 MED ORDER — CHLORHEXIDINE GLUCONATE 0.12 % MT SOLN
OROMUCOSAL | Status: AC
  Administered 2020-05-14: 15 mL via OROMUCOSAL
  Filled 2020-05-14: qty 15

## 2020-05-14 MED ORDER — ACETAMINOPHEN 325 MG PO TABS
650.0000 mg | ORAL_TABLET | ORAL | Status: DC | PRN
  Administered 2020-05-15 – 2020-05-17 (×5): 650 mg via ORAL
  Filled 2020-05-14 (×5): qty 2

## 2020-05-14 MED ORDER — EPTIFIBATIDE 20 MG/10ML IV SOLN
INTRAVENOUS | Status: AC | PRN
  Administered 2020-05-14: 1.5 mg via INTRAVENOUS

## 2020-05-14 SURGICAL SUPPLY — 57 items
BANDAGE ESMARK 6X9 LF (GAUZE/BANDAGES/DRESSINGS) IMPLANT
BNDG ELASTIC 4X5.8 VLCR STR LF (GAUZE/BANDAGES/DRESSINGS) IMPLANT
BNDG ESMARK 6X9 LF (GAUZE/BANDAGES/DRESSINGS)
CANISTER SUCT 3000ML PPV (MISCELLANEOUS) ×2 IMPLANT
CANISTER WOUND CARE 500ML ATS (WOUND CARE) ×2 IMPLANT
CANNULA VESSEL 3MM 2 BLNT TIP (CANNULA) ×2 IMPLANT
CLIP VESOCCLUDE MED 24/CT (CLIP) ×4 IMPLANT
CLIP VESOCCLUDE SM WIDE 24/CT (CLIP) ×4 IMPLANT
COVER SURGICAL LIGHT HANDLE (MISCELLANEOUS) ×2 IMPLANT
COVER WAND RF STERILE (DRAPES) IMPLANT
CUFF TOURN SGL QUICK 18X4 (TOURNIQUET CUFF) IMPLANT
CUFF TOURN SGL QUICK 24 (TOURNIQUET CUFF)
CUFF TOURN SGL QUICK 34 (TOURNIQUET CUFF)
CUFF TOURN SGL QUICK 42 (TOURNIQUET CUFF) IMPLANT
CUFF TRNQT CYL 24X4X16.5-23 (TOURNIQUET CUFF) IMPLANT
CUFF TRNQT CYL 34X4.125X (TOURNIQUET CUFF) IMPLANT
DRAIN CHANNEL 15F RND FF W/TCR (WOUND CARE) IMPLANT
ELECT REM PT RETURN 9FT ADLT (ELECTROSURGICAL) ×2
ELECTRODE REM PT RTRN 9FT ADLT (ELECTROSURGICAL) ×1 IMPLANT
EVACUATOR SILICONE 100CC (DRAIN) IMPLANT
GLOVE BIO SURGEON STRL SZ7.5 (GLOVE) IMPLANT
GLOVE BIOGEL PI IND STRL 8 (GLOVE) IMPLANT
GLOVE BIOGEL PI INDICATOR 8 (GLOVE)
GLOVE SURG SS PI 7.5 STRL IVOR (GLOVE) ×2 IMPLANT
GLOVE SURG SS PI 8.0 STRL IVOR (GLOVE) ×2 IMPLANT
GLOVE SURG UNDER POLY LF SZ6.5 (GLOVE) ×4 IMPLANT
GOWN STRL REUS W/ TWL LRG LVL3 (GOWN DISPOSABLE) ×2 IMPLANT
GOWN STRL REUS W/TWL LRG LVL3 (GOWN DISPOSABLE) ×4
KIT BASIN OR (CUSTOM PROCEDURE TRAY) ×2 IMPLANT
KIT DRSG PREVENA PLUS 7DAY 125 (MISCELLANEOUS) ×2 IMPLANT
KIT PREVENA INCISION MGT 13 (CANNISTER) ×2 IMPLANT
KIT TURNOVER KIT B (KITS) ×2 IMPLANT
NS IRRIG 1000ML POUR BTL (IV SOLUTION) ×2 IMPLANT
PACK PERIPHERAL VASCULAR (CUSTOM PROCEDURE TRAY) ×2 IMPLANT
PAD ARMBOARD 7.5X6 YLW CONV (MISCELLANEOUS) ×4 IMPLANT
SPONGE SURGIFOAM ABS GEL 100 (HEMOSTASIS) IMPLANT
STAPLER VISISTAT (STAPLE) IMPLANT
STAPLER VISISTAT 35W (STAPLE) ×2 IMPLANT
SUT ETHILON 2 0 PSLX (SUTURE) ×4 IMPLANT
SUT PROLENE 5 0 C 1 24 (SUTURE) ×2 IMPLANT
SUT PROLENE 6 0 BV (SUTURE) ×10 IMPLANT
SUT SILK 2 0 PERMA HAND 18 BK (SUTURE) IMPLANT
SUT VIC AB 2-0 CT1 27 (SUTURE) ×1
SUT VIC AB 2-0 CT1 36 (SUTURE) ×2 IMPLANT
SUT VIC AB 2-0 CT1 TAPERPNT 27 (SUTURE) ×1 IMPLANT
SUT VIC AB 2-0 CTB1 (SUTURE) IMPLANT
SUT VIC AB 3-0 SH 27 (SUTURE) ×2
SUT VIC AB 3-0 SH 27X BRD (SUTURE) ×1 IMPLANT
SUT VIC AB 4-0 PS2 18 (SUTURE) ×2 IMPLANT
SUT VICRYL 4-0 PS2 18IN ABS (SUTURE) IMPLANT
SYR BULB IRRIG 60ML STRL (SYRINGE) ×2 IMPLANT
TOWEL GREEN STERILE (TOWEL DISPOSABLE) ×2 IMPLANT
TRAY FOL W/BAG SLVR 16FR STRL (SET/KITS/TRAYS/PACK) ×2 IMPLANT
TRAY FOLEY MTR SLVR 16FR STAT (SET/KITS/TRAYS/PACK) ×2 IMPLANT
TRAY FOLEY W/BAG SLVR 16FR LF (SET/KITS/TRAYS/PACK) ×2
UNDERPAD 30X36 HEAVY ABSORB (UNDERPADS AND DIAPERS) ×2 IMPLANT
WATER STERILE IRR 1000ML POUR (IV SOLUTION) ×2 IMPLANT

## 2020-05-14 NOTE — Anesthesia Procedure Notes (Signed)
Arterial Line Insertion Start/End1/02/2021 8:00 AM, 05/14/2020 8:10 AM Performed by: Lowella Dell, CRNA, CRNA  Patient location: Pre-op. Preanesthetic checklist: patient identified, IV checked, site marked, risks and benefits discussed, surgical consent, monitors and equipment checked, pre-op evaluation, timeout performed and anesthesia consent Lidocaine 1% used for infiltration and patient sedated Left, radial was placed Catheter size: 20 G Hand hygiene performed  and maximum sterile barriers used   Attempts: 1 Procedure performed without using ultrasound guided technique. Following insertion, dressing applied and Biopatch. Post procedure assessment: normal  Patient tolerated the procedure well with no immediate complications.

## 2020-05-14 NOTE — Anesthesia Postprocedure Evaluation (Signed)
Anesthesia Post Note  Patient: Lindsay Erickson  Procedure(s) Performed: REPAIR FALSE ANEURYSM (Right ) APPLICATION OF WOUND VAC (Right )     Patient location during evaluation: PACU Anesthesia Type: General Level of consciousness: awake Pain management: pain level controlled Vital Signs Assessment: post-procedure vital signs reviewed and stable Respiratory status: spontaneous breathing Cardiovascular status: stable Postop Assessment: no apparent nausea or vomiting Anesthetic complications: no   No complications documented.  Last Vitals:  Vitals:   05/14/20 2200 05/14/20 2300  BP:  (!) 72/59  Pulse: 78 75  Resp: 18 18  Temp:    SpO2: 99% 100%    Last Pain:  Vitals:   05/14/20 2000  TempSrc: Axillary  PainSc:                  Cheris Tweten

## 2020-05-14 NOTE — Transfer of Care (Signed)
Immediate Anesthesia Transfer of Care Note  Patient: Lindsay Erickson  Procedure(s) Performed: IR WITH ANESTHESIA ANEURYSM EMBOLIZATION (N/A )  Patient Location: PACU  Anesthesia Type:General  Level of Consciousness: awake, alert  and oriented  Airway & Oxygen Therapy: Patient Spontanous Breathing and Patient connected to face mask oxygen  Post-op Assessment: Report given to RN and Post -op Vital signs reviewed and stable  Post vital signs: Reviewed and stable  Last Vitals:  Vitals Value Taken Time  BP 113/94 05/14/20 1243  Temp    Pulse 75 05/14/20 1248  Resp 14 05/14/20 1248  SpO2 100 % 05/14/20 1248  Vitals shown include unvalidated device data.  Last Pain:  Vitals:   05/14/20 0713  TempSrc:   PainSc: 0-No pain      Patients Stated Pain Goal: 3 (26/71/24 5809)  Complications: No complications documented.

## 2020-05-14 NOTE — Progress Notes (Signed)
VASCULAR LAB    Ultrasound of right groin has been performed.  See CV proc for preliminary results.   Tylasia Fletchall, RVT 05/14/2020, 4:00 PM

## 2020-05-14 NOTE — Sedation Documentation (Signed)
Sheath pulled at 1116, manual pressure held, pressure released at 1148. Dressing is quick clot, gauze, and tegaderm.

## 2020-05-14 NOTE — Anesthesia Procedure Notes (Signed)
Procedure Name: Intubation Date/Time: 05/14/2020 8:49 AM Performed by: Lowella Dell, CRNA Pre-anesthesia Checklist: Patient identified, Emergency Drugs available, Suction available and Patient being monitored Patient Re-evaluated:Patient Re-evaluated prior to induction Oxygen Delivery Method: Circle System Utilized Preoxygenation: Pre-oxygenation with 100% oxygen Induction Type: IV induction Ventilation: Mask ventilation without difficulty Laryngoscope Size: Mac and 3 Tube type: Oral Tube size: 6.5 mm Number of attempts: 1 Airway Equipment and Method: Stylet Placement Confirmation: ETT inserted through vocal cords under direct vision,  positive ETCO2 and breath sounds checked- equal and bilateral Secured at: 20 cm Tube secured with: Tape Dental Injury: Teeth and Oropharynx as per pre-operative assessment

## 2020-05-14 NOTE — Procedures (Addendum)
S/P Lt common carotid artrriogram RT CFA approach. 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 Post procedure extubated. Maintaining O2 sats. O/E alert  . Denies any H/As,N/V visual or speech changes. Pupils 2 to 3 mm RT =Lt. No gross facial asymmetry. Tongue in the midline. Moving all 4s.  RT groin pressure held for hemostasis. Painful firm  hematoma felt just below groin puncture site. Pressure held for 30 mins. Distal pulses dopplerable. Will obtain stat US to R/O pseudoaneurysm. Hemodynamically stable S.Lindsay Lopezperez MD

## 2020-05-14 NOTE — Anesthesia Procedure Notes (Signed)
Procedure Name: LMA Insertion Date/Time: 05/14/2020 5:20 PM Performed by: Alain Marion, CRNA Pre-anesthesia Checklist: Patient identified, Emergency Drugs available, Suction available and Patient being monitored Patient Re-evaluated:Patient Re-evaluated prior to induction Oxygen Delivery Method: Circle System Utilized Preoxygenation: Pre-oxygenation with 100% oxygen Induction Type: IV induction Ventilation: Mask ventilation without difficulty LMA: LMA inserted LMA Size: 4.0 Number of attempts: 1 Airway Equipment and Method: Bite block Placement Confirmation: positive ETCO2 Tube secured with: Tape Dental Injury: Teeth and Oropharynx as per pre-operative assessment

## 2020-05-14 NOTE — Progress Notes (Signed)
Pt arrived to ICU w/ pain and swelling at and around R groin site, Dr. Estanislado Pandy notified.  Verbal order for fentanyl 25 mcg IV push for pain taken.  Ultrasound called to come to bedside.

## 2020-05-14 NOTE — H&P (Signed)
ASSESSMENT & PLAN:  60 y.o. 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 To OR for urgent direct repair  CHIEF COMPLAINT:   Right groin pain  HISTORY:  HISTORY OF PRESENT ILLNESS: Lindsay Erickson is a 60 y.o. female who underwent intracranial embolization for paraopthalmic aneurysm via right common femoral artery approach today. Hemostasis was difficult to achieve post procedure. An ultrasound was performed showing large pseudoaneurysm. The patient's only complaint is right groin pain. Her distal right leg is not symptomatic.  Past Medical History:  Diagnosis Date  . Acid reflux   . Anemia   . Anxiety   . Constipation     05/11/20- improved  . Depression   . Dyslipidemia   . Essential tremor   . History of kidney stones   . Memory loss   . Migraine    cluster headache occasional  . Peripheral vascular disease (HCC)    blood clot in leg after being kicked by a horse - age 72  . Pneumonia   . PONV (postoperative nausea and vomiting)    after breast biopsy and headache.  . Seizure disorder [facility])     Past Surgical History:  Procedure Laterality Date  . ABDOMINAL HYSTERECTOMY    . BREAST BIOPSY Left    X3  . CATARACT EXTRACTION Bilateral   . IR ANGIO INTRA EXTRACRAN SEL COM CAROTID INNOMINATE BILAT MOD SED  01/12/2020  . IR ANGIO VERTEBRAL SEL VERTEBRAL BILAT MOD SED  01/12/2020  . IR US GUIDE VASC ACCESS RIGHT  01/12/2020  . SALPINGOOPHORECTOMY     both ovaries removed    Family History  Problem Relation Age of Onset  . Heart disease Mother   . Stroke Mother   . Heart disease Father   . Atrial fibrillation Sister   . Diabetes Brother   . Diabetes Maternal Grandmother   . Heart attack Maternal Uncle   . Heart attack Maternal Uncle     Social History   Socioeconomic History  . Marital status: Married    Spouse name: Not on file  . Number of children: Not on file  . Years of education: Not on file  . Highest education  level: Not on file  Occupational History  . Not on file  Tobacco Use  . Smoking status: Current Every Day Smoker    Packs/day: 0.33    Types: Cigarettes  . Smokeless tobacco: Never Used  Vaping Use  . Vaping Use: Never used  Substance and Sexual Activity  . Alcohol use: Never  . Drug use: Never  . Sexual activity: Not on file  Other Topics Concern  . Not on file  Social History Narrative   One story home mobile home   Right handed   Drinks caffeine on occasion   Social Determinants of Health   Financial Resource Strain: Not on file  Food Insecurity: Not on file  Transportation Needs: Not on file  Physical Activity: Not on file  Stress: Not on file  Social Connections: Not on file  Intimate Partner Violence: Not on file    Allergies  Allergen Reactions  . Gabapentin Other (See Comments)    insomnia  . Primidone Other (See Comments)    Ineffective   . Propranolol Swelling    Can't take beta blockers  . Venlafaxine Other (See Comments)    Irritable/hyper  . Latex Itching  . Nickel   . Codeine Itching    Current Facility-Administered Medications  Medication Dose Route  Frequency Provider Last Rate Last Admin  . 0.9 %  sodium chloride infusion   Intravenous Continuous [name], MD 75 mL/hr at 05/14/20 1600 Infusion Verify at 05/14/20 1600  . 0.9 % irrigation (POUR BTL)    PRN Cherre Robins, MD   1,000 mL at 05/14/20 1702  . [MAR Hold] acetaminophen (TYLENOL) tablet 650 mg  650 mg Oral Q4H PRN [name], MD       Or  . Doug Sou Hold] acetaminophen (TYLENOL) 160 MG/5ML solution 650 mg  650 mg Per Tube Q4H PRN Deveshwar, Willaim Rayas, MD       Or  . Doug Sou Hold] acetaminophen (TYLENOL) suppository 650 mg  650 mg Rectal Q4H PRN [name], MD      . Doug Sou Hold] aspirin chewable tablet 81 mg  81 mg Oral Daily Deveshwar, Sanjeev, MD       Or  . Doug Sou Hold] aspirin chewable tablet 81 mg  81 mg Per Tube Daily Deveshwar, Willaim Rayas, MD      . Doug Sou Hold]  aspirin EC tablet 325 mg  325 mg Oral 60 min Pre-Op [name], MD      . Doug Sou Hold] Chlorhexidine Gluconate Cloth 2 % PADS 6 each  6 each Topical Q0600 Cherre Robins, MD      . clevidipine (CLEVIPREX) infusion 0.5 mg/mL  0-21 mg/hr Intravenous Continuous Deveshwar, Willaim Rayas, MD      . Doug Sou Hold] clopidogrel (PLAVIX) tablet 37.5 mg  37.5 mg Per Tube Daily Deveshwar, Sanjeev, MD       Or  . Doug Sou Hold] clopidogrel (PLAVIX) tablet 37.5 mg  37.5 mg Oral Daily Deveshwar, Willaim Rayas, MD      . Doug Sou Hold] divalproex (DEPAKOTE ER) 24 hr tablet 500 mg  500 mg Oral QHS Louk, Alexandra M, PA-C      . heparin 6,000 Units in sodium chloride 0.9 % 500 mL irrigation    PRN Cherre Robins, MD   Given at 05/14/20 1657  . lactated ringers infusion   Intravenous Continuous Duane Boston, MD 10 mL/hr at 05/14/20 1636 New Bag at 05/14/20 1636  . [MAR Hold] mupirocin ointment (BACTROBAN) 2 % 1 application  1 application Nasal BID Louk, Alexandra M, PA-C      . [MAR Hold] niMODipine (NIMOTOP) capsule 0-60 mg  0-60 mg Oral 60 min Pre-Op [name], MD      . Doug Sou Hold] rosuvastatin (CRESTOR) tablet 10 mg  10 mg Oral QHS Louk, Alexandra M, PA-C       Facility-Administered Medications Ordered in Other Encounters  Medication Dose Route Frequency Provider Last Rate Last Admin  . heparin sodium (porcine) 1000 UNIT/ML injection           . lidocaine (XYLOCAINE) 1 % (with pres) injection           . nitroGLYCERIN 100 mcg/mL intra-arterial injection           . verapamil (ISOPTIN) 2.5 MG/ML injection             REVIEW OF SYSTEMS:  [X]  denotes positive finding, [ ]  denotes negative finding Cardiac  Comments:  Chest pain or chest pressure:    Shortness of breath upon exertion:    Short of breath when lying flat:    Irregular heart rhythm:        Vascular    Pain in calf, thigh, or hip brought on by ambulation:    Pain in feet at night that wakes you up from your sleep:  Blood clot in your veins:     Leg swelling:         Pulmonary    Oxygen at home:    Productive cough:     Wheezing:         Neurologic    Sudden weakness in arms or legs:     Sudden numbness in arms or legs:     Sudden onset of difficulty speaking or slurred speech:    Temporary loss of vision in one eye:     Problems with dizziness:         Gastrointestinal    Blood in stool:     Vomited blood:         Genitourinary    Burning when urinating:     Blood in urine:        Psychiatric    Major depression:         Hematologic    Bleeding problems:    Problems with blood clotting too easily:        Skin    Rashes or ulcers:        Constitutional    Fever or chills:     PHYSICAL EXAM:   Vitals:   05/14/20 1545 05/14/20 1600 05/14/20 1615 05/14/20 1630  BP: 99/79 108/63 96/60 110/69  Pulse: 77 73 71   Resp: 16 15 (!) 26   Temp:  97.9 F (36.6 C)    TempSrc:  Oral    SpO2: 98% 100% 99%   Weight:      Height:       Constitutional: Well appearing in no distress. Appears well nourished.  Neurologic: Normal gait and station. CN intact. No weakness. No sensory loss. Psychiatric: Mood and affect symmetric and appropriate. Eyes: No icterus. No conjunctival pallor. Ears, nose, throat: mucous membranes moist. Midline trachea. No carotid bruit. Cardiac: regular rate and rhythm.  Respiratory: unlabored. Abdominal: soft, non-tender, non-distended. No palpable pulsatile abdominal mass. Peripheral vascular:  Palpable pulsatile mass in R groin  Bedside ultrasound shows wide pseudoaneurysm with very broad neck Extremity: No edema. No cyanosis. No pallor.  Skin: No gangrene. No ulceration.  Lymphatic: No Stemmer's sign. No palpable lymphadenopathy.   DATA REVIEW:    Most recent CBC CBC Latest Ref Rng & Units 05/14/2020 04/11/2020 01/12/2020  WBC 4.0 - 10.5 K/uL 5.4 6.2 6.5  Hemoglobin 12.0 - 15.0 g/dL 11.6(L) 12.7 13.5  Hematocrit 36.0 - 46.0 % 36.2 39.8 42.6  Platelets 150 - 400 K/uL 261 248 229      Most recent CMP CMP Latest Ref Rng & Units 05/14/2020 04/11/2020 01/12/2020  Glucose 70 - 99 mg/dL 113(H) 109(H) 97  BUN 8 - 23 mg/dL 11 26(H) 20  Creatinine 0.44 - 1.00 mg/dL 0.63 0.69 0.69  Sodium 135 - 145 mmol/L 139 139 141  Potassium 3.5 - 5.1 mmol/L 3.6 3.4(L) 3.9  Chloride 98 - 111 mmol/L 102 102 106  CO2 22 - 32 mmol/L 25 24 23   Calcium 8.9 - 10.3 mg/dL 9.3 9.4 9.6  Total Protein 6.0 - 8.5 g/dL - - -  Total Bilirubin 0.0 - 1.2 mg/dL - - -  Alkaline Phos 39 - 117 IU/L - - -  AST 0 - 40 IU/L - - -  ALT 0 - 32 IU/L - - -    Renal function Estimated Creatinine Clearance: 58.4 mL/min (by C-G formula based on SCr of 0.63 mg/dL).  No results found for: HGBA1C  LDL Chol Calc (NIH)  Date  Value Ref Range Status  02/25/2019 78 0 - 99 mg/dL Final     Yevonne Aline. Stanford Breed, MD [facility] Phone Number: [phone] 05/14/2020 5:03 PM

## 2020-05-14 NOTE — Op Note (Signed)
DATE OF SERVICE: 05/14/2020  PATIENT:  Lindsay Erickson  60 y.o. female  PRE-OPERATIVE DIAGNOSIS:  Right femoral artery pseudoaneurysm  POST-OPERATIVE DIAGNOSIS:  Same  PROCEDURE:   Direct repair of right common femoral artery pseudoaneurysm  SURGEON:  Surgeon(s) and Role:    * Cherre Robins, MD - Primary  ASSISTANT: Arlee Muslim, PA-C  An assistant was required to facilitate exposure and expedite the case.  ANESTHESIA:   general  EBL: 167mL  BLOOD ADMINISTERED:none  DRAINS: none   LOCAL MEDICATIONS USED:  NONE  SPECIMEN:  none  COUNTS: confirmed correct.  TOURNIQUET:  none  PATIENT DISPOSITION:  PACU - hemodynamically stable.   Delay start of Pharmacological VTE agent (>24hrs) due to surgical blood loss or risk of bleeding: no  INDICATION FOR PROCEDURE: [name] is a 60 y.o. female with right common femoral artery pseudoaneurysm after neurointerventional procedure. After careful discussion of risks, benefits, and alternatives the patient was offered exploration with direct repair. The patient understood and wished to proceed.  OPERATIVE FINDINGS: Anterior common femoral artery arteriotomy repaired with interrupted prolene x 4.  DESCRIPTION OF PROCEDURE: After identification of the patient in the pre-operative holding area, the patient was transferred to the operating room. The patient was positioned supine on the operating room table. Anesthesia was induced. The right groin was prepped and draped in standard fashion. A surgical pause was performed confirming correct patient, procedure, and operative location.  A longitudinal incision was made over the right groin over the expected course of the femoral arteries.  The incision was carried down through subcutaneous tissue.  The subcutaneous tissue was edematous and had evidence of underlying hematoma.  The capsule of the pseudoaneurysm was entered with Bovie electrocautery.  Arterial bleeding was encountered.   This was controlled digitally.  Superficial femoral artery was exposed distally in the incision.  This was followed cranially until the arteriotomy was identified.  The more proximal common femoral artery was then identified.  Digital pressure was held over the arteriotomy until 5000 units of IV heparin could circulate for 3 minutes.  Digital pressure was removed and the artery clamped proximally distally.  The arteriotomy was slightly widened with a Potts scissor to allow visualization of the lumen.  Inflow and outflow was tested and found to be robust.  The arteriotomy was repaired with interrupted 6-0 Prolene sutures.  Hemostasis was excellent.  Doppler flow was confirmed in the foot after repair.  The wound was closed in layers using 2-0 Vicryl and 3 layers followed by 2-0 nylon and surgical stapler.  A Prevena VAC was applied to the groin to facilitate drainage.  Upon completion of the case instrument and sharps counts were confirmed correct. The patient was transferred to the PACU in good condition. I was present for all portions of the procedure.  Yevonne Aline. Stanford Breed, MD [facility] Phone Number: [phone] 05/14/2020 6:29 PM

## 2020-05-14 NOTE — Transfer of Care (Addendum)
Immediate Anesthesia Transfer of Care Note  Patient: Lindsay Erickson  Procedure(s) Performed: REPAIR FALSE ANEURYSM (Right ) APPLICATION OF WOUND VAC (Right )  Patient Location: PACU  Anesthesia Type:General  Level of Consciousness: awake, alert  and oriented  Airway & Oxygen Therapy: Patient Spontanous Breathing and Patient connected to face mask oxygen  Post-op Assessment: Report given to RN and Post -op Vital signs reviewed and stable  Post vital signs: Reviewed and stable  Last Vitals:  Vitals Value Taken Time  BP 111/65 05/14/20 1845  Temp    Pulse 80 05/14/20 1847  Resp 23 05/14/20 1847  SpO2 100 % 05/14/20 1847  Vitals shown include unvalidated device data.  Last Pain:  Vitals:   05/14/20 1600  TempSrc: Oral  PainSc:       Patients Stated Pain Goal: 3 (54/62/70 3500)  Complications: No complications documented.

## 2020-05-14 NOTE — Anesthesia Preprocedure Evaluation (Addendum)
Anesthesia Evaluation  Patient identified by MRN, date of birth, ID band Patient awake    Reviewed: Allergy & Precautions, NPO status , Patient's Chart, lab work & pertinent test results  History of Anesthesia Complications (+) PONV and history of anesthetic complications  Airway Mallampati: II  TM Distance: >3 FB Neck ROM: Full    Dental  (+) Dental Advisory Given, Edentulous Upper, Missing,    Pulmonary Current Smoker and Patient abstained from smoking.,    Pulmonary exam normal breath sounds clear to auscultation       Cardiovascular + Peripheral Vascular Disease  Normal cardiovascular exam Rhythm:Regular Rate:Normal  Nuclear stress test 07/20/18: The left ventricular ejection fraction is hyperdynamic (>65%). Nuclear stress EF: 78%. Blood pressure demonstrated a normal response to exercise. There was no ST segment deviation noted during stress. The study is normal. This is a low risk study. No evidence of ischemia or MI. Normal LVEF.    Neuro/Psych  Headaches, Seizures -,  PSYCHIATRIC DISORDERS Anxiety Depression Essential tremor  LICA aneurysm    GI/Hepatic Neg liver ROS, GERD  ,  Endo/Other  negative endocrine ROS  Renal/GU negative Renal ROS     Musculoskeletal negative musculoskeletal ROS (+)   Abdominal   Peds  Hematology negative hematology ROS (+)   Anesthesia Other Findings   Reproductive/Obstetrics                             Anesthesia Physical  Anesthesia Plan  ASA: IV  Anesthesia Plan: General   Post-op Pain Management:    Induction: Intravenous  PONV Risk Score and Plan: 3 and Midazolam, Dexamethasone and Ondansetron  Airway Management Planned: LMA  Additional Equipment: Arterial line  Intra-op Plan:   Post-operative Plan: Extubation in OR  Informed Consent: I have reviewed the patients History and Physical, chart, labs and discussed the  procedure including the risks, benefits and alternatives for the proposed anesthesia with the patient or authorized representative who has indicated his/her understanding and acceptance.     Dental advisory given  Plan Discussed with: CRNA and Anesthesiologist  Anesthesia Plan Comments: (See PAT note written on 04/10/20 with update on 05/11/20 by Myra Gianotti, PA-C. )       Anesthesia Quick Evaluation

## 2020-05-14 NOTE — H&P (Signed)
Chief Complaint: Patient was seen in consultation today for cerebral arteriogram with possible L ICA aneurysm embolization at the request of Dr Delice Lesch   Supervising Physician: [name]  Patient Status: [facility] - Out-pt  History of Present Illness: Lindsay Erickson is a 60 y.o. female   Hx Sz disorder; essential tremors Recent discovered cerebral aneurysms  Pt known to NIR Was scheduled 04/11/20 for L ICA aneurysm embolization p2y12 was 8-- procedure canceled that day. 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  Referred to Dr Estanislado Pandy for evaluation and management of same She has family Hx: Mother passed with ruptured cerebral aneurysm   Was seen in consultation 12/29/19  Cerebral arteriogram performed 01/12/20 IMPRESSION: Approximately 8 mm x 5.1 mm saccular aneurysm with a neck of 4.2 mm of the left internal carotid artery just distal to the origin of the ophthalmic artery. Approximately 7.2 mm x 5 mm saccular aneurysm arising from the right internal carotid artery in the region of the ophthalmic artery projecting laterally and slightly inferiorly.  Scheduled now for Left internal carotid artery aneurysm embolization -- if labs appropriate Possible R ICA aneurysm embolization possible at later date   Past Medical History:  Diagnosis Date  . Acid reflux   . Anemia   . Anxiety   . Constipation      05/11/20- improved  . Depression   . Dyslipidemia   . Essential tremor   . History of kidney stones   . Memory loss   . Migraine    cluster headache occasional  . Peripheral vascular disease ([place])    blood clot in leg after being kicked by a horse - age 57  . Pneumonia   . PONV (postoperative nausea and vomiting)    after breast biopsy and headache.  . Seizure disorder [facility])     Past Surgical History:  Procedure Laterality Date  . ABDOMINAL HYSTERECTOMY    . BREAST BIOPSY Left    X3  . CATARACT EXTRACTION Bilateral   . IR ANGIO INTRA EXTRACRAN SEL COM CAROTID INNOMINATE BILAT MOD SED  01/12/2020  . IR ANGIO VERTEBRAL SEL VERTEBRAL BILAT MOD SED  01/12/2020  . IR US GUIDE VASC ACCESS RIGHT  01/12/2020  . SALPINGOOPHORECTOMY     both ovaries removed    Allergies: Gabapentin, Primidone, Propranolol, Venlafaxine, Latex, Nickel, and Codeine  Medications: Prior to Admission medications   Medication Sig Start Date End Date Taking? Authorizing Provider  acetaminophen (TYLENOL) 500 MG tablet Take 500 mg by mouth every 8 (eight) hours as needed for moderate pain.   Yes [provider]  aspirin EC 81 MG tablet Take 81 mg by mouth daily. Swallow whole.   Yes [provider]  clopidogrel (PLAVIX) 75 MG tablet Take 37.5 mg by mouth every other day. 04/02/20  Yes [provider]  divalproex (DEPAKOTE ER) 500 MG 24 hr tablet Take  1 tablet (500 mg total) by mouth daily. Patient taking differently: Take 500 mg by mouth at bedtime. 12/19/19  Yes Cameron Sprang, MD  levocetirizine (XYZAL) 5 MG tablet Take 5 mg by mouth as needed.   Yes [provider]  rosuvastatin (CRESTOR) 10 MG tablet Take 1 tablet (10 mg total) by mouth daily. Patient taking differently: Take 10 mg by mouth at bedtime. 04/06/19 04/03/21 Yes Revankar, Reita Cliche, MD  escitalopram (LEXAPRO) 5 MG tablet Take 1 tablet (5 mg total) by mouth daily. Patient not taking: No sig reported 12/19/19   Cameron Sprang, MD     Family History  Problem Relation Age of Onset  . Heart disease Mother   . Stroke Mother   . Heart disease Father   . Atrial fibrillation Sister   . Diabetes Brother   . Diabetes Maternal Grandmother   . Heart attack Maternal Uncle   . Heart attack Maternal Uncle     Social History   Socioeconomic History  . Marital status: Married    Spouse name: Not on file  . Number of children: Not on file  . Years of education: Not on file  . Highest education level: Not on file  Occupational History  . Not on file  Tobacco Use  . Smoking status: Current Every Day Smoker    Packs/day: 0.33    Types: Cigarettes  . Smokeless tobacco: Never Used  Vaping Use  . Vaping Use: Never used  Substance and Sexual Activity  . Alcohol use: Never  . Drug use: Never  . Sexual activity: Not on file  Other Topics Concern  . Not on file  Social History Narrative   One story home mobile home   Right handed   Drinks caffeine on occasion   Social Determinants of Health   Financial Resource Strain: Not on file  Food Insecurity: Not on file  Transportation Needs: Not on file  Physical Activity: Not on file  Stress: Not on file  Social Connections: Not on file    Review of Systems: A 12 point ROS discussed and pertinent positives are indicated in the HPI above.  All other systems are negative.  Review of Systems  Constitutional: Negative for activity change, fatigue and fever.  HENT: Negative for tinnitus and trouble swallowing.   Eyes: Negative for visual disturbance.  Respiratory: Negative for cough and shortness of breath.   Cardiovascular: Negative for chest pain and leg swelling.  Gastrointestinal: Negative for abdominal pain and nausea.  Neurological: Positive for tremors, seizures and headaches. Negative for dizziness, syncope, facial asymmetry, speech difficulty, weakness, light-headedness and numbness.  Psychiatric/Behavioral: Negative for behavioral problems,  confusion and decreased concentration.    Vital Signs: BP (!) 107/58   Pulse 90   Temp 97.6 F (36.4 C) (Temporal)   Resp 17   Ht 5\' 2"  (1.575 m)   Wt 123 lb (55.8 kg)   SpO2 100%   BMI 22.50 kg/m   Physical Exam Vitals reviewed.  HENT:     Mouth/Throat:     Mouth: Mucous membranes are moist.  Eyes:     Extraocular Movements: Extraocular movements intact.  Cardiovascular:     Rate and Rhythm: Normal rate and regular rhythm.     Heart sounds: Normal heart sounds.  Pulmonary:     Effort: Pulmonary effort is normal.     Breath sounds: Normal breath sounds. No wheezing.  Abdominal:     General: Bowel sounds are normal.  Palpations: Abdomen is soft.     Tenderness: There is no abdominal tenderness.  Musculoskeletal:        General: Normal range of motion.     Right lower leg: No edema.     Left lower leg: No edema.  Skin:    General: Skin is warm.  Neurological:     Mental Status: She is alert and oriented to person, place, and time.  Psychiatric:        Behavior: Behavior normal.     Imaging: No results found.  Labs:  CBC: Recent Labs    01/12/20 0705 04/11/20 0639 05/14/20 0625  WBC 6.5 6.2 5.4  HGB 13.5 12.7 11.6*  HCT 42.6 39.8 36.2  PLT 229 248 261    COAGS: Recent Labs    01/12/20 0705 04/11/20 0639 05/14/20 0625  INR 0.9 1.1 1.0  APTT 29 36 35    BMP: Recent Labs    01/12/20 0705 04/11/20 0639 05/14/20 0625  NA 141 139 139  K 3.9 3.4* 3.6  CL 106 102 102  CO2 23 24 25   GLUCOSE 97 109* 113*  BUN 20 26* 11  CALCIUM 9.6 9.4 9.3  CREATININE 0.69 0.69 0.63  GFRNONAA >60 >60 >60  GFRAA >60  --   --     LIVER FUNCTION TESTS: No results for input(s): BILITOT, AST, ALT, ALKPHOS, PROT, ALBUMIN in the last 8760 hours.  TUMOR MARKERS: No results for input(s): AFPTM, CEA, CA199, CHROMGRNA in the last 8760 hours.  Assessment and Plan:  Headaches with Rt eye pain since early 2021 Imaging revealing L and R ICA  aneurysms Scheduled now for cerebral arteriogram with Left Internal Carotid Artery Aneuysm aneurysm embolization. Risks and benefits of cerebral angiogram with intervention were discussed with the patient including, but not limited to bleeding, infection, vascular injury, contrast induced renal failure, stroke or even death.  This interventional procedure involves the use of X-rays and because of the nature of the planned procedure, it is possible that we will have prolonged use of X-ray fluoroscopy.  Potential radiation risks to you include (but are not limited to) the following: - A slightly elevated risk for cancer        several years later in life. This risk is typically less than 0.5% percent. This risk is low in comparison to the normal incidence of human cancer, which is 33% for women and 50% for men according to the [employer]. - Radiation induced injury can include skin redness, resembling a rash, tissue breakdown / ulcers and hair loss (which can be temporary or permanent).   The likelihood of either of these occurring depends on the difficulty of the procedure and whether you are sensitive to radiation due to previous procedures, disease, or genetic conditions.   IF your procedure requires a prolonged use of radiation, you will be notified and given written instructions for further action.  It is your responsibility to monitor the irradiated area for the 2 weeks following the procedure and to notify your physician if you are concerned that you have suffered a radiation induced injury.    All of the patient's questions were answered, patient is agreeable to proceed. Consent signed and in chart. She is aware if procedure is performed-- she will be admitted to Neuro ICU overnight and planned for discharge if stable in am She is agreeable to plan  Thank you for this interesting consult.  I greatly enjoyed meeting Lindsay Erickson and look forward to  participating in  their care.  A copy of this report was sent to the requesting provider on this date.  Electronically Signed: Lavonia Drafts, PA-C 05/14/2020, 7:13 AM   I spent a total of    25 Minutes in face to face in clinical consultation, greater than 50% of which was counseling/coordinating care for Left

## 2020-05-14 NOTE — Anesthesia Postprocedure Evaluation (Signed)
Anesthesia Post Note  Patient: Lindsay Erickson  Procedure(s) Performed: IR WITH ANESTHESIA ANEURYSM EMBOLIZATION (N/A )     Patient location during evaluation: PACU Anesthesia Type: General Level of consciousness: awake and alert, oriented and awake Pain management: pain level controlled Vital Signs Assessment: post-procedure vital signs reviewed and stable Respiratory status: spontaneous breathing, nonlabored ventilation, respiratory function stable and patient connected to nasal cannula oxygen Cardiovascular status: blood pressure returned to baseline and stable Postop Assessment: no apparent nausea or vomiting Anesthetic complications: no   No complications documented.  Last Vitals:  Vitals:   05/14/20 1445 05/14/20 1500  BP: (!) 105/59 (!) 103/51  Pulse: 73 72  Resp: 13 17  Temp:    SpO2: 100% 100%    Last Pain:  Vitals:   05/14/20 1450  TempSrc:   PainSc: [place]

## 2020-05-14 NOTE — Sedation Documentation (Signed)
Patient has pain at site/below site, MD at bedside, pressure resumed being held. MD placing order.

## 2020-05-14 NOTE — Progress Notes (Signed)
ANTICOAGULATION CONSULT NOTE - Initial Consult  Pharmacy Consult for heparin Indication: Post-neuro IR procedure  Allergies  Allergen Reactions  . Gabapentin Other (See Comments)    insomnia  . Primidone Other (See Comments)    Ineffective   . Propranolol Swelling    Can't take beta blockers  . Venlafaxine Other (See Comments)    Irritable/hyper  . Latex Itching  . Nickel   . Codeine Itching    Patient Measurements: Height: 5\' 2"  (157.5 cm) Weight: 55.8 kg (123 lb) IBW/kg (Calculated) : 50.1 Heparin Dosing Weight: 55.8 kg   Vital Signs: Temp: 97.7 F (36.5 C) (01/10 1245) Temp Source: Temporal (01/10 0620) BP: 109/59 (01/10 1415) Pulse Rate: 74 (01/10 1415)  Labs: Recent Labs    05/14/20 0625  HGB 11.6*  HCT 36.2  PLT 261  APTT 35  LABPROT 12.9  INR 1.0  CREATININE 0.63    Estimated Creatinine Clearance: 58.4 mL/min (by C-G formula based on SCr of 0.63 mg/dL).   Medical History: Past Medical History:  Diagnosis Date  . Acid reflux   . Anemia   . Anxiety   . Constipation     05/11/20- improved  . Depression   . Dyslipidemia   . Essential tremor   . History of kidney stones   . Memory loss   . Migraine    cluster headache occasional  . Peripheral vascular disease ([place])    blood clot in leg after being kicked by a horse - age 42  . Pneumonia   . PONV (postoperative nausea and vomiting)    after breast biopsy and headache.  . Seizure disorder ([place])     Medications:  Medications Prior to Admission  Medication Sig Dispense Refill Last Dose  . acetaminophen (TYLENOL) 500 MG tablet Take 500 mg by mouth every 8 (eight) hours as needed for moderate pain.   05/13/2020 at Unknown time  . aspirin EC 81 MG tablet Take 81 mg by mouth daily. Swallow whole.   05/14/2020 at 0445  . clopidogrel (PLAVIX) 75 MG tablet Take 37.5 mg by mouth every other day.   05/13/2020 at Unknown time  . divalproex (DEPAKOTE ER) 500 MG 24 hr tablet Take 1 tablet (500 mg total) by  mouth daily. (Patient taking differently: Take 500 mg by mouth at bedtime.) 90 tablet 3 05/13/2020 at Unknown time  . levocetirizine (XYZAL) 5 MG tablet Take 5 mg by mouth as needed.   Past Week at Unknown time  . rosuvastatin (CRESTOR) 10 MG tablet Take 1 tablet (10 mg total) by mouth daily. (Patient taking differently: Take 10 mg by mouth at bedtime.) 90 tablet 3 05/13/2020 at Unknown time  . escitalopram (LEXAPRO) 5 MG tablet Take 1 tablet (5 mg total) by mouth daily. (Patient not taking: No sig reported) 90 tablet 3 Not Taking at Unknown time    Assessment: 60 YOF s/p coil assisted flow diverter embolization of paraopthalmic aneurysm on IV heparin until tomorrow morning per neuro IR protocol.   Hgb mildly low. Plt wnl  Goal of Therapy:  Heparin level 0.1-0.25 units/ml Monitor platelets by anticoagulation protocol: Yes   Plan:  -Continue IV heparin at 500 units/hr  -F/u 6 hr HL -IV heparin to stop at 8 AM tomorrow per IR protocol   Albertina Parr, PharmD., BCPS, BCCCP Clinical Pharmacist Please refer to [facility] for unit-specific pharmacist

## 2020-05-14 NOTE — Progress Notes (Addendum)
NIR.  History of left ICA paraophthalmic aneurysm embolization this AM using stent assisted coiling via right femoral approach by Dr. Estanislado Pandy.  Patient with pain and swelling of right groin following procedure. On PE, right femoral puncture site with tenderness, swelling, and ecchymosis- swelling was marked. Distal pulses (DPs) palpable bilaterally with Doppler.  VAS [place] ordered per Dr. Estanislado Pandy which revealed small pseudoaneurysm (1.29 x 1.5 cm, 0.55 cm neck per [place]). Hold IV Heparin. Vascular surgery consulted per Dr. Estanislado Pandy.  NIR to follow.   ADDENDUM: Patient re-evaluated following vascular surgery consult. Patient awake and alert laying in bed. Complains of tenderness of right groin, stable.  Alert, awake, and oriented x3. Speech and comprehension intact. PERRL bilaterally. EOMs intact bilaterally without nystagmus or subjective diplopia. Visual fields intact bilaterally. No facial asymmetry. Tongue midline. Can spontaneously move all extremities. No pronator drift. Distal pulses (DPs) palpable bilaterally with Doppler.  Patient to go to OR with vascular surgery for right femoral repair today. NIR to follow.   Bea Graff Sierria Bruney, PA-C 05/14/2020, 3:34 PM

## 2020-05-15 ENCOUNTER — Encounter (HOSPITAL_COMMUNITY): Payer: Self-pay | Admitting: Vascular Surgery

## 2020-05-15 LAB — CBC WITH DIFFERENTIAL/PLATELET
Abs Immature Granulocytes: 0.02 10*3/uL (ref 0.00–0.07)
Basophils Absolute: 0 10*3/uL (ref 0.0–0.1)
Basophils Relative: 0 %
Eosinophils Absolute: 0 10*3/uL (ref 0.0–0.5)
Eosinophils Relative: 0 %
HCT: 18.1 % — ABNORMAL LOW (ref 36.0–46.0)
Hemoglobin: 6.2 g/dL — CL (ref 12.0–15.0)
Immature Granulocytes: 0 %
Lymphocytes Relative: 17 %
Lymphs Abs: 0.8 10*3/uL (ref 0.7–4.0)
MCH: 34.8 pg — ABNORMAL HIGH (ref 26.0–34.0)
MCHC: 34.3 g/dL (ref 30.0–36.0)
MCV: 101.7 fL — ABNORMAL HIGH (ref 80.0–100.0)
Monocytes Absolute: 0.2 10*3/uL (ref 0.1–1.0)
Monocytes Relative: 4 %
Neutro Abs: 3.6 10*3/uL (ref 1.7–7.7)
Neutrophils Relative %: 79 %
Platelets: 171 10*3/uL (ref 150–400)
RBC: 1.78 MIL/uL — ABNORMAL LOW (ref 3.87–5.11)
RDW: 14 % (ref 11.5–15.5)
WBC: 4.6 10*3/uL (ref 4.0–10.5)
nRBC: 0 % (ref 0.0–0.2)

## 2020-05-15 LAB — HEMOGLOBIN AND HEMATOCRIT, BLOOD
HCT: 29.9 % — ABNORMAL LOW (ref 36.0–46.0)
Hemoglobin: 10.1 g/dL — ABNORMAL LOW (ref 12.0–15.0)

## 2020-05-15 LAB — BASIC METABOLIC PANEL
Anion gap: 7 (ref 5–15)
BUN: 11 mg/dL (ref 8–23)
CO2: 19 mmol/L — ABNORMAL LOW (ref 22–32)
Calcium: 7.1 mg/dL — ABNORMAL LOW (ref 8.9–10.3)
Chloride: 111 mmol/L (ref 98–111)
Creatinine, Ser: 0.54 mg/dL (ref 0.44–1.00)
GFR, Estimated: >60 mL/min (ref >60)
Glucose, Bld: 142 mg/dL — ABNORMAL HIGH (ref 70–99)
Potassium: 4 mmol/L (ref 3.5–5.1)
Sodium: 137 mmol/L (ref 135–145)

## 2020-05-15 LAB — PREPARE RBC (CROSSMATCH)

## 2020-05-15 LAB — ABO/RH: ABO/RH(D): A NEG

## 2020-05-15 MED ORDER — SODIUM CHLORIDE 0.9% IV SOLUTION: Freq: Once | INTRAVENOUS | Status: DC

## 2020-05-15 NOTE — Progress Notes (Signed)
Pt hypotensive, SBP 80's, MAP 50's. Contacted Deveshwar. He states, pt's baseline SBP is 100's, as long as patients' neuro status is unchanged he is okay with this pressure if Vascular is okay with it. Order to increase IVF rate to 5ml/hr.   Vascular MD Stanford Breed) contacted, orders for CBC and BMET now, if labs look good and pt is asymptomatic, he is also ok with this pressure.

## 2020-05-15 NOTE — Progress Notes (Addendum)
  Progress Note    05/15/2020 7:46 AM 1 Day Post-Op  Subjective:  Right groin sore   Vitals:   05/15/20 0700 05/15/20 0715  BP: (!) 72/46   Pulse: 73 72  Resp: (!) 21 14  Temp:  97.6 F (36.4 C)  SpO2: 99% 99%   Physical Exam: Cardiac:  regular Lungs: non labored Incisions: right groin with Prevena vac to suction. Minimal output Extremities: lower extremities well perfused and warm with palpable DP pulses. Edematous. Motor and sensation intact Abdomen: Soft, non tender, non distended Neurologic:alert and oriented  CBC    Component Value Date/Time   WBC 4.6 05/15/2020 0219   RBC 1.78 (L) 05/15/2020 0219   HGB 6.2 (LL) 05/15/2020 0219   HCT 18.1 (L) 05/15/2020 0219   PLT 171 05/15/2020 0219   MCV 101.7 (H) 05/15/2020 0219   MCH 34.8 (H) 05/15/2020 0219   MCHC 34.3 05/15/2020 0219   RDW 14.0 05/15/2020 0219   LYMPHSABS 0.8 05/15/2020 0219   MONOABS 0.2 05/15/2020 0219   EOSABS 0.0 05/15/2020 0219   BASOSABS 0.0 05/15/2020 0219    BMET    Component Value Date/Time   NA 137 05/15/2020 0219   NA 139 01/06/2019 1022   K 4.0 05/15/2020 0219   CL 111 05/15/2020 0219   CO2 19 (L) 05/15/2020 0219   GLUCOSE 142 (H) 05/15/2020 0219   BUN 11 05/15/2020 0219   BUN 29 (H) 01/06/2019 1022   CREATININE 0.54 05/15/2020 0219   CALCIUM 7.1 (L) 05/15/2020 0219   GFRNONAA >60 05/15/2020 0219   GFRAA >60 01/12/2020 0705    INR    Component Value Date/Time   INR 1.0 05/14/2020 0625     Intake/Output Summary (Last 24 hours) at 05/15/2020 0746 Last data filed at 05/15/2020 0600 Gross per 24 hour  Intake 2357.62 ml  Output 1465 ml  Net 892.62 ml     Assessment/Plan:  60 y.o. female is s/p repair of right common femoral pseudoaneurysm 1 Day Post-Op. RLE well perfused and warm with palpable DP pulse. BLE edematous. Prevena vac R groin with good suction. minimal output. Hypotensive. Hgb 6.2. Transfusing 1 of 2 units of PRBC. Will recheck labs post transfusion. Presently  asymptomatic  DVT prophylaxis:    Marval Regal [facility] [phone] 05/15/2020 7:46 AM   VASCULAR STAFF ADDENDUM: I have independently interviewed and examined the patient. I agree with the above.  Looks good after open repair of pseudoaneurysm. Hgb 6.2 this AM. Likely mix of injury and operative loss. Transfuse 2u PRBC this AM. Otherwise looks great.  Yevonne Aline. Stanford Breed, MD [facility] Phone Number: [phone] 05/15/2020 7:53 AM

## 2020-05-15 NOTE — Progress Notes (Signed)
NIR.  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 Stanford Breed.  Patient awake and alert laying in bed watching TV. Husband at bedside. Complains of swelling of bilateral hands- improved since this AM. Less pale than this AM s/p blood transfusions. Denies headache, vision changes, dizziness, N/V.  Alert, awake, and oriented x3. Speech and comprehension intact. PERRL bilaterally. Can spontaneously move all extremities. No pronator drift. Distal pulses (DPs) 1+ bilaterally. Tremors unchanged from prior to procedure. Right femoral puncture site soft with woundvac in place, no active bleeding or hematoma noted.  Plan to stay additional night in neuro ICU. BP via art line. Further management of right groin per vascular surgery- appreciate and agree with management. NIR to follow.   Bea Graff Tyller Bowlby, PA-C 05/15/2020, 3:30 PM

## 2020-05-15 NOTE — Progress Notes (Signed)
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 Stanford Breed. Patient awake and alert sitting in bed. Complains of tenderness of right groin, as expected. Woundvac in place. Can spontaneously move all extremities. Neurologically intact.   Allergies: Gabapentin, Primidone, Propranolol, Venlafaxine, Latex, Nickel, and Codeine  Medications: Prior to Admission medications   Medication Sig Start Date End Date Taking? Authorizing Provider  acetaminophen (TYLENOL) 500 MG tablet Take 500 mg by mouth every 8 (eight) hours as needed for moderate pain.   Yes [provider]  aspirin EC 81 MG tablet Take 81 mg by mouth daily. Swallow whole.   Yes [provider]  clopidogrel (PLAVIX) 75 MG tablet Take 37.5 mg by mouth every other day. 04/02/20  Yes [provider]  divalproex (DEPAKOTE ER) 500 MG 24 hr tablet Take 1 tablet (500 mg total) by mouth daily. Patient taking differently: Take 500 mg by mouth at bedtime. 12/19/19  Yes Cameron Sprang, MD  levocetirizine (XYZAL) 5 MG tablet Take 5 mg by mouth as needed.   Yes [provider]  rosuvastatin (CRESTOR) 10 MG tablet Take 1 tablet (10 mg total) by mouth daily. Patient taking differently: Take 10 mg by mouth at bedtime. 04/06/19 04/03/21 Yes Revankar, Reita Cliche, MD  escitalopram (LEXAPRO) 5 MG tablet Take 1 tablet (5 mg total) by mouth daily. Patient not taking: No sig reported 12/19/19   Cameron Sprang, MD     Vital Signs: BP (!) 72/46   Pulse 70   Temp 98.2 F (36.8 C) (Oral)   Resp 17   Ht 5\' 2"  (1.575 m)   Wt 123 lb (55.8 kg)   SpO2 99%   BMI 22.50  kg/m   Physical Exam Vitals and nursing note reviewed.  Constitutional:      General: She is not in acute distress. Pulmonary:     Effort: Pulmonary effort is normal. No respiratory distress.  Skin:    General: Skin is warm and dry.     Comments: Right femoral puncture site soft with woundvac in place, no active bleeding or hematoma noted.  Neurological:     Mental Status: She is alert.     Comments: Alert, awake, and oriented x3. Speech and comprehension intact. PERRL bilaterally. EOMs intact bilaterally without nystagmus or subjective diplopia. No facial asymmetry. Tongue midline. Can spontaneously move all extremities. No pronator drift. Fine motor and coordination intact and symmetric. Distal pulses (DPs) 1+ bilaterally.     Imaging: VAS [place] GROIN PSEUDOANEURYSM  Result Date: 05/14/2020  ARTERIAL PSEUDOANEURYSM  Exam: Right groin Indications: Patient complains of groin pain, bruising and palpable knot. History: S/p catheterization. Comparison Study: No prior study on file Performing Technologist: Sharion Dove RVS  Examination Guidelines: A complete evaluation includes B-mode imaging, spectral Doppler, color Doppler, and power Doppler as needed of all accessible portions of each vessel. Bilateral testing is considered an integral part of a complete examination. Limited examinations for reoccurring indications may be performed as noted.  Findings: An area with well defined borders measuring 1.3 cm x 1.5 cm was visualized with ultrasound characteristics of a partially thrombosed pseudoaneurysm. Neck width 0.55cm  Diagnosing physician: Jamelle Haring Electronically signed by Jamelle Haring on 05/14/2020 at  7:19:23 PM.   --------------------------------------------------------------------------------    Final     Labs:  CBC: Recent Labs    01/12/20 0705 04/11/20 0639 05/14/20 0625 05/15/20 0219  WBC 6.5 6.2 5.4 4.6  HGB 13.5 12.7 11.6* 6.2*  HCT 42.6 39.8 36.2 18.1*  PLT 229  248 261 171    COAGS: Recent Labs    01/12/20 0705 04/11/20 0639 05/14/20 0625  INR 0.9 1.1 1.0  APTT 29 36 35    BMP: Recent Labs    01/12/20 0705 04/11/20 0639 05/14/20 0625 05/15/20 0219  NA 141 139 139 137  K 3.9 3.4* 3.6 4.0  CL 106 102 102 111  CO2 23 24 25  19*  GLUCOSE 97 109* 113* 142*  BUN 20 26* 11 11  CALCIUM 9.6 9.4 9.3 7.1*  CREATININE 0.69 0.69 0.63 0.54  GFRNONAA >60 >60 >60 >60  GFRAA >60  --   --   --      Assessment and Plan:  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 Stanford Breed. Patient remains neurologically intact. Right femoral puncture site stable with woundvac in place, distal pulses (DPs) 1+ bilaterally- further management of right groin per vascular surgery. Hgb 6.2 this AM- currently undergoing transfusion. Advance diet as tolerated. NIR to follow.   Electronically Signed: Earley Abide, PA-C 05/15/2020, 9:26 AM   I spent a total of 25 Minutes at the the patient's bedside AND on the patient's [facility] floor or unit, greater than 50% of which was counseling/coordinating care for left ICA paraophthalmic aneurysm s/p embolization.

## 2020-05-16 LAB — TYPE AND SCREEN
ABO/RH(D): A NEG
Antibody Screen: NEGATIVE
Unit division: 0
Unit division: 0

## 2020-05-16 LAB — BPAM RBC
Blood Product Expiration Date: 202202032359
Blood Product Expiration Date: 202202032359
ISSUE DATE / TIME: 202201110647
ISSUE DATE / TIME: 202201110907
Unit Type and Rh: 600
Unit Type and Rh: 600

## 2020-05-16 NOTE — Evaluation (Signed)
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  Pt still unsteady post sx and due to chronic tremors needing light min assist..  Pt currently limited functionally due to the problems listed below.  (see problems list.)  Pt will benefit from PT to maximize function and safety to be able to get home safely with available assist.     Follow Up Recommendations Home health PT;Supervision/Assistance - 24 hour (initially)    Equipment Recommendations  Rolling walker with 5" wheels    Recommendations for Other Services       Precautions / Restrictions Precautions Precautions: Fall      Mobility  Bed Mobility Overal bed mobility: Needs Assistance Bed Mobility: Supine to Sit     Supine to sit: Min assist     General bed mobility comments: truncal assist and stability while pt scooting to EOB    Transfers Overall transfer level: Needs assistance Equipment used: Rolling walker (2 wheeled);None Transfers: Sit to/from Stand Sit to Stand: Min assist         General transfer comment: cues for hand placement/safety, stability assist  Ambulation/Gait Ambulation/Gait assistance: Min guard;Min assist Gait Distance (Feet): 10 Feet (x2, to/from toilet.) Assistive device: Rolling walker (2 wheeled) Gait Pattern/deviations: Step-to pattern;Decreased step length - right;Decreased step length - left;Decreased stride length   Gait velocity interpretation: <1.31 ft/sec, indicative of household  ambulator General Gait Details: tremor with mild unsteadiness.  Pt maneuvered RW in confined space of the bathroom without difficulty and AD added to safety and stability.  Stairs            Wheelchair Mobility    Modified Rankin (Stroke Patients Only)       Balance Overall balance assessment: Needs assistance Sitting-balance support: Single extremity supported;No upper extremity supported Sitting balance-Leahy Scale: Fair Sitting balance - Comments: able to sit without use of UE's   Standing balance support: During functional activity;Bilateral upper extremity supported Standing balance-Leahy Scale: Poor Standing balance comment: reliant on some type of AD or external support at time of evaluation or balance.                             Pertinent Vitals/Pain Pain Assessment: Faces Faces Pain Scale: Hurts whole lot Pain Location: R groin Pain Descriptors / Indicators: Discomfort;Grimacing Pain Intervention(s): Limited activity within patient's tolerance;Monitored during session    Home Living Family/patient expects to be discharged to:: Private residence Living Arrangements: Spouse/significant other Available Help at Discharge: Family Type of Home: Mobile home Home Access: Stairs to enter   Technical brewer of Steps: 3 Home Layout: One level Home Equipment: Hand held shower head;None      Prior Function Level of Independence: Independent;Needs assistance   Gait / Transfers Assistance Needed: unsteady at baseilne due to tremors  ADL's / Homemaking Assistance Needed: husband assists as needed with ADL  Comments: husband works 8 hrs/day; family friends check on     Hand Dominance   Dominant Hand: Right    Extremity/Trunk Assessment   Upper Extremity Assessment Upper Extremity Assessment:  Defer to OT evaluation    Lower Extremity Assessment Lower Extremity Assessment: RLE deficits/detail;LLE deficits/detail RLE Deficits / Details:  groin pain limiting hip flexion and movement without assist,  moderate tremor. RLE: Unable to fully assess due to pain RLE Coordination: decreased fine motor LLE Deficits / Details: general weakness, but functional, moderate tremor       Communication   Communication: Other (comment) (essentail tremor affects voice)  Cognition Arousal/Alertness: Awake/alert Behavior During Therapy: WFL for tasks assessed/performed Overall Cognitive Status: Within Functional Limits for tasks assessed                                        General Comments General comments (skin integrity, edema, etc.): HR spiking into the 120's 130's for brief periods.    Exercises     Assessment/Plan    PT Assessment Patient needs continued PT services  PT Problem List Decreased strength;Decreased activity tolerance;Decreased balance;Decreased mobility;Decreased knowledge of use of DME;Pain       PT Treatment Interventions DME instruction;Gait training;Stair training;Functional mobility training;Therapeutic activities;Patient/family education    PT Goals (Current goals can be found in the Care Plan section)  Acute Rehab PT Goals Patient Stated Goal: get home able to do for myself with husband at work. PT Goal Formulation: With patient Time For Goal Achievement: 05/30/20 Potential to Achieve Goals: Good    Frequency Min 3X/week   Barriers to discharge Decreased caregiver support      Co-evaluation PT/OT/SLP Co-Evaluation/Treatment: Yes Reason for Co-Treatment: For patient/therapist safety PT goals addressed during session: Mobility/safety with mobility         AM-PAC PT "6 Clicks" Mobility  Outcome Measure Help needed turning from your back to your side while in a flat bed without using bedrails?: A Little Help needed moving from lying on your back to sitting on the side of a flat bed without using bedrails?: A Little Help needed moving to and from a bed to a chair (including a  wheelchair)?: A Little Help needed standing up from a chair using your arms (e.g., wheelchair or bedside chair)?: A Little Help needed to walk in hospital room?: A Little Help needed climbing 3-5 steps with a railing? : A Little 6 Click Score: 18    End of Session   Activity Tolerance: Patient tolerated treatment well;Patient limited by pain Patient left: in chair;with call bell/phone within reach;with chair alarm set;with family/visitor present Nurse Communication: Mobility status PT Visit Diagnosis: Difficulty in walking, not elsewhere classified (R26.2);Pain Pain - Right/Left: Right Pain - part of body:  (groin)    Time: 9323-5573 PT Time Calculation (min) (ACUTE ONLY): 26 min   Charges:   PT Evaluation $PT Eval Moderate Complexity: 1 Mod          05/16/2020  Ginger Carne., PT Acute Rehabilitation Services [phone]  (pager) [phone]  (office)  Tessie Fass Christien Berthelot 05/16/2020, 1:51 PM

## 2020-05-16 NOTE — Progress Notes (Signed)
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 Lenell AntuHawken. Patient awake and alert sitting in bed. Husband at bedside. Still with tenderness of right groin. Woundvac in place. Can spontaneously move all extremities. Neurologically intact.   Allergies: Gabapentin, Primidone, Propranolol, Venlafaxine, Latex, Nickel, and Codeine  Medications: Prior to Admission medications   Medication Sig Start Date End Date Taking? Authorizing Provider  acetaminophen (TYLENOL) 500 MG tablet Take 500 mg by mouth every 8 (eight) hours as needed for moderate pain.   Yes [provider]  aspirin EC 81 MG tablet Take 81 mg by mouth daily. Swallow whole.   Yes [provider]  clopidogrel (PLAVIX) 75 MG tablet Take 37.5 mg by mouth every other day. 04/02/20  Yes [provider]  divalproex (DEPAKOTE ER) 500 MG 24 hr tablet Take 1 tablet (500 mg total) by mouth daily. Patient taking differently: Take 500 mg by mouth at bedtime. 12/19/19  Yes Van ClinesAquino, Karen M, MD  levocetirizine (XYZAL) 5 MG tablet Take 5 mg by mouth as needed.   Yes [provider]  rosuvastatin (CRESTOR) 10 MG tablet Take 1 tablet (10 mg total) by mouth daily. Patient taking differently: Take 10 mg by mouth at bedtime. 04/06/19 04/03/21 Yes Revankar, Aundra Dubinajan R, MD  escitalopram (LEXAPRO) 5 MG tablet Take 1 tablet (5 mg total) by mouth daily. Patient not taking: No sig reported 12/19/19   Van ClinesAquino, Karen M, MD     Vital Signs: BP 98/67   Pulse 79   Temp 98.2 F (36.8 C) (Oral)   Resp 16   Ht 5\' 2"  (1.575 m)   Wt 123 lb (55.8 kg)   SpO2 99%   BMI  22.50 kg/m   Physical Exam Constitutional:      General: She is not in acute distress. Pulmonary:     Effort: Pulmonary effort is normal. No respiratory distress.  Skin:    General: Skin is warm and dry.     Comments: Right femoral puncture site soft with woundvac in place, no active bleeding or hematoma noted.   Neurological:     Mental Status: She is alert.     Comments: Alert, awake, and oriented x3. Speech and comprehension intact. PERRL bilaterally. Can spontaneously move all extremities. No pronator drift. Fine motor and coordination intact and symmetric. Distal pulses (DPs) 1+ bilaterally.  Tremors unchanged from prior to procedure.     Imaging: IR Transcath/Emboliz  Result Date: 05/16/2020 CLINICAL DATA:  Patient with headaches and multiple intracranial aneurysms. EXAM: TRANSCATHETER THERAPY EMBOLIZATION COMPARISON:  Diagnostic catheter arteriogram of January 12, 2020. MEDICATIONS: Heparin 3,000 units IV. Ancef 2 g IV antibiotic was administered within 1 hour of the procedure. ANESTHESIA/SEDATION: General anesthesia CONTRAST:  Isovue 300 approximately 70 mL FLUOROSCOPY TIME:  Fluoroscopy Time: 24 minutes 36 seconds (1426 mGy). COMPLICATIONS: See below TECHNIQUE: Informed written consent was obtained from the patient after a thorough discussion of the procedural risks, benefits and alternatives. All questions were addressed. Maximal Sterile Barrier Technique was utilized including caps, mask, sterile gowns, sterile gloves, sterile drape, hand hygiene and skin antiseptic. A timeout was performed prior to the initiation of the procedure. Initially a right radial approach was attempted. The radial artery was  interrogated with ultrasound and its morphology documented. A dorsal palmar anastomosis was verified to be present. However, this approach was abandoned due to vessel spasm induced by the micro guidewire. The right groin was prepped and draped in the usual sterile fashion.  Thereafter using modified Seldinger technique, transfemoral access into the right common femoral artery was obtained without difficulty. Over a 0.035 inch guidewire, an 8 [place] Pinnacle sheath was inserted. Through this, and also over 0.035 inch guidewire, a combination of a 7 Rist 95 cm sheath with a 5.5 [place] Simmons 2 support catheter was advanced to the aortic arch region and selectively positioned in the left common carotid artery. The 7 French sheath was advanced over the combination of the guidewire, and also the support diagnostic catheter to just proximal to the left common carotid bifurcation. The guidewire, and support catheter were removed. Arteriogram was then performed centered extra cranially and intracranially. FINDINGS: The left common carotid arteriogram again demonstrated the left external carotid artery and its major branches to be widely patent. The left internal carotid artery at the bulb to the cranial skull base demonstrates wide patency. Patency was maintained of the petrous, cavernous and supraclinoid segments. The left middle cerebral artery and the left anterior cerebral artery opacified normally into the capillary and venous phases. Again demonstrated was the presence of an approximately 7.8 mm x 4.5 mm saccular aneurysm arising in the left paraophthalmic region projecting superiorly and slightly posteriorly and medially. ENDOVASCULAR TREATMENT OF THE WIDE NECK OF LEFT PARAOPHTHALMIC REGION ANEURYSM WITH COILING AND PLACEMENT OF PIPELINE FLOW DIVERTER DEVICE Over a 0.035 inch Roadrunner guidewire, the 7 [place] Rist sheath was advanced to the distal horizontal petrous segment. The guidewire was removed. Good aspiration obtained from the hub of the 7 French sheath. A movement was then obtained injecting through the sheath. Measurements were then performed of the distal and the proximal landing zones of the flow diverter in the supraclinoid segment, and also the cavernous segment. Over a  0.014 inch standard Synchro micro guidewire, an 027 150 cm Phenom microcatheter was advanced through the sheath to the cavernous segment. Using a torque device, access was obtained with the micro guidewire across the aneurysm and into the left middle cerebral artery M2 M3 region followed by the microcatheter. The guidewire was removed. Good aspiration obtained from the hub of the distal end of the microcatheter which was in the dominant inferior division of the left middle cerebral artery M2 M3 region. A gentle control arteriogram performed through the microcatheter demonstrated safe position of the tip of the microcatheter which was then connected to continuous heparinized saline infusion. In a coaxial manner and with constant heparinized saline infusion, over a 0.014 inch standard Synchro micro guidewire, an Echelon 10 microcatheter was then advanced to the proximal cavernous segment of the left internal carotid artery. Using a torque device, access was obtained into the aneurysm with the micro guidewire followed by the advancement of the microcatheter into the aneurysm. The micro guidewire was gently retrieved and removed. Good aspiration was obtained from the intra aneurysmal microcatheter. This was then connected to continuous heparinized saline infusion. Initially, a 5 mm x 10 cm 360 Soft Target coil was advanced in a coaxial manner and with constant heparinized saline infusion to the distal end of the intra aneurysmal microcatheter. The coil was then advanced to form a framing configuration. A control arteriogram performed through the 7 French sheath demonstrates safe position of the coil. This was then detached without difficulty. This was  then followed by the advancement of a 4 mm x 8 cm 360 Soft Target coil as described above. Again using biplane roadmap technique, the coil was advanced into the aneurysm with a nice stable configuration. This coil was then detached without difficulty. A control arteriogram  performed through the 7 French sheath demonstrated stagnation of contrast within the aneurysm. A 4.5 mm x 14 mm pipeline shield flow diverter was then prepped and purged with heparinized saline infusion. This was then advanced to the distal end of the 027 microcatheter. The O ring on the delivery microcatheter was loosened. With slight forward gentle traction with the right hand on the delivery micro guidewire, the microcatheter was gently retrieved unsheathing the distal wire, and a few mm of the distal device. Combination was then retrieved into the more proximal left middle cerebral M1 segment where the device was seen to open distally. Once fully opened, the combination was retrieved to the distal landing zone right at the level of the left posterior communicating artery. The intra aneurysmal microcatheter was removed without any change in the coil configuration. Using advancement of the micro guidewire, and the forward motion of the microcatheter in order to ensure close apposition to the parent artery the remainder of the device was deployed. A control arteriogram performed through the 7 French sheath demonstrated excellent apposition of the device distally and proximally without evidence of endoleak. Under constant fluoroscopic guidance, the combination of the micro guidewire, and the microcatheter were retrieved and removed. Control arteriograms were then performed at 10 and 25 minutes post deployment of the flow diverter. These continued to demonstrate no evidence of intra stent or of intracranial filling defects or of occlusions. During the procedure, the patient's blood pressure and neurological status remained stable. The 7 [place] Rist guide sheath was removed. The 8 French sheath was then replaced with a 6 [place] sheath. This in turn was removed with pressure being held in the right groin for approximately 30 minutes. Distal pulses remained Dopplerable in the posterior tibial and the dorsalis pedis  regions. A hematoma was noted. The patient was given a total of approximately 27.5 mg of protamine sulfate in view of her ACT being in the region of approximately 300. The patient was then extubated. Upon recovery, the patient denied any headaches, nausea, vomiting, of visual symptoms or motor weakness. A right groin hematoma was noted during this time and compressed with manual compression over 30 minutes. Distal pulses remained Dopplerable. Due to the painful nature of the hematoma and associated skin discoloration, an ultrasound of the right groin was obtained promptly in the PACU. This demonstrated a small pseudoaneurysm. Vascular surgery consultation was initiated in view of the skin discoloration, and painful nature of the pseudoaneurysm. The patient underwent intra operative repair of the pseudoaneurysm by vascular surgery. 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 PLAN: Follow-up in the clinic 2 weeks post discharge. Electronically Signed   By: [name] M.D.   On: 05/15/2020 17:07   IR Angiogram Follow Up Study  Result Date: 05/16/2020 CLINICAL DATA:  Patient with headaches and multiple intracranial aneurysms. EXAM: TRANSCATHETER THERAPY EMBOLIZATION COMPARISON:  Diagnostic catheter arteriogram of January 12, 2020. MEDICATIONS: Heparin 3,000 units IV. Ancef 2 g IV antibiotic was administered within 1 hour of the procedure. ANESTHESIA/SEDATION: General anesthesia CONTRAST:  Isovue 300 approximately 70 mL FLUOROSCOPY TIME:  Fluoroscopy Time: 24 minutes 36 seconds (1426 mGy). COMPLICATIONS:  See below TECHNIQUE: Informed written consent was obtained from the patient after a thorough discussion of the procedural risks, benefits and alternatives. All questions were addressed. Maximal Sterile Barrier Technique was utilized including caps, mask, sterile gowns, sterile  gloves, sterile drape, hand hygiene and skin antiseptic. A timeout was performed prior to the initiation of the procedure. Initially a right radial approach was attempted. The radial artery was interrogated with ultrasound and its morphology documented. A dorsal palmar anastomosis was verified to be present. However, this approach was abandoned due to vessel spasm induced by the micro guidewire. The right groin was prepped and draped in the usual sterile fashion. Thereafter using modified Seldinger technique, transfemoral access into the right common femoral artery was obtained without difficulty. Over a 0.035 inch guidewire, an 8 [place] Pinnacle sheath was inserted. Through this, and also over 0.035 inch guidewire, a combination of a 7 Rist 95 cm sheath with a 5.5 [place] Simmons 2 support catheter was advanced to the aortic arch region and selectively positioned in the left common carotid artery. The 7 French sheath was advanced over the combination of the guidewire, and also the support diagnostic catheter to just proximal to the left common carotid bifurcation. The guidewire, and support catheter were removed. Arteriogram was then performed centered extra cranially and intracranially. FINDINGS: The left common carotid arteriogram again demonstrated the left external carotid artery and its major branches to be widely patent. The left internal carotid artery at the bulb to the cranial skull base demonstrates wide patency. Patency was maintained of the petrous, cavernous and supraclinoid segments. The left middle cerebral artery and the left anterior cerebral artery opacified normally into the capillary and venous phases. Again demonstrated was the presence of an approximately 7.8 mm x 4.5 mm saccular aneurysm arising in the left paraophthalmic region projecting superiorly and slightly posteriorly and medially. ENDOVASCULAR TREATMENT OF THE WIDE NECK OF LEFT PARAOPHTHALMIC REGION ANEURYSM WITH COILING AND PLACEMENT  OF PIPELINE FLOW DIVERTER DEVICE Over a 0.035 inch Roadrunner guidewire, the 7 [place] Rist sheath was advanced to the distal horizontal petrous segment. The guidewire was removed. Good aspiration obtained from the hub of the 7 French sheath. A movement was then obtained injecting through the sheath. Measurements were then performed of the distal and the proximal landing zones of the flow diverter in the supraclinoid segment, and also the cavernous segment. Over a 0.014 inch standard Synchro micro guidewire, an 027 150 cm Phenom microcatheter was advanced through the sheath to the cavernous segment. Using a torque device, access was obtained with the micro guidewire across the aneurysm and into the left middle cerebral artery M2 M3 region followed by the microcatheter. The guidewire was removed. Good aspiration obtained from the hub of the distal end of the microcatheter which was in the dominant inferior division of the left middle cerebral artery M2 M3 region. A gentle control arteriogram performed through the microcatheter demonstrated safe position of the tip of the microcatheter which was then connected to continuous heparinized saline infusion. In a coaxial manner and with constant heparinized saline infusion, over a 0.014 inch standard Synchro micro guidewire, an Echelon 10 microcatheter was then advanced to the proximal cavernous segment of the left internal carotid artery. Using a torque device, access was obtained into the aneurysm with the micro guidewire followed by the advancement of the microcatheter into the aneurysm. The micro guidewire was gently retrieved and removed. Good aspiration was obtained from the intra aneurysmal microcatheter. This was then connected to continuous  heparinized saline infusion. Initially, a 5 mm x 10 cm 360 Soft Target coil was advanced in a coaxial manner and with constant heparinized saline infusion to the distal end of the intra aneurysmal microcatheter. The coil was then  advanced to form a framing configuration. A control arteriogram performed through the 7 French sheath demonstrates safe position of the coil. This was then detached without difficulty. This was then followed by the advancement of a 4 mm x 8 cm 360 Soft Target coil as described above. Again using biplane roadmap technique, the coil was advanced into the aneurysm with a nice stable configuration. This coil was then detached without difficulty. A control arteriogram performed through the 7 French sheath demonstrated stagnation of contrast within the aneurysm. A 4.5 mm x 14 mm pipeline shield flow diverter was then prepped and purged with heparinized saline infusion. This was then advanced to the distal end of the 027 microcatheter. The O ring on the delivery microcatheter was loosened. With slight forward gentle traction with the right hand on the delivery micro guidewire, the microcatheter was gently retrieved unsheathing the distal wire, and a few mm of the distal device. Combination was then retrieved into the more proximal left middle cerebral M1 segment where the device was seen to open distally. Once fully opened, the combination was retrieved to the distal landing zone right at the level of the left posterior communicating artery. The intra aneurysmal microcatheter was removed without any change in the coil configuration. Using advancement of the micro guidewire, and the forward motion of the microcatheter in order to ensure close apposition to the parent artery the remainder of the device was deployed. A control arteriogram performed through the 7 French sheath demonstrated excellent apposition of the device distally and proximally without evidence of endoleak. Under constant fluoroscopic guidance, the combination of the micro guidewire, and the microcatheter were retrieved and removed. Control arteriograms were then performed at 10 and 25 minutes post deployment of the flow diverter. These continued to  demonstrate no evidence of intra stent or of intracranial filling defects or of occlusions. During the procedure, the patient's blood pressure and neurological status remained stable. The 7 [place] Rist guide sheath was removed. The 8 French sheath was then replaced with a 6 [place] sheath. This in turn was removed with pressure being held in the right groin for approximately 30 minutes. Distal pulses remained Dopplerable in the posterior tibial and the dorsalis pedis regions. A hematoma was noted. The patient was given a total of approximately 27.5 mg of protamine sulfate in view of her ACT being in the region of approximately 300. The patient was then extubated. Upon recovery, the patient denied any headaches, nausea, vomiting, of visual symptoms or motor weakness. A right groin hematoma was noted during this time and compressed with manual compression over 30 minutes. Distal pulses remained Dopplerable. Due to the painful nature of the hematoma and associated skin discoloration, an ultrasound of the right groin was obtained promptly in the PACU. This demonstrated a small pseudoaneurysm. Vascular surgery consultation was initiated in view of the skin discoloration, and painful nature of the pseudoaneurysm. The patient underwent intra operative repair of the pseudoaneurysm by vascular surgery. 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 PLAN: Follow-up in the clinic 2 weeks post discharge. Electronically Signed   By: [name] M.D.   On: 05/15/2020 17:07   IR Angiogram Follow Up  Study  Result Date: 05/16/2020 CLINICAL DATA:  Patient with headaches and multiple intracranial aneurysms. EXAM: TRANSCATHETER THERAPY EMBOLIZATION COMPARISON:  Diagnostic catheter arteriogram of January 12, 2020. MEDICATIONS: Heparin 3,000 units IV. Ancef 2 g IV antibiotic was administered within  1 hour of the procedure. ANESTHESIA/SEDATION: General anesthesia CONTRAST:  Isovue 300 approximately 70 mL FLUOROSCOPY TIME:  Fluoroscopy Time: 24 minutes 36 seconds (1426 mGy). COMPLICATIONS: See below TECHNIQUE: Informed written consent was obtained from the patient after a thorough discussion of the procedural risks, benefits and alternatives. All questions were addressed. Maximal Sterile Barrier Technique was utilized including caps, mask, sterile gowns, sterile gloves, sterile drape, hand hygiene and skin antiseptic. A timeout was performed prior to the initiation of the procedure. Initially a right radial approach was attempted. The radial artery was interrogated with ultrasound and its morphology documented. A dorsal palmar anastomosis was verified to be present. However, this approach was abandoned due to vessel spasm induced by the micro guidewire. The right groin was prepped and draped in the usual sterile fashion. Thereafter using modified Seldinger technique, transfemoral access into the right common femoral artery was obtained without difficulty. Over a 0.035 inch guidewire, an 8 [place] Pinnacle sheath was inserted. Through this, and also over 0.035 inch guidewire, a combination of a 7 Rist 95 cm sheath with a 5.5 [place] Simmons 2 support catheter was advanced to the aortic arch region and selectively positioned in the left common carotid artery. The 7 French sheath was advanced over the combination of the guidewire, and also the support diagnostic catheter to just proximal to the left common carotid bifurcation. The guidewire, and support catheter were removed. Arteriogram was then performed centered extra cranially and intracranially. FINDINGS: The left common carotid arteriogram again demonstrated the left external carotid artery and its major branches to be widely patent. The left internal carotid artery at the bulb to the cranial skull base demonstrates wide patency. Patency was maintained of the  petrous, cavernous and supraclinoid segments. The left middle cerebral artery and the left anterior cerebral artery opacified normally into the capillary and venous phases. Again demonstrated was the presence of an approximately 7.8 mm x 4.5 mm saccular aneurysm arising in the left paraophthalmic region projecting superiorly and slightly posteriorly and medially. ENDOVASCULAR TREATMENT OF THE WIDE NECK OF LEFT PARAOPHTHALMIC REGION ANEURYSM WITH COILING AND PLACEMENT OF PIPELINE FLOW DIVERTER DEVICE Over a 0.035 inch Roadrunner guidewire, the 7 [place] Rist sheath was advanced to the distal horizontal petrous segment. The guidewire was removed. Good aspiration obtained from the hub of the 7 French sheath. A movement was then obtained injecting through the sheath. Measurements were then performed of the distal and the proximal landing zones of the flow diverter in the supraclinoid segment, and also the cavernous segment. Over a 0.014 inch standard Synchro micro guidewire, an 027 150 cm Phenom microcatheter was advanced through the sheath to the cavernous segment. Using a torque device, access was obtained with the micro guidewire across the aneurysm and into the left middle cerebral artery M2 M3 region followed by the microcatheter. The guidewire was removed. Good aspiration obtained from the hub of the distal end of the microcatheter which was in the dominant inferior division of the left middle cerebral artery M2 M3 region. A gentle control arteriogram performed through the microcatheter demonstrated safe position of the tip of the microcatheter which was then connected to continuous heparinized saline infusion. In a coaxial manner and with constant heparinized saline infusion, over a 0.014 inch standard  Synchro micro guidewire, an Echelon 10 microcatheter was then advanced to the proximal cavernous segment of the left internal carotid artery. Using a torque device, access was obtained into the aneurysm with the  micro guidewire followed by the advancement of the microcatheter into the aneurysm. The micro guidewire was gently retrieved and removed. Good aspiration was obtained from the intra aneurysmal microcatheter. This was then connected to continuous heparinized saline infusion. Initially, a 5 mm x 10 cm 360 Soft Target coil was advanced in a coaxial manner and with constant heparinized saline infusion to the distal end of the intra aneurysmal microcatheter. The coil was then advanced to form a framing configuration. A control arteriogram performed through the 7 French sheath demonstrates safe position of the coil. This was then detached without difficulty. This was then followed by the advancement of a 4 mm x 8 cm 360 Soft Target coil as described above. Again using biplane roadmap technique, the coil was advanced into the aneurysm with a nice stable configuration. This coil was then detached without difficulty. A control arteriogram performed through the 7 French sheath demonstrated stagnation of contrast within the aneurysm. A 4.5 mm x 14 mm pipeline shield flow diverter was then prepped and purged with heparinized saline infusion. This was then advanced to the distal end of the 027 microcatheter. The O ring on the delivery microcatheter was loosened. With slight forward gentle traction with the right hand on the delivery micro guidewire, the microcatheter was gently retrieved unsheathing the distal wire, and a few mm of the distal device. Combination was then retrieved into the more proximal left middle cerebral M1 segment where the device was seen to open distally. Once fully opened, the combination was retrieved to the distal landing zone right at the level of the left posterior communicating artery. The intra aneurysmal microcatheter was removed without any change in the coil configuration. Using advancement of the micro guidewire, and the forward motion of the microcatheter in order to ensure close apposition to  the parent artery the remainder of the device was deployed. A control arteriogram performed through the 7 French sheath demonstrated excellent apposition of the device distally and proximally without evidence of endoleak. Under constant fluoroscopic guidance, the combination of the micro guidewire, and the microcatheter were retrieved and removed. Control arteriograms were then performed at 10 and 25 minutes post deployment of the flow diverter. These continued to demonstrate no evidence of intra stent or of intracranial filling defects or of occlusions. During the procedure, the patient's blood pressure and neurological status remained stable. The 7 [place] Rist guide sheath was removed. The 8 French sheath was then replaced with a 6 [place] sheath. This in turn was removed with pressure being held in the right groin for approximately 30 minutes. Distal pulses remained Dopplerable in the posterior tibial and the dorsalis pedis regions. A hematoma was noted. The patient was given a total of approximately 27.5 mg of protamine sulfate in view of her ACT being in the region of approximately 300. The patient was then extubated. Upon recovery, the patient denied any headaches, nausea, vomiting, of visual symptoms or motor weakness. A right groin hematoma was noted during this time and compressed with manual compression over 30 minutes. Distal pulses remained Dopplerable. Due to the painful nature of the hematoma and associated skin discoloration, an ultrasound of the right groin was obtained promptly in the PACU. This demonstrated a small pseudoaneurysm. Vascular surgery consultation was initiated in view of the skin discoloration, and painful  nature of the pseudoaneurysm. The patient underwent intra operative repair of the pseudoaneurysm by vascular surgery. 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 PLAN: Follow-up in the clinic 2 weeks post discharge. Electronically Signed   By: [name] M.D.   On: 05/15/2020 17:07   VAS [place] GROIN PSEUDOANEURYSM  Result Date: 05/14/2020  ARTERIAL PSEUDOANEURYSM  Exam: Right groin Indications: Patient complains of groin pain, bruising and palpable knot. History: S/p catheterization. Comparison Study: No prior study on file Performing Technologist: Sharion Dove RVS  Examination Guidelines: A complete evaluation includes B-mode imaging, spectral Doppler, color Doppler, and power Doppler as needed of all accessible portions of each vessel. Bilateral testing is considered an integral part of a complete examination. Limited examinations for reoccurring indications may be performed as noted.  Findings: An area with well defined borders measuring 1.3 cm x 1.5 cm was visualized with ultrasound characteristics of a partially thrombosed pseudoaneurysm. Neck width 0.55cm  Diagnosing physician: Jamelle Haring Electronically signed by Jamelle Haring on 05/14/2020 at 7:19:23 PM.   --------------------------------------------------------------------------------    Final    IR ANGIO INTRA EXTRACRAN SEL INTERNAL CAROTID UNI L MOD SED  Result Date: 05/16/2020 CLINICAL DATA:  Patient with headaches and multiple intracranial aneurysms. EXAM: TRANSCATHETER THERAPY EMBOLIZATION COMPARISON:  Diagnostic catheter arteriogram of January 12, 2020. MEDICATIONS: Heparin 3,000 units IV. Ancef 2 g IV antibiotic was administered within 1 hour of the procedure. ANESTHESIA/SEDATION: General anesthesia CONTRAST:  Isovue 300 approximately 70 mL FLUOROSCOPY TIME:  Fluoroscopy Time: 24 minutes 36 seconds (1426 mGy). COMPLICATIONS: See below TECHNIQUE: Informed written consent was obtained from the patient after a thorough discussion of the procedural risks, benefits and alternatives. All questions were addressed. Maximal Sterile Barrier Technique was utilized including caps, mask,  sterile gowns, sterile gloves, sterile drape, hand hygiene and skin antiseptic. A timeout was performed prior to the initiation of the procedure. Initially a right radial approach was attempted. The radial artery was interrogated with ultrasound and its morphology documented. A dorsal palmar anastomosis was verified to be present. However, this approach was abandoned due to vessel spasm induced by the micro guidewire. The right groin was prepped and draped in the usual sterile fashion. Thereafter using modified Seldinger technique, transfemoral access into the right common femoral artery was obtained without difficulty. Over a 0.035 inch guidewire, an 8 [place] Pinnacle sheath was inserted. Through this, and also over 0.035 inch guidewire, a combination of a 7 Rist 95 cm sheath with a 5.5 [place] Simmons 2 support catheter was advanced to the aortic arch region and selectively positioned in the left common carotid artery. The 7 French sheath was advanced over the combination of the guidewire, and also the support diagnostic catheter to just proximal to the left common carotid bifurcation. The guidewire, and support catheter were removed. Arteriogram was then performed centered extra cranially and intracranially. FINDINGS: The left common carotid arteriogram again demonstrated the left external carotid artery and its major branches to be widely patent. The left internal carotid artery at the bulb to the cranial skull base demonstrates wide patency. Patency was maintained of the petrous, cavernous and supraclinoid segments. The left middle cerebral artery and the left anterior cerebral artery opacified normally into the capillary and venous phases. Again demonstrated was the presence of an approximately 7.8 mm x 4.5 mm saccular aneurysm arising in the left paraophthalmic region projecting superiorly and slightly posteriorly and medially. ENDOVASCULAR  TREATMENT OF THE WIDE NECK OF LEFT PARAOPHTHALMIC REGION ANEURYSM WITH  COILING AND PLACEMENT OF PIPELINE FLOW DIVERTER DEVICE Over a 0.035 inch Roadrunner guidewire, the 7 [place] Rist sheath was advanced to the distal horizontal petrous segment. The guidewire was removed. Good aspiration obtained from the hub of the 7 French sheath. A movement was then obtained injecting through the sheath. Measurements were then performed of the distal and the proximal landing zones of the flow diverter in the supraclinoid segment, and also the cavernous segment. Over a 0.014 inch standard Synchro micro guidewire, an 027 150 cm Phenom microcatheter was advanced through the sheath to the cavernous segment. Using a torque device, access was obtained with the micro guidewire across the aneurysm and into the left middle cerebral artery M2 M3 region followed by the microcatheter. The guidewire was removed. Good aspiration obtained from the hub of the distal end of the microcatheter which was in the dominant inferior division of the left middle cerebral artery M2 M3 region. A gentle control arteriogram performed through the microcatheter demonstrated safe position of the tip of the microcatheter which was then connected to continuous heparinized saline infusion. In a coaxial manner and with constant heparinized saline infusion, over a 0.014 inch standard Synchro micro guidewire, an Echelon 10 microcatheter was then advanced to the proximal cavernous segment of the left internal carotid artery. Using a torque device, access was obtained into the aneurysm with the micro guidewire followed by the advancement of the microcatheter into the aneurysm. The micro guidewire was gently retrieved and removed. Good aspiration was obtained from the intra aneurysmal microcatheter. This was then connected to continuous heparinized saline infusion. Initially, a 5 mm x 10 cm 360 Soft Target coil was advanced in a coaxial manner and with constant heparinized saline infusion to the distal end of the intra aneurysmal  microcatheter. The coil was then advanced to form a framing configuration. A control arteriogram performed through the 7 French sheath demonstrates safe position of the coil. This was then detached without difficulty. This was then followed by the advancement of a 4 mm x 8 cm 360 Soft Target coil as described above. Again using biplane roadmap technique, the coil was advanced into the aneurysm with a nice stable configuration. This coil was then detached without difficulty. A control arteriogram performed through the 7 French sheath demonstrated stagnation of contrast within the aneurysm. A 4.5 mm x 14 mm pipeline shield flow diverter was then prepped and purged with heparinized saline infusion. This was then advanced to the distal end of the 027 microcatheter. The O ring on the delivery microcatheter was loosened. With slight forward gentle traction with the right hand on the delivery micro guidewire, the microcatheter was gently retrieved unsheathing the distal wire, and a few mm of the distal device. Combination was then retrieved into the more proximal left middle cerebral M1 segment where the device was seen to open distally. Once fully opened, the combination was retrieved to the distal landing zone right at the level of the left posterior communicating artery. The intra aneurysmal microcatheter was removed without any change in the coil configuration. Using advancement of the micro guidewire, and the forward motion of the microcatheter in order to ensure close apposition to the parent artery the remainder of the device was deployed. A control arteriogram performed through the 7 French sheath demonstrated excellent apposition of the device distally and proximally without evidence of endoleak. Under constant fluoroscopic guidance, the combination of the micro guidewire, and the  microcatheter were retrieved and removed. Control arteriograms were then performed at 10 and 25 minutes post deployment of the flow  diverter. These continued to demonstrate no evidence of intra stent or of intracranial filling defects or of occlusions. During the procedure, the patient's blood pressure and neurological status remained stable. The 7 [place] Rist guide sheath was removed. The 8 French sheath was then replaced with a 6 [place] sheath. This in turn was removed with pressure being held in the right groin for approximately 30 minutes. Distal pulses remained Dopplerable in the posterior tibial and the dorsalis pedis regions. A hematoma was noted. The patient was given a total of approximately 27.5 mg of protamine sulfate in view of her ACT being in the region of approximately 300. The patient was then extubated. Upon recovery, the patient denied any headaches, nausea, vomiting, of visual symptoms or motor weakness. A right groin hematoma was noted during this time and compressed with manual compression over 30 minutes. Distal pulses remained Dopplerable. Due to the painful nature of the hematoma and associated skin discoloration, an ultrasound of the right groin was obtained promptly in the PACU. This demonstrated a small pseudoaneurysm. Vascular surgery consultation was initiated in view of the skin discoloration, and painful nature of the pseudoaneurysm. The patient underwent intra operative repair of the pseudoaneurysm by vascular surgery. 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 PLAN: Follow-up in the clinic 2 weeks post discharge. Electronically Signed   By: [name] M.D.   On: 05/15/2020 17:07    Labs:  CBC: Recent Labs    01/12/20 0705 04/11/20 0639 05/14/20 0625 05/15/20 0219 05/15/20 1450  WBC 6.5 6.2 5.4 4.6  --   HGB 13.5 12.7 11.6* 6.2* 10.1*  HCT 42.6 39.8 36.2 18.1* 29.9*  PLT 229 248 261 171  --     COAGS: Recent Labs    01/12/20 0705 04/11/20 0639  05/14/20 0625  INR 0.9 1.1 1.0  APTT 29 36 35    BMP: Recent Labs    01/12/20 0705 04/11/20 0639 05/14/20 0625 05/15/20 0219  NA 141 139 139 137  K 3.9 3.4* 3.6 4.0  CL 106 102 102 111  CO2 23 24 25  19*  GLUCOSE 97 109* 113* 142*  BUN 20 26* 11 11  CALCIUM 9.6 9.4 9.3 7.1*  CREATININE 0.69 0.69 0.63 0.54  GFRNONAA >60 >60 >60 >60  GFRAA >60  --   --   --      Assessment and Plan:  Left ICA paraophthalmic aneurysm s/p embolization using stent assisted angioplasty via right femoral approach 05/14/2020 by Dr. 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 Stanford Breed. Patient remains neurologically intact. Right femoral puncture site stable with woundvac in place, distal pulses (DPs) 1+ bilaterally- further management of right groin per vascular surgery; per notes woundvac to be removed prior to D/C, patient to begin ambulating today. Plan for possible discharge tomorrow pending right groin status. NIR to follow.   Electronically Signed: Earley Abide, PA-C 05/16/2020, 9:13 AM   I spent a total of 25 Minutes at the the patient's bedside AND on the patient's hospital floor or unit, greater than 50% of which was counseling/coordinating care for left ICA paraophthalmic aneurysm s/p embolization.

## 2020-05-16 NOTE — Progress Notes (Signed)
   May remove prevena prior to discharge per Dr. Dimple Casey PA-C

## 2020-05-16 NOTE — Progress Notes (Addendum)
  Progress Note    05/16/2020 7:47 AM 2 Days Post-Op  Subjective:  C/o soreness in the groin  Afebrile   Vitals:   05/16/20 0500 05/16/20 0600  BP: 113/61 111/66  Pulse: 77 62  Resp: 18 16  Temp:    SpO2: 97% 99%    Physical Exam: Cardiac:  regular Lungs:  Non labored Incisions:  Right groin with Prevena vac in place with good seal Extremities:  Right DP pulse is palpable; right foot warm and well perfused with motor and sensory in tact.    CBC    Component Value Date/Time   WBC 4.6 05/15/2020 0219   RBC 1.78 (L) 05/15/2020 0219   HGB 10.1 (L) 05/15/2020 1450   HCT 29.9 (L) 05/15/2020 1450   PLT 171 05/15/2020 0219   MCV 101.7 (H) 05/15/2020 0219   MCH 34.8 (H) 05/15/2020 0219   MCHC 34.3 05/15/2020 0219   RDW 14.0 05/15/2020 0219   LYMPHSABS 0.8 05/15/2020 0219   MONOABS 0.2 05/15/2020 0219   EOSABS 0.0 05/15/2020 0219   BASOSABS 0.0 05/15/2020 0219    BMET    Component Value Date/Time   NA 137 05/15/2020 0219   NA 139 01/06/2019 1022   K 4.0 05/15/2020 0219   CL 111 05/15/2020 0219   CO2 19 (L) 05/15/2020 0219   GLUCOSE 142 (H) 05/15/2020 0219   BUN 11 05/15/2020 0219   BUN 29 (H) 01/06/2019 1022   CREATININE 0.54 05/15/2020 0219   CALCIUM 7.1 (L) 05/15/2020 0219   GFRNONAA >60 05/15/2020 0219   GFRAA >60 01/12/2020 0705    INR    Component Value Date/Time   INR 1.0 05/14/2020 0625     Intake/Output Summary (Last 24 hours) at 05/16/2020 0747 Last data filed at 05/16/2020 0600 Gross per 24 hour  Intake 3487.44 ml  Output 2200 ml  Net 1287.44 ml     Assessment:  60 y.o. female is s/p:  Direct repair of right common femoral artery pseudoaneurysm  2 Days Post-Op  Plan: -right groin incision with Prevena vac in place with good seal.  She has an easily palpable right DP pulse and motor and sensory are in tact.  -acute blood loss anemia improved after 2 units PRBC's    Leontine Locket, PA-C [facility] [phone] 05/16/2020 7:47 AM  VASCULAR STAFF ADDENDUM: I have independently interviewed and examined the patient. I agree with the above.  Looks great s/p direct repair of right femoral artery pseudoaneurysm PT/OT/OOB/Ambulate Keep prevena on groin while inpatient. Please call prior to discharge and we will remove prevena and evaluate wound.  Yevonne Aline. Stanford Breed, MD [facility] Phone Number: [phone] 05/16/2020 10:49 AM

## 2020-05-16 NOTE — Progress Notes (Signed)
NIR.  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 Stanford Breed.  Patient awake and alert laying in bed watching TV with no complaints at this time. Husband at bedside.  Alert, awake, and oriented x3. Speech and comprehension intact. PERRL bilaterally. Can spontaneously move all extremities. No pronator drift. Distal pulses (DPs) 1+ bilaterally. Tremors unchanged from prior to procedure. Right femoral puncture site soft with woundvac in place, no active bleeding or hematoma noted.  Plan to stay additional night in neuro ICU. Continue taking Plavix 37.5 mg once daily and Aspirin 81 mg once daily. Further management of right groin per vascular surgery- appreciate and agree with management. PT recommending home health PT along with walker on discharge. Plan for possible discharge tomorrow pending right groin status. NIR to follow.   Bea Graff Charlyne Robertshaw, PA-C 05/16/2020, 3:40 PM

## 2020-05-16 NOTE — Progress Notes (Signed)
Occupational Therapy Evaluation  Pt primarily limited by tremors, which are worsened by pain and cold temperatures. Pt mobilizing with Min A @ RW level. Husband states he will be able to assist pt after DC. Will follow acutely to facilitate safe DC home with use of AE and DME.     05/16/20 1600  OT Visit Information  Last OT Received On 05/16/20  Assistance Needed +2  Reason for Co-Treatment For patient/therapist safety  OT goals addressed during session ADL's and self-care  History of Present Illness 60 yo s/p Left ICA paraophthalmic aneurysm s/p embolization using stent assisted angioplasty via right femoral approach 05/14/2020 by Dr. 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 05/14/2020  Precautions  Precautions Fall  Precaution Comments significant essential tremors; wound vac R groin  Home Living  Family/patient expects to be discharged to: Private residence  Living Arrangements Spouse/significant other  Available Help at Discharge Family  Type of [place] to enter  Entrance Stairs-Number of Steps 3  [place] One level  Bathroom Shower/Tub Tub/shower unit;[place]  [place] held shower head;None  Prior Function  Level of Independence Independent;Needs assistance  Gait / Transfers Assistance Needed unsteady at baseilne due to tremors  ADL's / [place] husband assists as needed with ADL  Communication / Swallowing Assistance Needed no difference from baseline  Comments husband works 8 hrs/day; family friends check on  Engineer, petroleum Other (comment) (essentail tremor affects voice)  Pain Assessment  Faces Pain Scale 8  Pain Location R groin  Pain Descriptors / Indicators Discomfort;Grimacing  Cognition  Arousal/Alertness Awake/alert  Behavior During Therapy WFL for tasks assessed/performed  Overall  Cognitive Status Within Functional Limits for tasks assessed  Upper Extremity Assessment  Upper Extremity Assessment Generalized weakness (Tremors interfere with ability to complete ADL tasks)  ADL  Overall ADL's  Needs assistance/impaired  Eating/Feeding Moderate assistance  Eating/Feeding Details (indicate cue type and reason) when tremors are "bad", husband assists with feeding  Grooming Minimal assistance  Upper Body Bathing Minimal assistance;Sitting  Lower Body Bathing Moderate assistance;Sit to/from stand  Upper Body Dressing  Moderate assistance;Sitting  Lower Body Dressing Moderate assistance;Sit to/from Retail buyer Minimal assistance;Ambulation;RW;Comfort height toilet;Grab bars  Toileting- Clothing Manipulation and Hygiene Minimal assistance;Sit to/from stand  Functional mobility during ADLs Minimal assistance;Rolling walker;Cueing for safety  General ADL Comments Tremors interfere with ability to complete ADL tasks; uses lidded tumbler cup; does not shower due to being afraid of falls in shower; would benefit from tub bench - pt/husband shown picture and described transfer technique  Vision- History  Patient Visual Report No change from baseline  Bed Mobility  Overal bed mobility Needs Assistance  Bed Mobility Supine to Sit  Supine to sit Min assist  General bed mobility comments truncal assist and stability while pt scooting to EOB  Transfers  Overall transfer level Needs assistance  Equipment used Rolling walker (2 wheeled);None  Transfers Sit to/from Stand  Sit to [employer] assist  General transfer comment cues for hand placement/safety, stability assist  Balance  Overall balance assessment Needs assistance  Sitting-balance support Single extremity supported;No upper extremity supported  Sitting balance-Leahy Scale Fair  Sitting balance - Comments able to sit without use of UE's  Standing balance support During functional activity;Bilateral upper extremity  supported  Standing balance-Leahy Scale Poor  Standing balance comment reliant on some type of AD or  external support at time of evaluation or balance.  General Comments  General comments (skin integrity, edema, etc.) HR increasing to 140s however pt with increased tremors  OT - End of Session  Equipment Utilized During Treatment Rolling walker  Activity Tolerance Patient tolerated treatment well  Patient left in chair;with call bell/phone within reach;with family/visitor present  Nurse Communication Mobility status  OT Assessment  OT Recommendation/Assessment Patient needs continued OT Services  OT Visit Diagnosis Unsteadiness on feet (R26.81);Other abnormalities of gait and mobility (R26.89);Muscle weakness (generalized) (M62.81);Pain  Pain - Right/Left Right  Pain - part of body  (groin)  OT Problem List Impaired balance (sitting and/or standing);Decreased coordination;Decreased safety awareness;Decreased knowledge of use of DME or AE;Obesity;Impaired UE functional use;Pain  OT Plan  OT Frequency (ACUTE ONLY) Min 2X/week  OT Treatment/Interventions (ACUTE ONLY) Self-care/ADL training;Therapeutic exercise;Neuromuscular education;DME and/or AE instruction;Energy conservation;Therapeutic activities;Patient/family education;Balance training  AM-PAC OT "6 Clicks" Daily Activity Outcome Measure (Version 2)  Help from another person eating meals? 2  Help from another person taking care of personal grooming? 2  Help from another person toileting, which includes using toliet, bedpan, or urinal? 3  Help from another person bathing (including washing, rinsing, drying)? 2  Help from another person to put on and taking off regular upper body clothing? 2  Help from another person to put on and taking off regular lower body clothing? 2  6 Click Score 13  OT Recommendation  Follow Up Recommendations No OT follow up;Supervision/Assistance - 24 hour (initially)  OT Equipment Tub/shower bench;Other  (comment) (RW)  Individuals Consulted  Consulted and Agree with Results and Recommendations Patient;Family member/caregiver  Family Member Consulted husband  Acute Rehab OT Goals  Patient Stated Goal get home able to do for myself with husband at work.  OT Goal Formulation With patient/family  Time For Goal Achievement 05/30/20  Potential to Achieve Goals Good  OT Time Calculation  OT Start Time (ACUTE ONLY) 1251  OT Stop Time (ACUTE ONLY) 1323  OT Time Calculation (min) 32 min  OT General Charges  $OT Visit 1 Visit  OT Evaluation  $OT Eval Moderate Complexity 1 Mod  Written Expression  Dominant Hand Right  Maurie Boettcher, OT/L   Acute OT Clinical Specialist Acute Rehabilitation Services Pager [phone] Office [phone]

## 2020-05-17 ENCOUNTER — Encounter: Payer: Self-pay | Admitting: Radiology

## 2020-05-17 ENCOUNTER — Other Ambulatory Visit (HOSPITAL_COMMUNITY): Payer: Self-pay | Admitting: Radiology

## 2020-05-17 ENCOUNTER — Other Ambulatory Visit: Payer: Self-pay | Admitting: Radiology

## 2020-05-17 DIAGNOSIS — R531 Weakness: Secondary | ICD-10-CM | POA: Diagnosis not present

## 2020-05-17 DIAGNOSIS — I671 Cerebral aneurysm, nonruptured: Secondary | ICD-10-CM

## 2020-05-17 MED ORDER — HYDROCODONE-ACETAMINOPHEN 5-325 MG PO TABS: 1.0000 | ORAL_TABLET | Freq: Four times a day (QID) | ORAL | 0 refills | Status: AC | PRN

## 2020-05-17 MED ORDER — HYDROCODONE-ACETAMINOPHEN 5-325 MG PO TABS
1.0000 | ORAL_TABLET | Freq: Four times a day (QID) | ORAL | 0 refills | Status: DC | PRN
Start: 2020-05-17 — End: 2020-05-17

## 2020-05-17 NOTE — Progress Notes (Signed)
After summary visit reviewed with patient and patient's spouse.  PA notified that patient needs prescription for pain medication as needed.  PA notified and will send prescription to patient's pharmacy.  Walker and all belongings sent home with patient.  Shower seat will be delivered to patient's home.  Patient is alert and oriented and discharge home with spouse.

## 2020-05-17 NOTE — TOC Transition Note (Signed)
Transition of Care [facility]) - CM/SW Discharge Note   Patient Details  Name: RAESHA COONROD MRN: [id number] Date of Birth: May 14, 1960  [facility]) CM/SW Contact:  Ella Bodo, RN Phone Number: 05/17/2020, 11:53 AM   Clinical Narrative:  60 yo s/p Left ICA paraophthalmic aneurysm s/p embolization using stent assisted angioplasty via right femoral approach 05/14/2020 by Dr. 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 PTA, pt lives at home with spouse, who can provide 24h assistance initially at home; she needs assistance with ADLS intermittently due to tremors.  PT recommending [place] follow up, and pt/spouse agreeable to [facility] follow up.  Referral to [facility], per pt/family choice.  Referral to [place] for DME needs; RW and shower seat to be delivered to bedside prior to dc home.       Final next level of care: [place] Barriers to Discharge: Barriers Resolved   Patient Goals and CMS Choice Patient states their goals for this hospitalization and ongoing recovery are:: to go home CMS Medicare.gov Compare Post Acute Care list provided to:: Patient Choice offered to / list presented to : Patient                        Discharge Plan and Services   Discharge Planning Services: CM Consult Post Acute Care Choice: Home Health          DME Arranged: Walker rolling,Shower stool DME Agency: [employer] Date DME Agency Contacted: 05/17/20 Time DME Agency Contacted: 5638 Representative spoke with at DME Agency: [place] Arranged: PT [place]: [place] Date [place]: 05/17/20 Time [place]: 1105 Representative spoke with at [place]: [place] ([place]) Interventions     Readmission Risk Interventions Readmission Risk Prevention Plan 05/17/2020  Post Dischage Appt Complete  Medication Screening Complete   Transportation Screening Complete  Some recent data might be hidden   Reinaldo Raddle, RN, BSN  Trauma/Neuro ICU Case Manager [phone]

## 2020-05-17 NOTE — Progress Notes (Addendum)
  Progress Note    05/17/2020 8:12 AM 3 Days Post-Op  Subjective:  No pain  But groin is tender to touch. She says she might go home today.   Vitals:   05/17/20 0700 05/17/20 0800  BP: 115/74 118/62  Pulse: 76 79  Resp: (!) 23 17  Temp:    SpO2: 100% 100%    Physical Exam: Cardiac:  RRR Lungs:  CTAB Extremities:  Right groin Prevena dressing place with good seal. Scant output. Both feet warm and well perfused with + Doppler signals   CBC    Component Value Date/Time   WBC 4.6 05/15/2020 0219   RBC 1.78 (L) 05/15/2020 0219   HGB 10.1 (L) 05/15/2020 1450   HCT 29.9 (L) 05/15/2020 1450   PLT 171 05/15/2020 0219   MCV 101.7 (H) 05/15/2020 0219   MCH 34.8 (H) 05/15/2020 0219   MCHC 34.3 05/15/2020 0219   RDW 14.0 05/15/2020 0219   LYMPHSABS 0.8 05/15/2020 0219   MONOABS 0.2 05/15/2020 0219   EOSABS 0.0 05/15/2020 0219   BASOSABS 0.0 05/15/2020 0219    BMET    Component Value Date/Time   NA 137 05/15/2020 0219   NA 139 01/06/2019 1022   K 4.0 05/15/2020 0219   CL 111 05/15/2020 0219   CO2 19 (L) 05/15/2020 0219   GLUCOSE 142 (H) 05/15/2020 0219   BUN 11 05/15/2020 0219   BUN 29 (H) 01/06/2019 1022   CREATININE 0.54 05/15/2020 0219   CALCIUM 7.1 (L) 05/15/2020 0219   GFRNONAA >60 05/15/2020 0219   GFRAA >60 01/12/2020 0705     Intake/Output Summary (Last 24 hours) at 05/17/2020 0812 Last data filed at 05/17/2020 0600 Gross per 24 hour  Intake 240 ml  Output 850 ml  Net -610 ml    HOSPITAL MEDICATIONS Scheduled Meds: . sodium chloride   Intravenous Once  . aspirin  81 mg Oral Daily   Or  . aspirin  81 mg Per Tube Daily  . Chlorhexidine Gluconate Cloth  6 each Topical Q0600  . clopidogrel  37.5 mg Per Tube Daily   Or  . clopidogrel  37.5 mg Oral Daily  . divalproex  500 mg Oral QHS  . mupirocin ointment  1 application Nasal BID  . niMODipine  0-60 mg Oral 60 min Pre-Op  . pantoprazole  40 mg Oral Daily  . rosuvastatin  10 mg Oral QHS    Continuous Infusions: . sodium chloride     PRN Meds:.sodium chloride, acetaminophen **OR** acetaminophen (TYLENOL) oral liquid 160 mg/5 mL **OR** acetaminophen, guaiFENesin-dextromethorphan, hydrALAZINE, morphine injection, ondansetron, phenol, traMADol  Assessment:59 y.o. female is s/p:  Direct repair of right common femoral artery pseudoaneurysm  3 Days Post-Op    LE well perfused.    Plan: -Will make arrangements for our staff to remove dressing prior to discharge and inspect incision. Dressing removed   No active drainage, vac canister empty Firmness without frank hematoma surrounding incision.    Risa Grill, PA-C [facility] [phone] 05/17/2020  8:12 AM

## 2020-05-17 NOTE — Discharge Summary (Signed)
Physician Discharge Summary      Patient ID: Lindsay Erickson MRN: [id number] DOB/AGE: [birth year] 60 y.o.  Admit date: 05/14/2020 Discharge date: 05/17/2020  Admission Diagnoses: Active Problems:   Brain aneurysm  Discharge Diagnoses:  Active Problems:   Brain aneurysm    Procedures: Procedure(s): S/P coil assisted  Flow diverter embolization of wide neck Lt paraophthalmic aneurysm with stasis. REPAIR (R)CFA FALSE ANEURYSM APPLICATION OF INCISIONAL WOUND VAC  Discharged Condition: good  Hospital Course: Pt is 60 yo female with (L)ICA aneurysm scheduled for intervention. Brought to NIR on 1/10 and underwent successful coil assisted Flow diverter embolization of (L) paraophthalmic aneurysm with stasis. Procedure without immediate complications, however, pt c/o (R)groin pain in recovery with associated swelling, tenderness, and ecchymosis. US shows small pseudoaneurysm. Vascular surgery team consulted and pt was taken to OR for cutdown and direct repair. She tolerated that procedure well and an incisional wound vac was placed. Pt was sent to Neuro ICU in stable condition. She has worked with PT and OT, who feel the pt would benefit from [employer]. She has otherwise done well from her initial procedure, no neurologic events. She is determined to be stable for discharge on 1/13. HH PT and OT will be arranged. The wound vac was removed prior to discharge and wound instructions and follow up with vascular team provided. All medications, instructions, and follow up plans reviewed with pt and family.    Consults: vascular surgery   Discharge Exam: Blood pressure 124/85, pulse 81, temperature 98.5 F (36.9 C), temperature source Oral, resp. rate 20, height 5\' 2"  (1.575 m), weight 55.8 kg, SpO2 100 %. General: NAD Lungs: CTA without w/r/r Heart: Regular Abdomen: soft, NT Ext: Legs warm, bilateral pedal pulses. Neuro: A&O x 3 No focal  deficits.    Disposition: Discharge disposition: 01-Home or Self Care       Discharge Instructions    Activity as tolerated - No restrictions   Complete by: As directed    Avoid bending, stooping, lifting, pushing/pulling over 10 lbs for 2 weeks.   Call MD for:  difficulty breathing, headache or visual disturbances   Complete by: As directed    Call MD for:  persistant dizziness or light-headedness   Complete by: As directed    Call MD for:  persistant nausea and vomiting   Complete by: As directed    Call MD for:  redness, tenderness, or signs of infection (pain, swelling, redness, odor or green/yellow discharge around incision site)   Complete by: As directed    Call MD for:  severe uncontrolled pain   Complete by: As directed    Call MD for:  temperature >100.4   Complete by: As directed    Diet - low sodium heart healthy   Complete by: As directed    Discharge wound care:   Complete by: As directed    Per instructions from vascular team   Increase activity slowly   Complete by: As directed    Lifting restrictions   Complete by: As directed    No driving 2 weeks     Allergies as of 05/17/2020      Reactions   Gabapentin Other (See Comments)   insomnia   Primidone Other (See Comments)   Ineffective    Propranolol Swelling   Can't take beta blockers   Venlafaxine Other (See Comments)   Irritable/hyper   Latex Itching   Nickel    Codeine Itching      Medication List  TAKE these medications   acetaminophen 500 MG tablet Commonly known as: TYLENOL Take 500 mg by mouth every 8 (eight) hours as needed for moderate pain.   aspirin EC 81 MG tablet Take 81 mg by mouth daily. Swallow whole.   clopidogrel 75 MG tablet Commonly known as: PLAVIX Take 37.5 mg by mouth every other day.   divalproex 500 MG 24 hr tablet Commonly known as: DEPAKOTE ER Take 1 tablet (500 mg total) by mouth daily. What changed: when to take this   escitalopram 5 MG  tablet Commonly known as: LEXAPRO Take 1 tablet (5 mg total) by mouth daily.   levocetirizine 5 MG tablet Commonly known as: XYZAL Take 5 mg by mouth as needed.   rosuvastatin 10 MG tablet Commonly known as: CRESTOR Take 1 tablet (10 mg total) by mouth daily. What changed: when to take this            Durable Medical Equipment  (From admission, onward)         Start     Ordered   05/17/20 1034  For home use only DME Shower stool  Once        05/17/20 1033   05/17/20 1032  For home use only DME Walker rolling  Once       Question Answer Comment  Walker: With Burt Wheels   Patient needs a walker to treat with the following condition Brain aneurysm      05/17/20 1033           Discharge Care Instructions  (From admission, onward)         Start     Ordered   05/17/20 0000  Discharge wound care:       Comments: Per instructions from vascular team   05/17/20 1050          Follow-up Information    [name], MD. Go in 2 week(s).   Specialties: Interventional Radiology, Radiology Why: Clinic will call you to schedule appointment date/time Contact information: [place] [address] [phone]               Signed: Ascencion Dike PA-C 05/17/2020, 10:50 AM

## 2020-05-17 NOTE — Progress Notes (Signed)
Occupational Therapy Treatment Patient Details Name: Lindsay Erickson MRN: [id number] DOB: March 30, 1961 Today's Date: 05/17/2020    History of present illness 60 yo s/p Left ICA paraophthalmic aneurysm s/p embolization using stent assisted angioplasty via right femoral approach 05/14/2020 by Dr. 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 Able to complete simulated tub bench transfer with minguard A. Also educated on compensatory strategies for self feeding and options on AE. Recommend pt follow up with an OT at the neuro outpt center after pt finishes with HHPT to further assess available options for AE and compensatory strategies to help increase independence with ADL tasks and reduce risk of falls. Pt educated that she will need a referral from her doctor. Pt/husband verbalized understanding. All further OT to be addressed in next venue of care.   Follow Up Recommendations  Other (comment) (outpt OT at the neuro outpt center after finished with HHPT)    Equipment Recommendations  Tub/shower bench;Other (comment)    Recommendations for Other Services      Precautions / Restrictions Precautions Precautions: Fall       Mobility Bed Mobility               General bed mobility comments: OOB in chair  Transfers Overall transfer level: Needs assistance   Transfers: Sit to/from Stand;Stand Pivot Transfers Sit to Stand: Min guard Stand pivot transfers: Min guard            Balance                                           ADL either performed or assessed with clinical judgement   ADL                                         General ADL Comments: Educated on tub bench transfer technique. Pt ablet o return demonstrte. Husband present for education     Vision        Perception     Praxis      Cognition Arousal/Alertness: Awake/alert Behavior During Therapy: WFL for tasks assessed/performed Overall Cognitive Status: Within Functional Limits for tasks assessed                                          Exercises     Shoulder Instructions       General Comments Educated on technique to put pressure through BUE and push down into RW and keep arms tucked to sides to reduce tremors/instability when walking - appears to help; Educated on avilability of AE to help with feeidng. Recommend pt try cold therapy  - placing cooling blanket/ice pack on forearms 15 min prior to eating to attempt to reduce tremors    Pertinent Vitals/ Pain       Pain Assessment: Faces Faces Pain Scale: Hurts a little bit Pain Location: R groin Pain Descriptors / Indicators: Discomfort;Grimacing Pain Intervention(s): Limited activity within patient's tolerance  Home Living  Prior Functioning/Environment              Frequency  Min 2X/week        Progress Toward Goals  OT Goals(current goals can now be found in the care plan section)  Progress towards OT goals: Progressing toward goals  Acute Rehab OT Goals Patient Stated Goal: get home able to do for myself with husband at work. OT Goal Formulation: With patient/family Time For Goal Achievement: 05/30/20 Potential to Achieve Goals: Good ADL Goals Pt Will Perform Eating: with min assist;with adaptive utensils;sitting Pt Will Transfer to Toilet: with supervision;ambulating;regular height toilet Pt Will Perform Tub/Shower Transfer: with min assist;tub bench;ambulating  Plan Other (comment);Discharge plan needs to be updated    Co-evaluation                 AM-PAC OT "6 Clicks" Daily Activity     Outcome Measure   Help from another person eating meals?: A Lot Help from another person taking care of personal grooming?: A  Lot Help from another person toileting, which includes using toliet, bedpan, or urinal?: A Little Help from another person bathing (including washing, rinsing, drying)?: A Lot Help from another person to put on and taking off regular upper body clothing?: A Lot Help from another person to put on and taking off regular lower body clothing?: A Lot 6 Click Score: 13    End of Session Equipment Utilized During Treatment: Rolling walker  OT Visit Diagnosis: Unsteadiness on feet (R26.81);Other abnormalities of gait and mobility (R26.89);Muscle weakness (generalized) (M62.81);Pain Pain - Right/Left: Right Pain - part of body:  (groin)   Activity Tolerance Patient tolerated treatment well   Patient Left in chair;with call bell/phone within reach;with family/visitor present   Nurse Communication Other (comment) (DC needs)        Time: 9381-8299 OT Time Calculation (min): 29 min  Charges: OT General Charges $OT Visit: 1 Visit OT Treatments $Self Care/Home Management : 23-37 mins  Maurie Boettcher, OT/L   Acute OT Clinical Specialist [place] Pager [phone] Office [phone]    [facility] 05/17/2020, 11:55 AM

## 2020-05-17 NOTE — Discharge Instructions (Signed)
Endovascular Therapy for Cerebral Aneurysm, Care After The following information offers guidance on how to care for yourself after your procedure. Your health care provider may also give you more specific instructions. If you have problems or questions, contact your health care provider. What can I expect after the procedure? After the procedure, it is common to have:  Pain, tenderness, and swelling around your incision.  Headaches. Follow these instructions at home: Medicines  Take over-the-counter and prescription medicines only as told by your health care provider.  If you are taking blood thinners: ? Talk with your health care provider before you take any medicines that contain aspirin or NSAIDs, such as ibuprofen. These medicines increase your risk for dangerous bleeding. ? Take your medicine exactly as told, at the same time every day. ? Avoid activities that could cause injury or bruising, and follow instructions about how to prevent falls. ? Wear a medical alert bracelet or carry a card that lists what medicines you take.  Ask your health care provider if the medicine prescribed to you requires you to avoid driving or using machinery. Eating and drinking  Drink enough fluid to keep your urine pale yellow.  Eat a healthy diet. This includes plenty of fruits and vegetables, whole grains, low-fat dairy products, and lean protein. Incision care  Follow instructions from your health care provider about how to take care of your incision. Make sure you: ? Wash your hands with soap and water for at least 20 seconds before and after you change your bandage (dressing). If soap and water are not available, use hand sanitizer. ? Change your dressing as told by your health care provider. ? Leave stitches (sutures), skin glue, or adhesive strips in place. These skin closures may need to stay in place for 2 weeks or longer. If adhesive strip edges start to loosen and curl up, you may trim the  loose edges. Do not remove adhesive strips completely unless your health care provider tells you to do that.  Check your incision area every day for signs of infection. Check for: ? Redness, swelling, or pain. ? Fluid or blood. ? Warmth. ? Pus or a bad smell.   Activity  Return to your normal activities as told by your health care provider. Ask your health care provider what activities are safe for you. Most people can return to normal activities 2-6 weeks after the procedure.  Do not drive until your health care provider approves.  Do not lift anything that is heavier than 10 lb (4.5 kg), or the limit that you are told, until your health care provider says that it is safe.  Exercise regularly, as directed by your health care provider.  Attend rehabilitation therapy as told by your health care provider. This may include: ? Physical and occupational therapy. ? Speech-language therapy. ? Brain exercises. ? Balance exercises. ? Individual or group therapy. ? Education about your condition and treatment.   General instructions  Do not use any products that contain nicotine or tobacco. These products include cigarettes, chewing tobacco, and vaping devices, such as e-cigarettes. If you need help quitting, ask your health care provider.  Do not take baths, swim, or use a hot tub until your health care provider approves. Ask your health care provider if you may take showers. You may only be allowed to take sponge baths.  Manage your stress. If you need help with this, talk with your health care provider.  Wear compression stockings as told by your health  care provider. These stockings help to prevent blood clots and reduce swelling in your legs.  Keep all follow-up visits. This is important. Contact a health care provider if:  You have redness, swelling, or pain around your incision.  You have fluid or blood coming from your incision.  Your incision feels warm to the touch.  You  have pus or a bad smell coming from your incision.  You have a fever. Get help right away if:  You have: ? Stiffness in your neck. ? Pain, numbness, weakness, or swelling in your legs. ? Severe chest pain. ? Difficulty breathing. ? Confusion.  You have any symptoms of a stroke. "BE FAST" is an easy way to remember the main warning signs of a stroke: ? B - Balance. Signs are dizziness, sudden trouble walking, or loss of balance. ? E - Eyes. Signs are trouble seeing or a sudden change in vision. ? F - Face. Signs are sudden weakness or numbness of the face, or the face or eyelid drooping on one side. ? A - Arms. Signs are weakness or numbness in an arm. This happens suddenly and usually on one side of the body. ? S - Speech. Signs are sudden trouble speaking, slurred speech, or trouble understanding what people say. ? T - Time. Time to call emergency services. Write down what time symptoms started.  You have other signs of a stroke, such as: ? A sudden, severe headache with no known cause. ? Nausea or vomiting. ? A seizure. These symptoms may represent a serious problem that is an emergency. Do not wait to see if the symptoms will go away. Get medical help right away. Call your local emergency services (911 in the [place] Do not drive yourself to the hospital.   Summary  After this procedure, it is common to have some pain and swelling around your incision.  Follow instructions from your health care provider about how to take care of your incision. Check for signs of infection every day.  Most people can return to normal activities 2-6 weeks after the procedure. Ask your health care provider what activities are safe for you during recovery.  Do not drive until your health care provider approves.  Do not smoke or use any products that contain nicotine or tobacco. If you need help quitting, ask your health care provider. This information is not intended to replace advice given to you  by your health care provider. Make sure you discuss any questions you have with your health care provider. Document Revised: 01/10/2020 Document Reviewed: 01/10/2020 Elsevier Patient Education  2021 [employer].

## 2020-05-19 DIAGNOSIS — E785 Hyperlipidemia, unspecified: Secondary | ICD-10-CM | POA: Diagnosis not present

## 2020-05-19 DIAGNOSIS — G43909 Migraine, unspecified, not intractable, without status migrainosus: Secondary | ICD-10-CM | POA: Diagnosis not present

## 2020-05-19 DIAGNOSIS — F419 Anxiety disorder, unspecified: Secondary | ICD-10-CM | POA: Diagnosis not present

## 2020-05-19 DIAGNOSIS — F32A Depression, unspecified: Secondary | ICD-10-CM | POA: Diagnosis not present

## 2020-05-19 DIAGNOSIS — G40909 Epilepsy, unspecified, not intractable, without status epilepticus: Secondary | ICD-10-CM | POA: Diagnosis not present

## 2020-05-19 DIAGNOSIS — I739 Peripheral vascular disease, unspecified: Secondary | ICD-10-CM | POA: Diagnosis not present

## 2020-05-19 DIAGNOSIS — G250 Essential tremor: Secondary | ICD-10-CM | POA: Diagnosis not present

## 2020-05-19 DIAGNOSIS — D649 Anemia, unspecified: Secondary | ICD-10-CM | POA: Diagnosis not present

## 2020-05-19 DIAGNOSIS — Z48812 Encounter for surgical aftercare following surgery on the circulatory system: Secondary | ICD-10-CM | POA: Diagnosis not present

## 2020-05-23 ENCOUNTER — Other Ambulatory Visit: Payer: Self-pay

## 2020-05-23 DIAGNOSIS — E785 Hyperlipidemia, unspecified: Secondary | ICD-10-CM | POA: Diagnosis not present

## 2020-05-23 DIAGNOSIS — G43909 Migraine, unspecified, not intractable, without status migrainosus: Secondary | ICD-10-CM | POA: Diagnosis not present

## 2020-05-23 DIAGNOSIS — I739 Peripheral vascular disease, unspecified: Secondary | ICD-10-CM | POA: Diagnosis not present

## 2020-05-23 DIAGNOSIS — F32A Depression, unspecified: Secondary | ICD-10-CM | POA: Diagnosis not present

## 2020-05-23 DIAGNOSIS — Z48812 Encounter for surgical aftercare following surgery on the circulatory system: Secondary | ICD-10-CM | POA: Diagnosis not present

## 2020-05-23 DIAGNOSIS — F419 Anxiety disorder, unspecified: Secondary | ICD-10-CM | POA: Diagnosis not present

## 2020-05-23 DIAGNOSIS — I724 Aneurysm of artery of lower extremity: Secondary | ICD-10-CM

## 2020-05-23 DIAGNOSIS — G250 Essential tremor: Secondary | ICD-10-CM | POA: Diagnosis not present

## 2020-05-23 DIAGNOSIS — D649 Anemia, unspecified: Secondary | ICD-10-CM | POA: Diagnosis not present

## 2020-05-23 DIAGNOSIS — G40909 Epilepsy, unspecified, not intractable, without status epilepticus: Secondary | ICD-10-CM | POA: Diagnosis not present

## 2020-05-29 DIAGNOSIS — G250 Essential tremor: Secondary | ICD-10-CM | POA: Diagnosis not present

## 2020-05-29 DIAGNOSIS — D649 Anemia, unspecified: Secondary | ICD-10-CM | POA: Diagnosis not present

## 2020-05-29 DIAGNOSIS — G43909 Migraine, unspecified, not intractable, without status migrainosus: Secondary | ICD-10-CM | POA: Diagnosis not present

## 2020-05-29 DIAGNOSIS — F419 Anxiety disorder, unspecified: Secondary | ICD-10-CM | POA: Diagnosis not present

## 2020-05-29 DIAGNOSIS — E785 Hyperlipidemia, unspecified: Secondary | ICD-10-CM | POA: Diagnosis not present

## 2020-05-29 DIAGNOSIS — G40909 Epilepsy, unspecified, not intractable, without status epilepticus: Secondary | ICD-10-CM | POA: Diagnosis not present

## 2020-05-29 DIAGNOSIS — Z48812 Encounter for surgical aftercare following surgery on the circulatory system: Secondary | ICD-10-CM | POA: Diagnosis not present

## 2020-05-29 DIAGNOSIS — I739 Peripheral vascular disease, unspecified: Secondary | ICD-10-CM | POA: Diagnosis not present

## 2020-05-29 DIAGNOSIS — F32A Depression, unspecified: Secondary | ICD-10-CM | POA: Diagnosis not present

## 2020-05-30 DIAGNOSIS — E785 Hyperlipidemia, unspecified: Secondary | ICD-10-CM | POA: Diagnosis not present

## 2020-05-30 DIAGNOSIS — Z6821 Body mass index (BMI) 21.0-21.9, adult: Secondary | ICD-10-CM | POA: Diagnosis not present

## 2020-05-30 DIAGNOSIS — Z48812 Encounter for surgical aftercare following surgery on the circulatory system: Secondary | ICD-10-CM | POA: Diagnosis not present

## 2020-05-30 DIAGNOSIS — D649 Anemia, unspecified: Secondary | ICD-10-CM | POA: Diagnosis not present

## 2020-05-30 DIAGNOSIS — I739 Peripheral vascular disease, unspecified: Secondary | ICD-10-CM | POA: Diagnosis not present

## 2020-05-30 DIAGNOSIS — G43909 Migraine, unspecified, not intractable, without status migrainosus: Secondary | ICD-10-CM | POA: Diagnosis not present

## 2020-05-30 DIAGNOSIS — F32A Depression, unspecified: Secondary | ICD-10-CM | POA: Diagnosis not present

## 2020-05-30 DIAGNOSIS — F419 Anxiety disorder, unspecified: Secondary | ICD-10-CM | POA: Diagnosis not present

## 2020-05-30 DIAGNOSIS — T819XXA Unspecified complication of procedure, initial encounter: Secondary | ICD-10-CM | POA: Diagnosis not present

## 2020-05-30 DIAGNOSIS — G40909 Epilepsy, unspecified, not intractable, without status epilepticus: Secondary | ICD-10-CM | POA: Diagnosis not present

## 2020-05-30 DIAGNOSIS — G250 Essential tremor: Secondary | ICD-10-CM | POA: Diagnosis not present

## 2020-05-30 DIAGNOSIS — D62 Acute posthemorrhagic anemia: Secondary | ICD-10-CM | POA: Diagnosis not present

## 2020-05-30 DIAGNOSIS — Z7689 Persons encountering health services in other specified circumstances: Secondary | ICD-10-CM | POA: Diagnosis not present

## 2020-06-04 DIAGNOSIS — F419 Anxiety disorder, unspecified: Secondary | ICD-10-CM | POA: Diagnosis not present

## 2020-06-04 DIAGNOSIS — E785 Hyperlipidemia, unspecified: Secondary | ICD-10-CM | POA: Diagnosis not present

## 2020-06-04 DIAGNOSIS — G43909 Migraine, unspecified, not intractable, without status migrainosus: Secondary | ICD-10-CM | POA: Diagnosis not present

## 2020-06-04 DIAGNOSIS — Z48812 Encounter for surgical aftercare following surgery on the circulatory system: Secondary | ICD-10-CM | POA: Diagnosis not present

## 2020-06-04 DIAGNOSIS — D649 Anemia, unspecified: Secondary | ICD-10-CM | POA: Diagnosis not present

## 2020-06-04 DIAGNOSIS — F32A Depression, unspecified: Secondary | ICD-10-CM | POA: Diagnosis not present

## 2020-06-04 DIAGNOSIS — G40909 Epilepsy, unspecified, not intractable, without status epilepticus: Secondary | ICD-10-CM | POA: Diagnosis not present

## 2020-06-04 DIAGNOSIS — I739 Peripheral vascular disease, unspecified: Secondary | ICD-10-CM | POA: Diagnosis not present

## 2020-06-04 DIAGNOSIS — G250 Essential tremor: Secondary | ICD-10-CM | POA: Diagnosis not present

## 2020-06-05 ENCOUNTER — Telehealth: Payer: Self-pay | Admitting: Student

## 2020-06-05 ENCOUNTER — Other Ambulatory Visit: Payer: Self-pay

## 2020-06-05 ENCOUNTER — Other Ambulatory Visit (HOSPITAL_COMMUNITY): Payer: Self-pay

## 2020-06-05 ENCOUNTER — Ambulatory Visit (HOSPITAL_COMMUNITY)
Admission: RE | Admit: 2020-06-05 | Discharge: 2020-06-05 | Disposition: A | Discharge status: Home / Self Care | Payer: Medicare HMO | Source: Ambulatory Visit | Attending: Radiology | Admitting: Radiology

## 2020-06-05 DIAGNOSIS — I671 Cerebral aneurysm, nonruptured: Secondary | ICD-10-CM

## 2020-06-05 DIAGNOSIS — Z9889 Other specified postprocedural states: Secondary | ICD-10-CM | POA: Diagnosis not present

## 2020-06-05 LAB — PLATELET INHIBITION P2Y12: Platelet Function  P2Y12: 4 [PRU] — ABNORMAL LOW (ref 182–335)

## 2020-06-05 NOTE — Telephone Encounter (Signed)
NIR.  P2Y12 today- 4 PRU. Discussed with Dr. Estanislado Pandy who recommends below medication adjustments: 1- Begin taking Plavix 37.5 mg once every other day. 2- Continue taking Aspirin 81 mg once daily.  Called patient at [phone] to discuss above. All questions answered and concerns addressed. Patient conveys understanding and agrees with plan.   Bea Graff Linus Weckerly, PA-C 06/05/2020, 3:44 PM

## 2020-06-06 HISTORY — PX: IR RADIOLOGIST EVAL & MGMT: IMG5224

## 2020-06-11 DIAGNOSIS — F419 Anxiety disorder, unspecified: Secondary | ICD-10-CM | POA: Diagnosis not present

## 2020-06-11 DIAGNOSIS — D649 Anemia, unspecified: Secondary | ICD-10-CM | POA: Diagnosis not present

## 2020-06-11 DIAGNOSIS — Z48812 Encounter for surgical aftercare following surgery on the circulatory system: Secondary | ICD-10-CM | POA: Diagnosis not present

## 2020-06-11 DIAGNOSIS — G250 Essential tremor: Secondary | ICD-10-CM | POA: Diagnosis not present

## 2020-06-11 DIAGNOSIS — F32A Depression, unspecified: Secondary | ICD-10-CM | POA: Diagnosis not present

## 2020-06-11 DIAGNOSIS — G43909 Migraine, unspecified, not intractable, without status migrainosus: Secondary | ICD-10-CM | POA: Diagnosis not present

## 2020-06-11 DIAGNOSIS — G40909 Epilepsy, unspecified, not intractable, without status epilepticus: Secondary | ICD-10-CM | POA: Diagnosis not present

## 2020-06-11 DIAGNOSIS — I739 Peripheral vascular disease, unspecified: Secondary | ICD-10-CM | POA: Diagnosis not present

## 2020-06-11 DIAGNOSIS — E785 Hyperlipidemia, unspecified: Secondary | ICD-10-CM | POA: Diagnosis not present

## 2020-06-12 ENCOUNTER — Encounter: Payer: Self-pay | Admitting: Vascular Surgery

## 2020-06-12 ENCOUNTER — Ambulatory Visit (INDEPENDENT_AMBULATORY_CARE_PROVIDER_SITE_OTHER): Payer: Medicare HMO | Admitting: Vascular Surgery

## 2020-06-12 ENCOUNTER — Encounter (HOSPITAL_COMMUNITY): Payer: Medicare HMO

## 2020-06-12 ENCOUNTER — Ambulatory Visit (HOSPITAL_COMMUNITY)
Admission: RE | Admit: 2020-06-12 | Discharge: 2020-06-12 | Disposition: A | Discharge status: Home / Self Care | Payer: Medicare HMO | Source: Ambulatory Visit | Attending: Vascular Surgery | Admitting: Vascular Surgery

## 2020-06-12 ENCOUNTER — Other Ambulatory Visit: Payer: Self-pay

## 2020-06-12 VITALS — BP 90/66 | HR 83 | Temp 97.2°F | Resp 20 | Ht 62.0 in | Wt 122.0 lb

## 2020-06-12 DIAGNOSIS — Z09 Encounter for follow-up examination after completed treatment for conditions other than malignant neoplasm: Secondary | ICD-10-CM

## 2020-06-12 DIAGNOSIS — I724 Aneurysm of artery of lower extremity: Secondary | ICD-10-CM | POA: Diagnosis not present

## 2020-06-12 NOTE — Progress Notes (Signed)
   ASSESSMENT & PLAN:  Lindsay Erickson is a 60 y.o. 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 She has healed well. Her distal arterial flow is normal. Follow up with me as needed.  SUBJECTIVE:  No complaints. Taking care of groin well - daily dressing changes with clean gauze. No complaints from a surgical perspective. Walking as much as she likes.  OBJECTIVE:  BP 90/66 (BP Location: Left Arm, Patient Position: Sitting, Cuff Size: Normal)   Pulse 83   Temp (!) 97.2 F (36.2 C)   Resp 20   Ht 5\' 2"  (1.575 m)   Wt 122 lb (55.3 kg)   SpO2 99%   BMI 22.31 kg/m   Right groin healed.  CBC Latest Ref Rng & Units 05/15/2020 05/15/2020 05/14/2020  WBC 4.0 - 10.5 K/uL - 4.6 5.4  Hemoglobin 12.0 - 15.0 g/dL 10.1(L) 6.2(LL) 11.6(L)  Hematocrit 36.0 - 46.0 % 29.9(L) 18.1(L) 36.2  Platelets 150 - 400 K/uL - 171 261     CMP Latest Ref Rng & Units 05/15/2020 05/14/2020 04/11/2020  Glucose 70 - 99 mg/dL 142(H) 113(H) 109(H)  BUN 8 - 23 mg/dL 11 11 26(H)  Creatinine 0.44 - 1.00 mg/dL 0.54 0.63 0.69  Sodium 135 - 145 mmol/L 137 139 139  Potassium 3.5 - 5.1 mmol/L 4.0 3.6 3.4(L)  Chloride 98 - 111 mmol/L 111 102 102  CO2 22 - 32 mmol/L 19(L) 25 24  Calcium 8.9 - 10.3 mg/dL 7.1(L) 9.3 9.4  Total Protein 6.0 - 8.5 g/dL - - -  Total Bilirubin 0.0 - 1.2 mg/dL - - -  Alkaline Phos 39 - 117 IU/L - - -  AST 0 - 40 IU/L - - -  ALT 0 - 32 IU/L - - -    CrCl cannot be calculated (Patient's most recent lab result is older than the maximum 21 days allowed.).  ABI 06/12/20   Yevonne Aline. Stanford Breed, MD [facility] Phone Number: [phone] 06/12/2020 1:41 PM

## 2020-06-13 DIAGNOSIS — F32A Depression, unspecified: Secondary | ICD-10-CM | POA: Diagnosis not present

## 2020-06-13 DIAGNOSIS — G40909 Epilepsy, unspecified, not intractable, without status epilepticus: Secondary | ICD-10-CM | POA: Diagnosis not present

## 2020-06-13 DIAGNOSIS — G43909 Migraine, unspecified, not intractable, without status migrainosus: Secondary | ICD-10-CM | POA: Diagnosis not present

## 2020-06-13 DIAGNOSIS — Z48812 Encounter for surgical aftercare following surgery on the circulatory system: Secondary | ICD-10-CM | POA: Diagnosis not present

## 2020-06-13 DIAGNOSIS — E785 Hyperlipidemia, unspecified: Secondary | ICD-10-CM | POA: Diagnosis not present

## 2020-06-13 DIAGNOSIS — D649 Anemia, unspecified: Secondary | ICD-10-CM | POA: Diagnosis not present

## 2020-06-13 DIAGNOSIS — I739 Peripheral vascular disease, unspecified: Secondary | ICD-10-CM | POA: Diagnosis not present

## 2020-06-13 DIAGNOSIS — F419 Anxiety disorder, unspecified: Secondary | ICD-10-CM | POA: Diagnosis not present

## 2020-06-13 DIAGNOSIS — G250 Essential tremor: Secondary | ICD-10-CM | POA: Diagnosis not present

## 2020-06-14 DIAGNOSIS — T819XXA Unspecified complication of procedure, initial encounter: Secondary | ICD-10-CM | POA: Diagnosis not present

## 2020-06-14 DIAGNOSIS — Z6821 Body mass index (BMI) 21.0-21.9, adult: Secondary | ICD-10-CM | POA: Diagnosis not present

## 2020-06-14 DIAGNOSIS — R011 Cardiac murmur, unspecified: Secondary | ICD-10-CM | POA: Diagnosis not present

## 2020-06-14 DIAGNOSIS — N3001 Acute cystitis with hematuria: Secondary | ICD-10-CM | POA: Diagnosis not present

## 2020-06-18 DIAGNOSIS — Z48812 Encounter for surgical aftercare following surgery on the circulatory system: Secondary | ICD-10-CM | POA: Diagnosis not present

## 2020-06-18 DIAGNOSIS — G250 Essential tremor: Secondary | ICD-10-CM | POA: Diagnosis not present

## 2020-06-18 DIAGNOSIS — G40909 Epilepsy, unspecified, not intractable, without status epilepticus: Secondary | ICD-10-CM | POA: Diagnosis not present

## 2020-06-18 DIAGNOSIS — I739 Peripheral vascular disease, unspecified: Secondary | ICD-10-CM | POA: Diagnosis not present

## 2020-06-18 DIAGNOSIS — G43909 Migraine, unspecified, not intractable, without status migrainosus: Secondary | ICD-10-CM | POA: Diagnosis not present

## 2020-06-18 DIAGNOSIS — D649 Anemia, unspecified: Secondary | ICD-10-CM | POA: Diagnosis not present

## 2020-06-18 DIAGNOSIS — E785 Hyperlipidemia, unspecified: Secondary | ICD-10-CM | POA: Diagnosis not present

## 2020-06-18 DIAGNOSIS — F32A Depression, unspecified: Secondary | ICD-10-CM | POA: Diagnosis not present

## 2020-06-18 DIAGNOSIS — F419 Anxiety disorder, unspecified: Secondary | ICD-10-CM | POA: Diagnosis not present

## 2020-06-19 DIAGNOSIS — Z48812 Encounter for surgical aftercare following surgery on the circulatory system: Secondary | ICD-10-CM | POA: Diagnosis not present

## 2020-06-19 DIAGNOSIS — I739 Peripheral vascular disease, unspecified: Secondary | ICD-10-CM | POA: Diagnosis not present

## 2020-06-19 DIAGNOSIS — G250 Essential tremor: Secondary | ICD-10-CM | POA: Diagnosis not present

## 2020-06-19 DIAGNOSIS — D649 Anemia, unspecified: Secondary | ICD-10-CM | POA: Diagnosis not present

## 2020-06-19 DIAGNOSIS — F32A Depression, unspecified: Secondary | ICD-10-CM | POA: Diagnosis not present

## 2020-06-19 DIAGNOSIS — E785 Hyperlipidemia, unspecified: Secondary | ICD-10-CM | POA: Diagnosis not present

## 2020-06-19 DIAGNOSIS — F419 Anxiety disorder, unspecified: Secondary | ICD-10-CM | POA: Diagnosis not present

## 2020-06-19 DIAGNOSIS — G43909 Migraine, unspecified, not intractable, without status migrainosus: Secondary | ICD-10-CM | POA: Diagnosis not present

## 2020-06-19 DIAGNOSIS — G40909 Epilepsy, unspecified, not intractable, without status epilepticus: Secondary | ICD-10-CM | POA: Diagnosis not present

## 2020-06-20 DIAGNOSIS — Z48812 Encounter for surgical aftercare following surgery on the circulatory system: Secondary | ICD-10-CM | POA: Diagnosis not present

## 2020-06-20 DIAGNOSIS — E785 Hyperlipidemia, unspecified: Secondary | ICD-10-CM | POA: Diagnosis not present

## 2020-06-20 DIAGNOSIS — I739 Peripheral vascular disease, unspecified: Secondary | ICD-10-CM | POA: Diagnosis not present

## 2020-06-20 DIAGNOSIS — G43909 Migraine, unspecified, not intractable, without status migrainosus: Secondary | ICD-10-CM | POA: Diagnosis not present

## 2020-06-20 DIAGNOSIS — G250 Essential tremor: Secondary | ICD-10-CM | POA: Diagnosis not present

## 2020-06-20 DIAGNOSIS — D649 Anemia, unspecified: Secondary | ICD-10-CM | POA: Diagnosis not present

## 2020-06-20 DIAGNOSIS — F419 Anxiety disorder, unspecified: Secondary | ICD-10-CM | POA: Diagnosis not present

## 2020-06-20 DIAGNOSIS — F32A Depression, unspecified: Secondary | ICD-10-CM | POA: Diagnosis not present

## 2020-06-20 DIAGNOSIS — G40909 Epilepsy, unspecified, not intractable, without status epilepticus: Secondary | ICD-10-CM | POA: Diagnosis not present

## 2020-06-25 DIAGNOSIS — G40909 Epilepsy, unspecified, not intractable, without status epilepticus: Secondary | ICD-10-CM | POA: Diagnosis not present

## 2020-06-25 DIAGNOSIS — F32A Depression, unspecified: Secondary | ICD-10-CM | POA: Diagnosis not present

## 2020-06-25 DIAGNOSIS — G43909 Migraine, unspecified, not intractable, without status migrainosus: Secondary | ICD-10-CM | POA: Diagnosis not present

## 2020-06-25 DIAGNOSIS — D649 Anemia, unspecified: Secondary | ICD-10-CM | POA: Diagnosis not present

## 2020-06-25 DIAGNOSIS — Z48812 Encounter for surgical aftercare following surgery on the circulatory system: Secondary | ICD-10-CM | POA: Diagnosis not present

## 2020-06-25 DIAGNOSIS — G250 Essential tremor: Secondary | ICD-10-CM | POA: Diagnosis not present

## 2020-06-25 DIAGNOSIS — I739 Peripheral vascular disease, unspecified: Secondary | ICD-10-CM | POA: Diagnosis not present

## 2020-06-25 DIAGNOSIS — E785 Hyperlipidemia, unspecified: Secondary | ICD-10-CM | POA: Diagnosis not present

## 2020-06-25 DIAGNOSIS — F419 Anxiety disorder, unspecified: Secondary | ICD-10-CM | POA: Diagnosis not present

## 2020-06-26 ENCOUNTER — Encounter: Payer: Self-pay | Admitting: Cardiology

## 2020-06-26 ENCOUNTER — Encounter: Payer: Self-pay | Admitting: *Deleted

## 2020-06-26 DIAGNOSIS — D649 Anemia, unspecified: Secondary | ICD-10-CM | POA: Insufficient documentation

## 2020-06-26 DIAGNOSIS — E785 Hyperlipidemia, unspecified: Secondary | ICD-10-CM | POA: Diagnosis not present

## 2020-06-26 DIAGNOSIS — Z48812 Encounter for surgical aftercare following surgery on the circulatory system: Secondary | ICD-10-CM | POA: Diagnosis not present

## 2020-06-26 DIAGNOSIS — J189 Pneumonia, unspecified organism: Secondary | ICD-10-CM | POA: Insufficient documentation

## 2020-06-26 DIAGNOSIS — F419 Anxiety disorder, unspecified: Secondary | ICD-10-CM | POA: Diagnosis not present

## 2020-06-26 DIAGNOSIS — G43909 Migraine, unspecified, not intractable, without status migrainosus: Secondary | ICD-10-CM | POA: Diagnosis not present

## 2020-06-26 DIAGNOSIS — Z87442 Personal history of urinary calculi: Secondary | ICD-10-CM | POA: Insufficient documentation

## 2020-06-26 DIAGNOSIS — G250 Essential tremor: Secondary | ICD-10-CM | POA: Insufficient documentation

## 2020-06-26 DIAGNOSIS — F32A Depression, unspecified: Secondary | ICD-10-CM | POA: Insufficient documentation

## 2020-06-26 DIAGNOSIS — I739 Peripheral vascular disease, unspecified: Secondary | ICD-10-CM | POA: Diagnosis not present

## 2020-06-26 DIAGNOSIS — G40909 Epilepsy, unspecified, not intractable, without status epilepticus: Secondary | ICD-10-CM | POA: Diagnosis not present

## 2020-06-26 DIAGNOSIS — K219 Gastro-esophageal reflux disease without esophagitis: Secondary | ICD-10-CM | POA: Insufficient documentation

## 2020-06-27 DIAGNOSIS — F419 Anxiety disorder, unspecified: Secondary | ICD-10-CM | POA: Diagnosis not present

## 2020-06-27 DIAGNOSIS — Z48812 Encounter for surgical aftercare following surgery on the circulatory system: Secondary | ICD-10-CM | POA: Diagnosis not present

## 2020-06-27 DIAGNOSIS — F32A Depression, unspecified: Secondary | ICD-10-CM | POA: Diagnosis not present

## 2020-06-27 DIAGNOSIS — I739 Peripheral vascular disease, unspecified: Secondary | ICD-10-CM | POA: Diagnosis not present

## 2020-06-27 DIAGNOSIS — G40909 Epilepsy, unspecified, not intractable, without status epilepticus: Secondary | ICD-10-CM | POA: Diagnosis not present

## 2020-06-27 DIAGNOSIS — G43909 Migraine, unspecified, not intractable, without status migrainosus: Secondary | ICD-10-CM | POA: Diagnosis not present

## 2020-06-27 DIAGNOSIS — G250 Essential tremor: Secondary | ICD-10-CM | POA: Diagnosis not present

## 2020-06-27 DIAGNOSIS — E785 Hyperlipidemia, unspecified: Secondary | ICD-10-CM | POA: Diagnosis not present

## 2020-06-27 DIAGNOSIS — D649 Anemia, unspecified: Secondary | ICD-10-CM | POA: Diagnosis not present

## 2020-07-02 DIAGNOSIS — G250 Essential tremor: Secondary | ICD-10-CM | POA: Diagnosis not present

## 2020-07-02 DIAGNOSIS — D649 Anemia, unspecified: Secondary | ICD-10-CM | POA: Diagnosis not present

## 2020-07-02 DIAGNOSIS — G40909 Epilepsy, unspecified, not intractable, without status epilepticus: Secondary | ICD-10-CM | POA: Diagnosis not present

## 2020-07-02 DIAGNOSIS — I739 Peripheral vascular disease, unspecified: Secondary | ICD-10-CM | POA: Diagnosis not present

## 2020-07-02 DIAGNOSIS — F419 Anxiety disorder, unspecified: Secondary | ICD-10-CM | POA: Diagnosis not present

## 2020-07-02 DIAGNOSIS — Z48812 Encounter for surgical aftercare following surgery on the circulatory system: Secondary | ICD-10-CM | POA: Diagnosis not present

## 2020-07-02 DIAGNOSIS — G43909 Migraine, unspecified, not intractable, without status migrainosus: Secondary | ICD-10-CM | POA: Diagnosis not present

## 2020-07-02 DIAGNOSIS — E785 Hyperlipidemia, unspecified: Secondary | ICD-10-CM | POA: Diagnosis not present

## 2020-07-02 DIAGNOSIS — F32A Depression, unspecified: Secondary | ICD-10-CM | POA: Diagnosis not present

## 2020-07-03 DIAGNOSIS — Z48812 Encounter for surgical aftercare following surgery on the circulatory system: Secondary | ICD-10-CM | POA: Diagnosis not present

## 2020-07-03 DIAGNOSIS — F419 Anxiety disorder, unspecified: Secondary | ICD-10-CM | POA: Diagnosis not present

## 2020-07-03 DIAGNOSIS — D649 Anemia, unspecified: Secondary | ICD-10-CM | POA: Diagnosis not present

## 2020-07-03 DIAGNOSIS — G43909 Migraine, unspecified, not intractable, without status migrainosus: Secondary | ICD-10-CM | POA: Diagnosis not present

## 2020-07-03 DIAGNOSIS — F32A Depression, unspecified: Secondary | ICD-10-CM | POA: Diagnosis not present

## 2020-07-03 DIAGNOSIS — E785 Hyperlipidemia, unspecified: Secondary | ICD-10-CM | POA: Diagnosis not present

## 2020-07-03 DIAGNOSIS — G250 Essential tremor: Secondary | ICD-10-CM | POA: Diagnosis not present

## 2020-07-03 DIAGNOSIS — G40909 Epilepsy, unspecified, not intractable, without status epilepticus: Secondary | ICD-10-CM | POA: Diagnosis not present

## 2020-07-03 DIAGNOSIS — I739 Peripheral vascular disease, unspecified: Secondary | ICD-10-CM | POA: Diagnosis not present

## 2020-07-04 DIAGNOSIS — G40909 Epilepsy, unspecified, not intractable, without status epilepticus: Secondary | ICD-10-CM | POA: Diagnosis not present

## 2020-07-04 DIAGNOSIS — D649 Anemia, unspecified: Secondary | ICD-10-CM | POA: Diagnosis not present

## 2020-07-04 DIAGNOSIS — F419 Anxiety disorder, unspecified: Secondary | ICD-10-CM | POA: Diagnosis not present

## 2020-07-04 DIAGNOSIS — I739 Peripheral vascular disease, unspecified: Secondary | ICD-10-CM | POA: Diagnosis not present

## 2020-07-04 DIAGNOSIS — E785 Hyperlipidemia, unspecified: Secondary | ICD-10-CM | POA: Diagnosis not present

## 2020-07-04 DIAGNOSIS — Z48812 Encounter for surgical aftercare following surgery on the circulatory system: Secondary | ICD-10-CM | POA: Diagnosis not present

## 2020-07-04 DIAGNOSIS — G250 Essential tremor: Secondary | ICD-10-CM | POA: Diagnosis not present

## 2020-07-04 DIAGNOSIS — G43909 Migraine, unspecified, not intractable, without status migrainosus: Secondary | ICD-10-CM | POA: Diagnosis not present

## 2020-07-04 DIAGNOSIS — F32A Depression, unspecified: Secondary | ICD-10-CM | POA: Diagnosis not present

## 2020-07-05 DIAGNOSIS — G40909 Epilepsy, unspecified, not intractable, without status epilepticus: Secondary | ICD-10-CM | POA: Diagnosis not present

## 2020-07-05 DIAGNOSIS — G250 Essential tremor: Secondary | ICD-10-CM | POA: Diagnosis not present

## 2020-07-05 DIAGNOSIS — F419 Anxiety disorder, unspecified: Secondary | ICD-10-CM | POA: Diagnosis not present

## 2020-07-05 DIAGNOSIS — I720 Aneurysm of carotid artery: Secondary | ICD-10-CM | POA: Diagnosis not present

## 2020-07-05 DIAGNOSIS — N3001 Acute cystitis with hematuria: Secondary | ICD-10-CM | POA: Diagnosis not present

## 2020-07-05 DIAGNOSIS — Z6821 Body mass index (BMI) 21.0-21.9, adult: Secondary | ICD-10-CM | POA: Diagnosis not present

## 2020-07-05 DIAGNOSIS — R011 Cardiac murmur, unspecified: Secondary | ICD-10-CM | POA: Diagnosis not present

## 2020-07-05 DIAGNOSIS — E785 Hyperlipidemia, unspecified: Secondary | ICD-10-CM | POA: Diagnosis not present

## 2020-07-10 DIAGNOSIS — E785 Hyperlipidemia, unspecified: Secondary | ICD-10-CM | POA: Diagnosis not present

## 2020-07-10 DIAGNOSIS — R112 Nausea with vomiting, unspecified: Secondary | ICD-10-CM | POA: Insufficient documentation

## 2020-07-10 DIAGNOSIS — G250 Essential tremor: Secondary | ICD-10-CM | POA: Diagnosis not present

## 2020-07-10 DIAGNOSIS — G43909 Migraine, unspecified, not intractable, without status migrainosus: Secondary | ICD-10-CM | POA: Diagnosis not present

## 2020-07-10 DIAGNOSIS — D649 Anemia, unspecified: Secondary | ICD-10-CM | POA: Diagnosis not present

## 2020-07-10 DIAGNOSIS — F32A Depression, unspecified: Secondary | ICD-10-CM | POA: Diagnosis not present

## 2020-07-10 DIAGNOSIS — Z48812 Encounter for surgical aftercare following surgery on the circulatory system: Secondary | ICD-10-CM | POA: Diagnosis not present

## 2020-07-10 DIAGNOSIS — F419 Anxiety disorder, unspecified: Secondary | ICD-10-CM | POA: Diagnosis not present

## 2020-07-10 DIAGNOSIS — I739 Peripheral vascular disease, unspecified: Secondary | ICD-10-CM | POA: Diagnosis not present

## 2020-07-10 DIAGNOSIS — G40909 Epilepsy, unspecified, not intractable, without status epilepticus: Secondary | ICD-10-CM | POA: Diagnosis not present

## 2020-07-10 DIAGNOSIS — K5900 Constipation, unspecified: Secondary | ICD-10-CM | POA: Insufficient documentation

## 2020-07-10 DIAGNOSIS — Z9889 Other specified postprocedural states: Secondary | ICD-10-CM | POA: Insufficient documentation

## 2020-07-11 ENCOUNTER — Encounter: Payer: Self-pay | Admitting: Cardiology

## 2020-07-11 ENCOUNTER — Other Ambulatory Visit: Payer: Self-pay

## 2020-07-11 ENCOUNTER — Ambulatory Visit: Payer: Medicare HMO | Admitting: Cardiology

## 2020-07-11 VITALS — BP 114/66 | HR 100 | Ht 62.6 in | Wt 119.6 lb

## 2020-07-11 DIAGNOSIS — I671 Cerebral aneurysm, nonruptured: Secondary | ICD-10-CM | POA: Diagnosis not present

## 2020-07-11 DIAGNOSIS — R0789 Other chest pain: Secondary | ICD-10-CM

## 2020-07-11 DIAGNOSIS — E782 Mixed hyperlipidemia: Secondary | ICD-10-CM | POA: Diagnosis not present

## 2020-07-11 DIAGNOSIS — R011 Cardiac murmur, unspecified: Secondary | ICD-10-CM

## 2020-07-11 HISTORY — DX: Cardiac murmur, unspecified: R01.1

## 2020-07-11 NOTE — Progress Notes (Signed)
Cardiology Office Note:    Date:  07/11/2020   ID:  Lindsay Erickson, DOB 09-Apr-1961, MRN [id number]  PCP:  Serita Grammes, MD  Cardiologist:  Jenean Lindau, MD   Referring MD: Serita Grammes, MD    ASSESSMENT:    1. Murmur   2. Mixed dyslipidemia   3. Chest discomfort   4. Brain aneurysm   5. Cardiac murmur    PLAN:    In order of problems listed above:  1. Primary prevention stressed with the patient.  Importance of compliance with diet medication stressed and she vocalized understanding. 2. Mixed dyslipidemia: Diet was emphasized.  Lipids followed by primary care physician.  I reviewed numbers available to me. 3. I reviewed records about her having her brain aneurysm intervention.  She is at significant blood loss and anemia and followed by primary care for this.  She has received transfusion for this. 4. Cardiac murmur: Echocardiogram will be done to assess murmur heard on auscultation. 5. Patient will be seen in follow-up appointment in 6 months or earlier if the patient has any concerns    Medication Adjustments/Labs and Tests Ordered: Current medicines are reviewed at length with the patient today.  Concerns regarding medicines are outlined above.  Orders Placed This Encounter  Procedures  . EKG 12-Lead  . ECHOCARDIOGRAM COMPLETE   No orders of the defined types were placed in this encounter.    No chief complaint on file.    History of Present Illness:    Lindsay Erickson is a 60 y.o. female.  Patient has past medical history of mixed dyslipidemia.  She underwent brain aneurysm treatment percutaneously.  Subsequently she appears to have developed a pseudoaneurysm for which he needed vascular surgery.  She became significantly anemic with this.  This is followed by interventional radiologist and primary care provider.  She denies any chest pain orthopnea or PND.  She is referred here for cardiac murmur.  At the time of my evaluation, the patient is  alert awake oriented and in no distress.  Past Medical History:  Diagnosis Date  . Abnormal involuntary movement 07/26/2013  . Acid reflux   . Anemia   . Anxiety   . 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 Benign familial tremor 06/17/2017  . Brain aneurysm 05/14/2020  . Chest discomfort 06/22/2018  . Chronic tension-type headache, not intractable 05/10/2014  . Constipation     05/11/20- improved  . Depression   . Diplopia 09/30/2017  . Dyslipidemia   . Essential tremor   . History of kidney stones   . Memory loss   . Migraine without aura and without status migrainosus, not intractable 06/17/2017  . Mixed dyslipidemia 06/22/2018  . Partial epilepsy with impairment of consciousness ([place]) 07/26/2013  . Peripheral vascular disease ([place])    blood clot in leg after being kicked by a horse - age 36  . Pneumonia   . PONV (postoperative nausea and vomiting)    after breast biopsy and headache.  . Seizure disorder ([place])   . Tremor, essential     Past Surgical History:  Procedure Laterality Date  . ABDOMINAL HYSTERECTOMY    . APPLICATION OF WOUND VAC Right 05/14/2020   Procedure: APPLICATION OF WOUND VAC;  Surgeon: Cherre Robins, MD;  Location: [place];  Service: Vascular;  Laterality: Right;  . BREAST BIOPSY Left    X3  . CARPAL TUNNEL RELEASE    . CATARACT EXTRACTION Bilateral   . FALSE ANEURYSM REPAIR Right 05/14/2020  Procedure: REPAIR FALSE ANEURYSM;  Surgeon: Cherre Robins, MD;  Location: [facility] OR;  Service: Vascular;  Laterality: Right;  . IR ANGIO INTRA EXTRACRAN SEL COM CAROTID INNOMINATE BILAT MOD SED  01/12/2020  . IR ANGIO INTRA EXTRACRAN SEL INTERNAL CAROTID UNI L MOD SED  05/14/2020  . IR ANGIO VERTEBRAL SEL VERTEBRAL BILAT MOD SED  01/12/2020  . IR ANGIOGRAM FOLLOW UP STUDY  05/14/2020  . IR ANGIOGRAM FOLLOW UP STUDY  05/14/2020  . IR RADIOLOGIST EVAL & MGMT  06/06/2020  . IR TRANSCATH/EMBOLIZ  05/14/2020  . IR US GUIDE VASC ACCESS RIGHT  01/12/2020  . RADIOLOGY WITH ANESTHESIA N/A  05/14/2020   Procedure: IR WITH ANESTHESIA ANEURYSM EMBOLIZATION;  Surgeon: [name], MD;  Location: [facility];  Service: Radiology;  Laterality: N/A;  . SALPINGOOPHORECTOMY     both ovaries removed    Current Medications: Current Meds  Medication Sig  . acetaminophen (TYLENOL) 500 MG tablet Take 500 mg by mouth every 8 (eight) hours as needed for moderate pain.  Marland Kitchen aspirin EC 81 MG tablet Take 81 mg by mouth daily. Swallow whole.  . clopidogrel (PLAVIX) 75 MG tablet Take 37.5 mg by mouth every other day.  . divalproex (DEPAKOTE ER) 500 MG 24 hr tablet Take 500 mg by mouth every evening.  . rosuvastatin (CRESTOR) 10 MG tablet Take 10 mg by mouth at bedtime.     Allergies:   Gabapentin, Primidone, Propranolol, Venlafaxine, Latex, Nickel, and Codeine   Social History   Socioeconomic History  . Marital status: Married    Spouse name: Not on file  . Number of children: Not on file  . Years of education: Not on file  . Highest education level: Not on file  Occupational History  . Not on file  Tobacco Use  . Smoking status: Current Every Day Smoker    Packs/day: 0.25    Types: Cigarettes  . Smokeless tobacco: Never Used  Vaping Use  . Vaping Use: Never used  Substance and Sexual Activity  . Alcohol use: Yes    Comment: Rare, special occasion  . Drug use: Never  . Sexual activity: Not on file  Other Topics Concern  . Not on file  Social History Narrative   One story home mobile home   Right handed   Drinks caffeine on occasion   Social Determinants of Health   Financial Resource Strain: Not on file  Food Insecurity: Not on file  Transportation Needs: Not on file  Physical Activity: Not on file  Stress: Not on file  Social Connections: Not on file     Family History: The patient's family history includes Atrial fibrillation in her sister; Diabetes in her brother and maternal grandmother; Heart attack in her maternal uncle and maternal uncle; Heart disease in her  father and mother; Stroke in her mother; Tremor in her father and mother.  ROS:   Please see the history of present illness.    All other systems reviewed and are negative.  EKGs/Labs/Other Studies Reviewed:    The following studies were reviewed today: I discussed my findings with the patient at length.  EKG reveals sinus rhythm and nonspecific ST-T changes   Recent Labs: 05/15/2020: BUN 11; Creatinine, Ser 0.54; Hemoglobin 10.1; Platelets 171; Potassium 4.0; Sodium 137  Recent Lipid Panel    Component Value Date/Time   CHOL 151 02/25/2019 0950   TRIG 118 02/25/2019 0950   HDL 52 02/25/2019 0950   CHOLHDL 2.9 02/25/2019 0950  [place] 78 02/25/2019 0950    Physical Exam:    VS:  BP 114/66   Pulse 100   Ht 5' 2.6" (1.59 m)   Wt 119 lb 9.6 oz (54.3 kg)   SpO2 99%   BMI 21.46 kg/m     Wt Readings from Last 3 Encounters:  07/11/20 119 lb 9.6 oz (54.3 kg)  05/30/20 122 lb (55.3 kg)  06/12/20 122 lb (55.3 kg)     GEN: Patient is in no acute distress HEENT: Normal NECK: No JVD; No carotid bruits LYMPHATICS: No lymphadenopathy CARDIAC: Hear sounds regular, 2/6 systolic murmur at the apex. RESPIRATORY:  Clear to auscultation without rales, wheezing or rhonchi  ABDOMEN: Soft, non-tender, non-distended MUSCULOSKELETAL:  No edema; No deformity  SKIN: Warm and dry NEUROLOGIC:  Alert and oriented x 3 PSYCHIATRIC:  Normal affect   Signed, Jenean Lindau, MD  07/11/2020 9:56 AM    [place]

## 2020-07-11 NOTE — Patient Instructions (Signed)
Medication Instructions:  No medication changes. *If you need a refill on your cardiac medications before your next appointment, please call your pharmacy*   Lab Work: None ordered If you have labs (blood work) drawn today and your tests are completely normal, you will receive your results only by: Marland Kitchen MyChart Message (if you have MyChart) OR . A paper copy in the mail If you have any lab test that is abnormal or we need to change your treatment, we will call you to review the results.   Testing/Procedures: Your physician has requested that you have an echocardiogram. Echocardiography is a painless test that uses sound waves to create images of your heart. It provides your doctor with information about the size and shape of your heart and how well your heart's chambers and valves are working. This procedure takes approximately one hour. There are no restrictions for this procedure.     Follow-Up: At [facility], you and your health needs are our priority.  As part of our continuing mission to provide you with exceptional heart care, we have created designated Provider Care Teams.  These Care Teams include your primary Cardiologist (physician) and Advanced Practice Providers (APPs -  Physician Assistants and Nurse Practitioners) who all work together to provide you with the care you need, when you need it.  We recommend signing up for the patient portal called "MyChart".  Sign up information is provided on this After Visit Summary.  MyChart is used to connect with patients for Virtual Visits (Telemedicine).  Patients are able to view lab/test results, encounter notes, upcoming appointments, etc.  Non-urgent messages can be sent to your provider as well.   To learn more about what you can do with MyChart, go to [email or web address].    Your next appointment:   6 month(s)  The format for your next appointment:   In Person  Provider:   [name], MD   Other  Instructions  Echocardiogram An echocardiogram is a test that uses sound waves (ultrasound) to produce images of the heart. Images from an echocardiogram can provide important information about:  Heart size and shape.  The size and thickness and movement of your heart's walls.  Heart muscle function and strength.  Heart valve function or if you have stenosis. Stenosis is when the heart valves are too narrow.  If blood is flowing backward through the heart valves (regurgitation).  A tumor or infectious growth around the heart valves.  Areas of heart muscle that are not working well because of poor blood flow or injury from a heart attack.  Aneurysm detection. An aneurysm is a weak or damaged part of an artery wall. The wall bulges out from the normal force of blood pumping through the body. Tell a health care provider about:  Any allergies you have.  All medicines you are taking, including vitamins, herbs, eye drops, creams, and over-the-counter medicines.  Any blood disorders you have.  Any surgeries you have had.  Any medical conditions you have.  Whether you are pregnant or may be pregnant. What are the risks? Generally, this is a safe test. However, problems may occur, including an allergic reaction to dye (contrast) that may be used during the test. What happens before the test? No specific preparation is needed. You may eat and drink normally. What happens during the test?  You will take off your clothes from the waist up and put on a hospital gown.  Electrodes or electrocardiogram (ECG)patches may be placed on your  chest. The electrodes or patches are then connected to a device that monitors your heart rate and rhythm.  You will lie down on a table for an ultrasound exam. A gel will be applied to your chest to help sound waves pass through your skin.  A handheld device, called a transducer, will be pressed against your chest and moved over your heart. The  transducer produces sound waves that travel to your heart and bounce back (or "echo" back) to the transducer. These sound waves will be captured in real-time and changed into images of your heart that can be viewed on a video monitor. The images will be recorded on a computer and reviewed by your health care provider.  You may be asked to change positions or hold your breath for a short time. This makes it easier to get different views or better views of your heart.  In some cases, you may receive contrast through an IV in one of your veins. This can improve the quality of the pictures from your heart. The procedure may vary among health care providers and hospitals.   What can I expect after the test? You may return to your normal, everyday life, including diet, activities, and medicines, unless your health care provider tells you not to do that. Follow these instructions at home:  It is up to you to get the results of your test. Ask your health care provider, or the department that is doing the test, when your results will be ready.  Keep all follow-up visits. This is important. Summary  An echocardiogram is a test that uses sound waves (ultrasound) to produce images of the heart.  Images from an echocardiogram can provide important information about the size and shape of your heart, heart muscle function, heart valve function, and other possible heart problems.  You do not need to do anything to prepare before this test. You may eat and drink normally.  After the echocardiogram is completed, you may return to your normal, everyday life, unless your health care provider tells you not to do that. This information is not intended to replace advice given to you by your health care provider. Make sure you discuss any questions you have with your health care provider. Document Revised: 12/13/2019 Document Reviewed: 12/13/2019 Elsevier Patient Education  2021 [employer].  Echocardiogram An  echocardiogram is a test that uses sound waves (ultrasound) to produce images of the heart. Images from an echocardiogram can provide important information about:  Heart size and shape.  The size and thickness and movement of your heart's walls.  Heart muscle function and strength.  Heart valve function or if you have stenosis. Stenosis is when the heart valves are too narrow.  If blood is flowing backward through the heart valves (regurgitation).  A tumor or infectious growth around the heart valves.  Areas of heart muscle that are not working well because of poor blood flow or injury from a heart attack.  Aneurysm detection. An aneurysm is a weak or damaged part of an artery wall. The wall bulges out from the normal force of blood pumping through the body. Tell a health care provider about:  Any allergies you have.  All medicines you are taking, including vitamins, herbs, eye drops, creams, and over-the-counter medicines.  Any blood disorders you have.  Any surgeries you have had.  Any medical conditions you have.  Whether you are pregnant or may be pregnant. What are the risks? Generally, this is a safe  test. However, problems may occur, including an allergic reaction to dye (contrast) that may be used during the test. What happens before the test? No specific preparation is needed. You may eat and drink normally. What happens during the test?  You will take off your clothes from the waist up and put on a hospital gown.  Electrodes or electrocardiogram (ECG)patches may be placed on your chest. The electrodes or patches are then connected to a device that monitors your heart rate and rhythm.  You will lie down on a table for an ultrasound exam. A gel will be applied to your chest to help sound waves pass through your skin.  A handheld device, called a transducer, will be pressed against your chest and moved over your heart. The transducer produces sound waves that travel  to your heart and bounce back (or "echo" back) to the transducer. These sound waves will be captured in real-time and changed into images of your heart that can be viewed on a video monitor. The images will be recorded on a computer and reviewed by your health care provider.  You may be asked to change positions or hold your breath for a short time. This makes it easier to get different views or better views of your heart.  In some cases, you may receive contrast through an IV in one of your veins. This can improve the quality of the pictures from your heart. The procedure may vary among health care providers and hospitals.   What can I expect after the test? You may return to your normal, everyday life, including diet, activities, and medicines, unless your health care provider tells you not to do that. Follow these instructions at home:  It is up to you to get the results of your test. Ask your health care provider, or the department that is doing the test, when your results will be ready.  Keep all follow-up visits. This is important. Summary  An echocardiogram is a test that uses sound waves (ultrasound) to produce images of the heart.  Images from an echocardiogram can provide important information about the size and shape of your heart, heart muscle function, heart valve function, and other possible heart problems.  You do not need to do anything to prepare before this test. You may eat and drink normally.  After the echocardiogram is completed, you may return to your normal, everyday life, unless your health care provider tells you not to do that. This information is not intended to replace advice given to you by your health care provider. Make sure you discuss any questions you have with your health care provider. Document Revised: 12/13/2019 Document Reviewed: 12/13/2019 Elsevier Patient Education  2021 [employer].

## 2020-07-19 ENCOUNTER — Telehealth: Payer: Self-pay

## 2020-07-19 ENCOUNTER — Other Ambulatory Visit: Payer: Self-pay

## 2020-07-19 ENCOUNTER — Telehealth: Payer: Self-pay | Admitting: Cardiology

## 2020-07-19 ENCOUNTER — Ambulatory Visit (INDEPENDENT_AMBULATORY_CARE_PROVIDER_SITE_OTHER): Payer: Medicare HMO

## 2020-07-19 DIAGNOSIS — R011 Cardiac murmur, unspecified: Secondary | ICD-10-CM

## 2020-07-19 LAB — ECHOCARDIOGRAM COMPLETE
Area-P 1/2: 3.61 cm2
Calc EF: 52 %
S' Lateral: 2.4 cm
Single Plane A2C EF: 57.1 %
Single Plane A4C EF: 52.1 %

## 2020-07-19 MED ORDER — ROSUVASTATIN CALCIUM 10 MG PO TABS: 10.0000 mg | ORAL_TABLET | Freq: Every day | ORAL | 3 refills | Status: DC

## 2020-07-19 NOTE — Telephone Encounter (Signed)
-----   Message from Jenean Lindau, MD sent at 07/19/2020  1:13 PM EDT ----- The results of the study is unremarkable. Please inform patient. I will discuss in detail at next appointment. Cc  primary care/referring physician Jenean Lindau, MD 07/19/2020 1:13 PM

## 2020-07-19 NOTE — Telephone Encounter (Signed)
Spoke with patient regarding results and recommendation.  Patient verbalizes understanding and is agreeable to plan of care. Advised patient to call back with any issues or concerns.  

## 2020-07-19 NOTE — Telephone Encounter (Signed)
Patient needs refill on rosuvastatin (CRESTOR) 10 MG tablet [facility] [address]

## 2020-07-19 NOTE — Progress Notes (Signed)
Complete echocardiogram performed.  Jimmy Tayvia Faughnan RDCS, RVT  

## 2020-07-19 NOTE — Telephone Encounter (Signed)
Rosuvastatin 10 mg # 90 tablets X 3 to pharmacy

## 2020-07-23 ENCOUNTER — Other Ambulatory Visit: Payer: Self-pay

## 2020-07-23 ENCOUNTER — Encounter: Payer: Self-pay | Admitting: Neurology

## 2020-07-23 ENCOUNTER — Ambulatory Visit: Payer: Medicare HMO | Admitting: Neurology

## 2020-07-23 VITALS — BP 106/68 | HR 100 | Ht 62.0 in | Wt 121.8 lb

## 2020-07-23 DIAGNOSIS — G250 Essential tremor: Secondary | ICD-10-CM | POA: Diagnosis not present

## 2020-07-23 DIAGNOSIS — G40009 Localization-related (focal) (partial) idiopathic epilepsy and epileptic syndromes with seizures of localized onset, not intractable, without status epilepticus: Secondary | ICD-10-CM

## 2020-07-23 MED ORDER — DIVALPROEX SODIUM ER 500 MG PO TB24: 500.0000 mg | ORAL_TABLET | Freq: Every evening | ORAL | 3 refills | Status: DC
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
## 2020-07-30 DIAGNOSIS — Z48812 Encounter for surgical aftercare following surgery on the circulatory system: Secondary | ICD-10-CM | POA: Diagnosis not present

## 2020-08-06 DIAGNOSIS — Z6821 Body mass index (BMI) 21.0-21.9, adult: Secondary | ICD-10-CM | POA: Diagnosis not present

## 2020-08-06 DIAGNOSIS — R011 Cardiac murmur, unspecified: Secondary | ICD-10-CM | POA: Diagnosis not present

## 2020-08-06 DIAGNOSIS — N3001 Acute cystitis with hematuria: Secondary | ICD-10-CM | POA: Diagnosis not present

## 2020-08-06 DIAGNOSIS — F17200 Nicotine dependence, unspecified, uncomplicated: Secondary | ICD-10-CM | POA: Diagnosis not present

## 2020-08-06 DIAGNOSIS — I671 Cerebral aneurysm, nonruptured: Secondary | ICD-10-CM | POA: Diagnosis not present

## 2020-09-05 DIAGNOSIS — Z79899 Other long term (current) drug therapy: Secondary | ICD-10-CM | POA: Diagnosis not present

## 2020-09-05 DIAGNOSIS — Z131 Encounter for screening for diabetes mellitus: Secondary | ICD-10-CM | POA: Diagnosis not present

## 2020-09-05 DIAGNOSIS — R569 Unspecified convulsions: Secondary | ICD-10-CM | POA: Diagnosis not present

## 2020-09-05 DIAGNOSIS — E785 Hyperlipidemia, unspecified: Secondary | ICD-10-CM | POA: Diagnosis not present

## 2020-09-05 DIAGNOSIS — I720 Aneurysm of carotid artery: Secondary | ICD-10-CM | POA: Diagnosis not present

## 2020-09-05 DIAGNOSIS — F17210 Nicotine dependence, cigarettes, uncomplicated: Secondary | ICD-10-CM | POA: Diagnosis not present

## 2020-09-05 DIAGNOSIS — G40909 Epilepsy, unspecified, not intractable, without status epilepticus: Secondary | ICD-10-CM | POA: Diagnosis not present

## 2020-09-05 DIAGNOSIS — G250 Essential tremor: Secondary | ICD-10-CM | POA: Diagnosis not present

## 2020-09-05 DIAGNOSIS — Z0000 Encounter for general adult medical examination without abnormal findings: Secondary | ICD-10-CM | POA: Diagnosis not present

## 2020-09-06 DIAGNOSIS — Z1272 Encounter for screening for malignant neoplasm of vagina: Secondary | ICD-10-CM | POA: Diagnosis not present

## 2020-10-05 DIAGNOSIS — R87811 Vaginal high risk human papillomavirus (HPV) DNA test positive: Secondary | ICD-10-CM | POA: Diagnosis not present

## 2020-10-05 DIAGNOSIS — F17210 Nicotine dependence, cigarettes, uncomplicated: Secondary | ICD-10-CM | POA: Diagnosis not present

## 2020-10-05 DIAGNOSIS — T819XXA Unspecified complication of procedure, initial encounter: Secondary | ICD-10-CM | POA: Diagnosis not present

## 2020-10-05 DIAGNOSIS — G250 Essential tremor: Secondary | ICD-10-CM | POA: Diagnosis not present

## 2020-10-05 DIAGNOSIS — Z6821 Body mass index (BMI) 21.0-21.9, adult: Secondary | ICD-10-CM | POA: Diagnosis not present

## 2020-10-05 DIAGNOSIS — B373 Candidiasis of vulva and vagina: Secondary | ICD-10-CM | POA: Diagnosis not present

## 2020-10-05 DIAGNOSIS — G40909 Epilepsy, unspecified, not intractable, without status epilepticus: Secondary | ICD-10-CM | POA: Diagnosis not present

## 2020-10-08 ENCOUNTER — Telehealth: Payer: Self-pay

## 2020-10-08 NOTE — Telephone Encounter (Signed)
Lindsay Erickson that Dr. Denman George reviewed the records and stated that Ms Hancox needs a referral to a OB/GYN for a colposcopy.  A referral to gyn is not needed at this time. Clayton Bibles the phone number to [facility] for referral.[phone]

## 2020-10-25 DIAGNOSIS — Z1231 Encounter for screening mammogram for malignant neoplasm of breast: Secondary | ICD-10-CM | POA: Diagnosis not present

## 2020-10-25 DIAGNOSIS — B977 Papillomavirus as the cause of diseases classified elsewhere: Secondary | ICD-10-CM | POA: Diagnosis not present

## 2020-10-25 DIAGNOSIS — R87810 Cervical high risk human papillomavirus (HPV) DNA test positive: Secondary | ICD-10-CM | POA: Diagnosis not present

## 2020-10-25 DIAGNOSIS — Z6822 Body mass index (BMI) 22.0-22.9, adult: Secondary | ICD-10-CM | POA: Diagnosis not present

## 2020-10-30 ENCOUNTER — Other Ambulatory Visit (HOSPITAL_COMMUNITY): Payer: Self-pay | Admitting: Interventional Radiology

## 2020-10-30 DIAGNOSIS — I671 Cerebral aneurysm, nonruptured: Secondary | ICD-10-CM

## 2020-11-16 ENCOUNTER — Ambulatory Visit (HOSPITAL_COMMUNITY)
Admission: RE | Admit: 2020-11-16 | Discharge: 2020-11-16 | Disposition: A | Discharge status: Home / Self Care | Payer: Medicare HMO | Source: Ambulatory Visit | Attending: Interventional Radiology | Admitting: Interventional Radiology

## 2020-11-16 ENCOUNTER — Other Ambulatory Visit: Payer: Self-pay

## 2020-11-16 DIAGNOSIS — I671 Cerebral aneurysm, nonruptured: Secondary | ICD-10-CM | POA: Diagnosis not present

## 2020-11-16 DIAGNOSIS — I720 Aneurysm of carotid artery: Secondary | ICD-10-CM | POA: Diagnosis not present

## 2020-11-21 ENCOUNTER — Other Ambulatory Visit (HOSPITAL_COMMUNITY): Payer: Self-pay | Admitting: Interventional Radiology

## 2020-11-21 ENCOUNTER — Telehealth (HOSPITAL_COMMUNITY): Payer: Self-pay

## 2020-11-21 DIAGNOSIS — I671 Cerebral aneurysm, nonruptured: Secondary | ICD-10-CM

## 2020-11-21 NOTE — Telephone Encounter (Signed)
Called to schedule consult to discuss imaging, no answer, left vm. AW

## 2020-11-30 ENCOUNTER — Other Ambulatory Visit (HOSPITAL_COMMUNITY): Payer: Self-pay | Admitting: Radiology

## 2020-11-30 ENCOUNTER — Ambulatory Visit (HOSPITAL_COMMUNITY)
Admission: RE | Admit: 2020-11-30 | Discharge: 2020-11-30 | Disposition: A | Discharge status: Home / Self Care | Payer: Medicare HMO | Source: Ambulatory Visit | Attending: Interventional Radiology | Admitting: Interventional Radiology

## 2020-11-30 ENCOUNTER — Other Ambulatory Visit: Payer: Self-pay

## 2020-11-30 DIAGNOSIS — Z9889 Other specified postprocedural states: Secondary | ICD-10-CM | POA: Diagnosis not present

## 2020-11-30 DIAGNOSIS — I671 Cerebral aneurysm, nonruptured: Secondary | ICD-10-CM

## 2020-11-30 DIAGNOSIS — I720 Aneurysm of carotid artery: Secondary | ICD-10-CM | POA: Diagnosis not present

## 2020-12-03 HISTORY — PX: IR RADIOLOGIST EVAL & MGMT: IMG5224

## 2020-12-03 LAB — PLATELET INHIBITION P2Y12

## 2020-12-04 DIAGNOSIS — M1710 Unilateral primary osteoarthritis, unspecified knee: Secondary | ICD-10-CM | POA: Diagnosis not present

## 2020-12-04 DIAGNOSIS — M25561 Pain in right knee: Secondary | ICD-10-CM | POA: Diagnosis not present

## 2020-12-04 DIAGNOSIS — N3001 Acute cystitis with hematuria: Secondary | ICD-10-CM | POA: Diagnosis not present

## 2020-12-04 DIAGNOSIS — Z6821 Body mass index (BMI) 21.0-21.9, adult: Secondary | ICD-10-CM | POA: Diagnosis not present

## 2020-12-07 ENCOUNTER — Other Ambulatory Visit (HOSPITAL_COMMUNITY): Payer: Self-pay | Admitting: Radiology

## 2020-12-07 ENCOUNTER — Other Ambulatory Visit (HOSPITAL_COMMUNITY)
Admission: RE | Admit: 2020-12-07 | Discharge: 2020-12-07 | Disposition: A | Discharge status: Home / Self Care | Payer: Medicare HMO | Source: Other Acute Inpatient Hospital | Attending: Interventional Radiology | Admitting: Interventional Radiology

## 2020-12-07 DIAGNOSIS — I671 Cerebral aneurysm, nonruptured: Secondary | ICD-10-CM | POA: Diagnosis not present

## 2020-12-10 LAB — PLATELET INHIBITION P2Y12: Platelet Function  P2Y12: 8 [PRU] — ABNORMAL LOW (ref 182–335)

## 2020-12-12 ENCOUNTER — Telehealth: Payer: Self-pay | Admitting: Student

## 2020-12-12 NOTE — Telephone Encounter (Signed)
Most recent P2Y12 was 8. Patient has been instructed to take half a tablet of plavix (37.5 mg) every THREE days. This is a change from her prior instructions to take 37.5 mg every two days.   The patient will have a repeat P2Y12 in approximately 2 weeks. A scheduler from our office will call the patient to arrange the date/time.  The patient verbalized understanding of these instructions. She took a dose this morning and she was able to verbalize that her next dose will be Saturday 12/15/20.  Mrs. Debenedictis knows to call our office with any questions/concerns.  Soyla Dryer, [place] [phone] 12/12/2020, 3:02 PM

## 2020-12-12 NOTE — Telephone Encounter (Signed)
Addendum to today's note:  The patient will continue to take 81 mg of ASA daily. The patient is aware of this instruction.  Soyla Dryer, [place] [phone] 12/12/2020, 3:04 PM

## 2020-12-19 DIAGNOSIS — J208 Acute bronchitis due to other specified organisms: Secondary | ICD-10-CM | POA: Diagnosis not present

## 2020-12-19 DIAGNOSIS — N3001 Acute cystitis with hematuria: Secondary | ICD-10-CM | POA: Diagnosis not present

## 2020-12-19 DIAGNOSIS — Z6820 Body mass index (BMI) 20.0-20.9, adult: Secondary | ICD-10-CM | POA: Diagnosis not present

## 2021-01-09 DIAGNOSIS — N3001 Acute cystitis with hematuria: Secondary | ICD-10-CM | POA: Diagnosis not present

## 2021-01-09 DIAGNOSIS — Z6821 Body mass index (BMI) 21.0-21.9, adult: Secondary | ICD-10-CM | POA: Diagnosis not present

## 2021-01-16 ENCOUNTER — Ambulatory Visit: Payer: Medicare HMO | Admitting: Cardiology

## 2021-01-16 ENCOUNTER — Telehealth: Payer: Self-pay | Admitting: Radiology

## 2021-01-16 ENCOUNTER — Encounter: Payer: Self-pay | Admitting: Cardiology

## 2021-01-16 ENCOUNTER — Other Ambulatory Visit: Payer: Self-pay

## 2021-01-16 VITALS — BP 112/70 | HR 118 | Ht 62.0 in | Wt 119.8 lb

## 2021-01-16 DIAGNOSIS — E782 Mixed hyperlipidemia: Secondary | ICD-10-CM | POA: Diagnosis not present

## 2021-01-16 DIAGNOSIS — F17210 Nicotine dependence, cigarettes, uncomplicated: Secondary | ICD-10-CM

## 2021-01-16 DIAGNOSIS — R0789 Other chest pain: Secondary | ICD-10-CM

## 2021-01-16 HISTORY — DX: Nicotine dependence, cigarettes, uncomplicated: F17.210

## 2021-01-16 NOTE — Telephone Encounter (Signed)
Pt's pharmacy [facility] contacted regarding need for plavix refill for pt. Refill authorized x1 by Dr. Estanislado Pandy.

## 2021-01-16 NOTE — Patient Instructions (Signed)

## 2021-01-16 NOTE — Progress Notes (Signed)
Cardiology Office Note:    Date:  01/16/2021   ID:  Lindsay Erickson, DOB 24-May-1960, MRN [id number]  PCP:  Serita Grammes, MD  Cardiologist:  Jenean Lindau, MD   Referring MD: Serita Grammes, MD    ASSESSMENT:    1. Mixed dyslipidemia   2. Chest discomfort   3. Cigarette smoker    PLAN:    In order of problems listed above:  Primary prevention stressed with the patient.  Importance of compliance with diet medication stressed and she vocalized understanding. Intracranial cerebral artery aneurysm postembolization.  She is followed by interventional radiology for this.  She is on aspirin and Plavix for the same reason Mixed dyslipidemia: Diet emphasized.  Lifestyle modification urged.  She is fasting and will get complete blood work today. Anemia: Previous blood work has revealed significant anemia.  I do not have any follow-up for this.  I will do a CBC also along with today's blood and send it to her primary provider. Cigarette smoker: I spent 5 minutes with the patient discussing solely about smoking. Smoking cessation was counseled. I suggested to the patient also different medications and pharmacological interventions. Patient is keen to try stopping on its own at this time. He will get back to me if he needs any further assistance in this matter. Patient will be seen in follow-up appointment in 6 months or earlier if the patient has any concerns    Medication Adjustments/Labs and Tests Ordered: Current medicines are reviewed at length with the patient today.  Concerns regarding medicines are outlined above.  Orders Placed This Encounter  Procedures   Basic metabolic panel   CBC with Differential/Platelet   Hepatic function panel   Lipid panel   TSH    No orders of the defined types were placed in this encounter.    No chief complaint on file.    History of Present Illness:    Lindsay Erickson is a 60 y.o. female.  Patient has past medical history of  mixed dyslipidemia and cerebral aneurysm for which she has undergone intervention.  Unfortunately she tells me that she is smoking.  She denies any chest pain orthopnea or PND.  She leads a sedentary lifestyle.  At the time of my evaluation, the patient is alert awake oriented and in no distress.  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   Benign familial tremor 06/17/2017   Brain aneurysm 05/14/2020   Cardiac murmur 07/11/2020   Chest discomfort 06/22/2018   Chronic tension-type headache, not intractable 05/10/2014   Constipation     05/11/20- improved   Depression    Diplopia 09/30/2017   Dyslipidemia    Essential tremor    History of kidney stones    Memory loss    Migraine without aura and without status migrainosus, not intractable 06/17/2017   Mixed dyslipidemia 06/22/2018   Partial epilepsy with impairment of consciousness ([place]) 07/26/2013   Peripheral vascular disease ([place])    blood clot in leg after being kicked by a horse - age 50   Pneumonia    PONV (postoperative nausea and vomiting)    after breast biopsy and headache.   Seizure disorder (HCC)    Tremor, essential     Past Surgical History:  Procedure Laterality Date   ABDOMINAL HYSTERECTOMY     APPLICATION OF WOUND VAC Right 05/14/2020  Procedure: APPLICATION OF WOUND VAC;  Surgeon: Cherre Robins, MD;  Location: [facility] OR;  Service: Vascular;  Laterality: Right;   BREAST BIOPSY Left    X3   CARPAL TUNNEL RELEASE     CATARACT EXTRACTION Bilateral    FALSE ANEURYSM REPAIR Right 05/14/2020   Procedure: REPAIR FALSE ANEURYSM;  Surgeon: Cherre Robins, MD;  Location: [facility] OR;  Service: Vascular;  Laterality: Right;   IR ANGIO INTRA EXTRACRAN SEL COM CAROTID INNOMINATE BILAT MOD SED  01/12/2020   IR ANGIO INTRA EXTRACRAN SEL INTERNAL CAROTID UNI L MOD SED  05/14/2020   IR ANGIO VERTEBRAL SEL VERTEBRAL BILAT MOD SED  01/12/2020    IR ANGIOGRAM FOLLOW UP STUDY  05/14/2020   IR ANGIOGRAM FOLLOW UP STUDY  05/14/2020   IR RADIOLOGIST EVAL & MGMT  06/06/2020   IR RADIOLOGIST EVAL & MGMT  12/03/2020   IR TRANSCATH/EMBOLIZ  05/14/2020   IR US GUIDE VASC ACCESS RIGHT  01/12/2020   RADIOLOGY WITH ANESTHESIA N/A 05/14/2020   Procedure: IR WITH ANESTHESIA ANEURYSM EMBOLIZATION;  Surgeon: [name], MD;  Location: [facility];  Service: Radiology;  Laterality: N/A;   SALPINGOOPHORECTOMY     both ovaries removed    Current Medications: Current Meds  Medication Sig   acetaminophen (TYLENOL) 500 MG tablet Take 500 mg by mouth every 8 (eight) hours as needed for moderate pain.   albuterol (VENTOLIN HFA) 108 (90 Base) MCG/ACT inhaler Inhale 1-2 puffs into the lungs as needed for wheezing or shortness of breath.   aspirin EC 81 MG tablet Take 81 mg by mouth daily. Swallow whole.   clopidogrel (PLAVIX) 75 MG tablet Take 37.5 mg by mouth as directed. Every 3 days   divalproex (DEPAKOTE ER) 500 MG 24 hr tablet Take 1 tablet (500 mg total) by mouth every evening.   rosuvastatin (CRESTOR) 10 MG tablet Take 1 tablet (10 mg total) by mouth at bedtime.     Allergies:   Gabapentin, Primidone, Propranolol, Venlafaxine, Latex, Nickel, and Codeine   Social History   Socioeconomic History   Marital status: Married    Spouse name: Not on file   Number of children: Not on file   Years of education: Not on file   Highest education level: Not on file  Occupational History   Not on file  Tobacco Use   Smoking status: Every Day    Packs/day: 0.25    Types: Cigarettes   Smokeless tobacco: Never  Vaping Use   Vaping Use: Never used  Substance and Sexual Activity   Alcohol use: Yes    Comment: Rare, special occasion   Drug use: Never   Sexual activity: Not on file  Other Topics Concern   Not on file  Social History Narrative   One story home mobile home   Right handed   Drinks caffeine on occasion   Social Determinants of Health    Financial Resource Strain: Not on file  Food Insecurity: Not on file  Transportation Needs: Not on file  Physical Activity: Not on file  Stress: Not on file  Social Connections: Not on file     Family History: The patient's family history includes Atrial fibrillation in her sister; Diabetes in her brother and maternal grandmother; Heart attack in her maternal uncle and maternal uncle; Heart disease in her father and mother; Stroke in her mother; Tremor in her father and mother.  ROS:   Please see the history of present illness.    All other  systems reviewed and are negative.  EKGs/Labs/Other Studies Reviewed:    The following studies were reviewed today: IMPRESSIONS     1. Left ventricular ejection fraction, by estimation, is 55 to 60%. The  left ventricle has normal function. The left ventricle has no regional  wall motion abnormalities. There is mild concentric left ventricular  hypertrophy. Left ventricular diastolic  parameters are consistent with Grade I diastolic dysfunction (impaired  relaxation).   2. Right ventricular systolic function is normal. The right ventricular  size is normal.   3. The mitral valve is normal in structure. No evidence of mitral valve  regurgitation. No evidence of mitral stenosis.   4. The aortic valve is tricuspid. Aortic valve regurgitation is not  visualized. No aortic stenosis is present.   5. The inferior vena cava is normal in size with greater than 50%  respiratory variability, suggesting right atrial pressure of 3 mmHg.    Recent Labs: 05/15/2020: BUN 11; Creatinine, Ser 0.54; Hemoglobin 10.1; Platelets 171; Potassium 4.0; Sodium 137  Recent Lipid Panel    Component Value Date/Time   CHOL 151 02/25/2019 0950   TRIG 118 02/25/2019 0950   HDL 52 02/25/2019 0950   CHOLHDL 2.9 02/25/2019 0950   LDLCALC 78 02/25/2019 0950    Physical Exam:    VS:  BP 112/70   Pulse (!) 118   Ht '5\' 2"'$  (1.575 m)   Wt 119 lb 12.8 oz (54.3 kg)    SpO2 98%   BMI 21.91 kg/m     Wt Readings from Last 3 Encounters:  01/16/21 119 lb 12.8 oz (54.3 kg)  07/23/20 121 lb 12.8 oz (55.2 kg)  07/11/20 119 lb 9.6 oz (54.3 kg)     GEN: Patient is in no acute distress HEENT: Normal NECK: No JVD; No carotid bruits LYMPHATICS: No lymphadenopathy CARDIAC: Hear sounds regular, 2/6 systolic murmur at the apex. RESPIRATORY:  Clear to auscultation without rales, wheezing or rhonchi  ABDOMEN: Soft, non-tender, non-distended MUSCULOSKELETAL:  No edema; No deformity  SKIN: Warm and dry NEUROLOGIC:  Alert and oriented x 3 PSYCHIATRIC:  Normal affect   Signed, Jenean Lindau, MD  01/16/2021 9:23 AM    [place]

## 2021-01-17 LAB — LIPID PANEL
Chol/HDL Ratio: 3.6 ratio (ref 0.0–4.4)
Cholesterol, Total: 160 mg/dL (ref 100–199)
HDL: 45 mg/dL (ref >39)
LDL Chol Calc (NIH): 89 mg/dL (ref 0–99)
Triglycerides: 151 mg/dL — ABNORMAL HIGH (ref 0–149)
VLDL Cholesterol Cal: 26 mg/dL (ref 5–40)

## 2021-01-17 LAB — HEPATIC FUNCTION PANEL
ALT: 10 IU/L (ref 0–32)
AST: 16 IU/L (ref 0–40)
Albumin: 4.3 g/dL (ref 3.8–4.8)
Alkaline Phosphatase: 116 IU/L (ref 44–121)
Bilirubin Total: <0.2 mg/dL (ref 0.0–1.2)
Bilirubin, Direct: <0.1 mg/dL (ref 0.00–0.40)
Total Protein: 6.9 g/dL (ref 6.0–8.5)

## 2021-01-17 LAB — CBC WITH DIFFERENTIAL/PLATELET
Basophils Absolute: 0 10*3/uL (ref 0.0–0.2)
Basos: 1 %
EOS (ABSOLUTE): 0.2 10*3/uL (ref 0.0–0.4)
Eos: 3 %
Hematocrit: 37.8 % (ref 34.0–46.6)
Hemoglobin: 12.8 g/dL (ref 11.1–15.9)
Immature Grans (Abs): 0 10*3/uL (ref 0.0–0.1)
Immature Granulocytes: 0 %
Lymphocytes Absolute: 2.6 10*3/uL (ref 0.7–3.1)
Lymphs: 39 %
MCH: 31.3 pg (ref 26.6–33.0)
MCHC: 33.9 g/dL (ref 31.5–35.7)
MCV: 92 fL (ref 79–97)
Monocytes Absolute: 0.6 10*3/uL (ref 0.1–0.9)
Monocytes: 9 %
Neutrophils Absolute: 3.1 10*3/uL (ref 1.4–7.0)
Neutrophils: 48 %
Platelets: 333 10*3/uL (ref 150–450)
RBC: 4.09 x10E6/uL (ref 3.77–5.28)
RDW: 14.6 % (ref 11.7–15.4)
WBC: 6.6 10*3/uL (ref 3.4–10.8)

## 2021-01-17 LAB — BASIC METABOLIC PANEL
BUN/Creatinine Ratio: 19 (ref 12–28)
BUN: 12 mg/dL (ref 8–27)
CO2: 24 mmol/L (ref 20–29)
Calcium: 9.6 mg/dL (ref 8.7–10.3)
Chloride: 103 mmol/L (ref 96–106)
Creatinine, Ser: 0.63 mg/dL (ref 0.57–1.00)
Glucose: 101 mg/dL — ABNORMAL HIGH (ref 65–99)
Potassium: 4 mmol/L (ref 3.5–5.2)
Sodium: 142 mmol/L (ref 134–144)
eGFR: 100 mL/min/{1.73_m2} (ref >59)

## 2021-01-17 LAB — TSH: TSH: 1.87 u[IU]/mL (ref 0.450–4.500)

## 2021-01-18 ENCOUNTER — Telehealth: Payer: Self-pay

## 2021-01-18 ENCOUNTER — Other Ambulatory Visit (HOSPITAL_COMMUNITY)
Admission: RE | Admit: 2021-01-18 | Discharge: 2021-01-18 | Disposition: A | Discharge status: Home / Self Care | Payer: Medicare HMO | Source: Ambulatory Visit | Attending: Interventional Radiology | Admitting: Interventional Radiology

## 2021-01-18 ENCOUNTER — Other Ambulatory Visit (HOSPITAL_COMMUNITY): Payer: Self-pay

## 2021-01-18 ENCOUNTER — Other Ambulatory Visit (HOSPITAL_COMMUNITY): Payer: Self-pay | Admitting: Interventional Radiology

## 2021-01-18 DIAGNOSIS — I729 Aneurysm of unspecified site: Secondary | ICD-10-CM

## 2021-01-18 NOTE — Telephone Encounter (Signed)
Spoke with patient regarding results and recommendation.  Patient verbalizes understanding and is agreeable to plan of care. Advised patient to call back with any issues or concerns.  

## 2021-01-18 NOTE — Telephone Encounter (Signed)
-----   Message from Jenean Lindau, MD sent at 01/17/2021  4:49 PM EDT ----- The results of the study is unremarkable. Please inform patient. I will discuss in detail at next appointment. Cc  primary care/referring physician Jenean Lindau, MD 01/17/2021 4:49 PM

## 2021-01-22 ENCOUNTER — Telehealth: Payer: Self-pay | Admitting: Student

## 2021-01-22 LAB — PLATELET INHIBITION P2Y12

## 2021-01-22 NOTE — Telephone Encounter (Signed)
Patient with recent P2Y12 of 7. Patient called and advised to decrease Plavix down to 37.5 mg once per week (prior dose was 37.5 mg every three days). Patient is to continue taking 81 mg aspirin daily. We will repeat P2Y12 level next week. Patient verbalized understanding. A scheduler will call with the date/time of lab appointment.  Soyla Dryer, [place] [phone] 01/22/2021, 3:33 PM

## 2021-01-28 ENCOUNTER — Telehealth (HOSPITAL_COMMUNITY): Payer: Self-pay

## 2021-01-28 NOTE — Telephone Encounter (Signed)
-----   Message from Theresa Duty, NP sent at 01/22/2021  3:35 PM EDT ----- Please schedule for p2Y12 recheck next Friday, 02/01/21. Dev wants it rechecked next week sometimes but the patient prefers Friday, if possible.   Thanks!! - Roselyn Reef

## 2021-01-31 ENCOUNTER — Other Ambulatory Visit (HOSPITAL_COMMUNITY): Payer: Self-pay

## 2021-01-31 ENCOUNTER — Other Ambulatory Visit (HOSPITAL_COMMUNITY): Payer: Self-pay | Admitting: Interventional Radiology

## 2021-01-31 ENCOUNTER — Other Ambulatory Visit (HOSPITAL_COMMUNITY)
Admission: RE | Admit: 2021-01-31 | Discharge: 2021-01-31 | Disposition: A | Discharge status: Home / Self Care | Payer: Medicare HMO | Source: Ambulatory Visit | Attending: Interventional Radiology | Admitting: Interventional Radiology

## 2021-01-31 DIAGNOSIS — I729 Aneurysm of unspecified site: Secondary | ICD-10-CM | POA: Diagnosis not present

## 2021-01-31 DIAGNOSIS — Z23 Encounter for immunization: Secondary | ICD-10-CM | POA: Diagnosis not present

## 2021-02-05 LAB — PLATELET INHIBITION P2Y12

## 2021-02-12 ENCOUNTER — Telehealth (HOSPITAL_COMMUNITY): Payer: Self-pay

## 2021-02-12 NOTE — Telephone Encounter (Signed)
-----   Message from Joaquim Nam, PA-C sent at 02/05/2021  4:18 PM EDT ----- Regarding: p2y12 follow up Hello!  Dr. Estanislado Pandy reviewed this patient's p2y12 (134 on 9/29) - he recommends she continue Plavix 37.5 mg once per week and ASA 81 mg QD.   Thanks! Larene Beach

## 2021-02-19 DIAGNOSIS — Z13820 Encounter for screening for osteoporosis: Secondary | ICD-10-CM | POA: Diagnosis not present

## 2021-02-19 DIAGNOSIS — M810 Age-related osteoporosis without current pathological fracture: Secondary | ICD-10-CM | POA: Diagnosis not present

## 2021-02-22 ENCOUNTER — Other Ambulatory Visit (HOSPITAL_COMMUNITY): Payer: Self-pay | Admitting: Interventional Radiology

## 2021-02-22 ENCOUNTER — Telehealth (HOSPITAL_COMMUNITY): Payer: Self-pay | Admitting: Radiology

## 2021-02-22 DIAGNOSIS — I671 Cerebral aneurysm, nonruptured: Secondary | ICD-10-CM

## 2021-02-22 NOTE — Telephone Encounter (Signed)
Called pt to schedule her brain aneurysm tx with Dr. Estanislado Pandy. She will speak to her husband and call back to schedule. JM

## 2021-02-26 ENCOUNTER — Encounter (HOSPITAL_COMMUNITY): Payer: Self-pay

## 2021-03-01 ENCOUNTER — Other Ambulatory Visit (HOSPITAL_COMMUNITY): Payer: Self-pay | Admitting: Radiology

## 2021-03-01 ENCOUNTER — Other Ambulatory Visit (HOSPITAL_COMMUNITY): Payer: Self-pay | Admitting: Interventional Radiology

## 2021-03-01 ENCOUNTER — Other Ambulatory Visit (HOSPITAL_COMMUNITY)
Admission: RE | Admit: 2021-03-01 | Discharge: 2021-03-01 | Disposition: A | Discharge status: Home / Self Care | Payer: Medicare HMO | Source: Ambulatory Visit | Attending: Interventional Radiology | Admitting: Interventional Radiology

## 2021-03-01 DIAGNOSIS — I671 Cerebral aneurysm, nonruptured: Secondary | ICD-10-CM | POA: Insufficient documentation

## 2021-03-04 LAB — PLATELET INHIBITION P2Y12

## 2021-03-08 DIAGNOSIS — Z23 Encounter for immunization: Secondary | ICD-10-CM | POA: Diagnosis not present

## 2021-03-11 ENCOUNTER — Telehealth: Payer: Self-pay | Admitting: Student

## 2021-03-11 NOTE — Telephone Encounter (Signed)
Lindsay Erickson is known to Dr. 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   Patient has been on ASA and Plavix and underwent multiple P2Y12 testings.  Patient is scheduled for cerebral angiogram with intervention on Monday 11/14, her most recent P2Y12 on 10/28 was elevated at 155.  Discussed with Dr. Estanislado Pandy, patient to take Half pill of Plavix (37.5mg ) TWICE a week instead of once a week. No further P2Y12 testing prior to intervention needed per Dr. Estanislado Pandy.   Called and informed Lindsay Kanaan above recommendation, she verbalized understanding.  Please call NIR for further questions.   Armando Gang Josha Weekley PA-C 03/11/2021 1:09 PM

## 2021-03-14 ENCOUNTER — Other Ambulatory Visit: Payer: Self-pay | Admitting: Radiology

## 2021-03-15 ENCOUNTER — Other Ambulatory Visit (HOSPITAL_COMMUNITY): Payer: Self-pay | Admitting: Interventional Radiology

## 2021-03-15 ENCOUNTER — Other Ambulatory Visit: Payer: Self-pay

## 2021-03-15 ENCOUNTER — Other Ambulatory Visit: Payer: Self-pay | Admitting: Radiology

## 2021-03-15 ENCOUNTER — Encounter (HOSPITAL_COMMUNITY): Payer: Self-pay | Admitting: Interventional Radiology

## 2021-03-15 ENCOUNTER — Other Ambulatory Visit: Payer: Self-pay | Admitting: Physician Assistant

## 2021-03-15 LAB — SARS CORONAVIRUS 2 (TAT 6-24 HRS): SARS Coronavirus 2: NEGATIVE

## 2021-03-15 NOTE — Progress Notes (Signed)
DUE TO COVID-19 ONLY ONE VISITOR IS ALLOWED TO COME WITH YOU AND STAY IN THE WAITING ROOM ONLY DURING PRE OP AND PROCEDURE DAY OF SURGERY.   Two  VISITORS  MAY VISIT WITH YOU AFTER SURGERY IN YOUR PRIVATE ROOM DURING VISITING HOURS ONLY!  PCP - Dr Serita Grammes Cardiologist - Dr [name] Neurology - Dr [name]  Chest x-ray - n/a EKG - 07/11/20 Stress Test - 07/20/18 ECHO - 07/19/20 Cardiac Cath - n/a  ICD Pacemaker/Loop - n/a  Sleep Study -  n/a CPAP - none  Diabetes - n/a  Blood Thinner Instructions:  Follow your surgeon's instructions on when to stop Plavix prior to surgery.  Aspirin Instructions: Follow your surgeon's instructions on when to stop aspirin prior to surgery,  If no instructions were given by your surgeon then you will need to call the office for those instructions.  Anesthesia review: Yes  STOP now taking any Aspirin (unless otherwise instructed by your surgeon), Aleve, Naproxen, Ibuprofen, Motrin, Advil, Goody's, BC's, all herbal medications, fish oil, and all vitamins.   Coronavirus Screening Covid test is scheduled on DOS. Do you have any of the following symptoms:  Cough yes/no: No Fever (>100.56F)  yes/no: No Runny nose yes/no: No Sore throat yes/no: No Difficulty breathing/shortness of breath  yes/no: No  Have you traveled in the last 14 days and where? yes/no: No  Patient/husband Billy verbalized understanding of instructions that were given via phone.

## 2021-03-18 ENCOUNTER — Ambulatory Visit (HOSPITAL_COMMUNITY): Payer: Medicare HMO | Admitting: Physician Assistant

## 2021-03-18 ENCOUNTER — Other Ambulatory Visit: Payer: Self-pay

## 2021-03-18 ENCOUNTER — Encounter (HOSPITAL_COMMUNITY): Payer: Self-pay | Admitting: Interventional Radiology

## 2021-03-18 ENCOUNTER — Inpatient Hospital Stay (HOSPITAL_COMMUNITY)
Admission: AD | Admit: 2021-03-18 | Discharge: 2021-03-19 | DRG: 026 | Disposition: A | Discharge status: Home / Self Care | Payer: Medicare HMO | Attending: Interventional Radiology | Admitting: Interventional Radiology

## 2021-03-18 ENCOUNTER — Encounter (HOSPITAL_COMMUNITY): Payer: Self-pay

## 2021-03-18 ENCOUNTER — Encounter (HOSPITAL_COMMUNITY)
Admission: AD | Disposition: A | Discharge status: Home / Self Care | Payer: Self-pay | Source: Home / Self Care | Attending: Interventional Radiology

## 2021-03-18 ENCOUNTER — Inpatient Hospital Stay (HOSPITAL_COMMUNITY)
Admission: RE | Admit: 2021-03-18 | Discharge: 2021-03-18 | Disposition: A | Discharge status: Home / Self Care | Payer: Medicare HMO | Source: Ambulatory Visit | Attending: Interventional Radiology | Admitting: Interventional Radiology

## 2021-03-18 DIAGNOSIS — F17210 Nicotine dependence, cigarettes, uncomplicated: Secondary | ICD-10-CM | POA: Diagnosis present

## 2021-03-18 DIAGNOSIS — Z8249 Family history of ischemic heart disease and other diseases of the circulatory system: Secondary | ICD-10-CM | POA: Diagnosis not present

## 2021-03-18 DIAGNOSIS — Z20822 Contact with and (suspected) exposure to covid-19: Secondary | ICD-10-CM | POA: Diagnosis not present

## 2021-03-18 DIAGNOSIS — Z79899 Other long term (current) drug therapy: Secondary | ICD-10-CM

## 2021-03-18 DIAGNOSIS — Z833 Family history of diabetes mellitus: Secondary | ICD-10-CM

## 2021-03-18 DIAGNOSIS — E782 Mixed hyperlipidemia: Secondary | ICD-10-CM | POA: Diagnosis not present

## 2021-03-18 DIAGNOSIS — Z7982 Long term (current) use of aspirin: Secondary | ICD-10-CM | POA: Diagnosis not present

## 2021-03-18 DIAGNOSIS — Z7902 Long term (current) use of antithrombotics/antiplatelets: Secondary | ICD-10-CM | POA: Diagnosis not present

## 2021-03-18 DIAGNOSIS — I739 Peripheral vascular disease, unspecified: Secondary | ICD-10-CM | POA: Diagnosis present

## 2021-03-18 DIAGNOSIS — F418 Other specified anxiety disorders: Secondary | ICD-10-CM | POA: Diagnosis not present

## 2021-03-18 DIAGNOSIS — G40109 Localization-related (focal) (partial) symptomatic epilepsy and epileptic syndromes with simple partial seizures, not intractable, without status epilepticus: Secondary | ICD-10-CM | POA: Diagnosis present

## 2021-03-18 DIAGNOSIS — Z823 Family history of stroke: Secondary | ICD-10-CM

## 2021-03-18 DIAGNOSIS — I671 Cerebral aneurysm, nonruptured: Secondary | ICD-10-CM | POA: Diagnosis not present

## 2021-03-18 DIAGNOSIS — K219 Gastro-esophageal reflux disease without esophagitis: Secondary | ICD-10-CM | POA: Diagnosis not present

## 2021-03-18 DIAGNOSIS — G43009 Migraine without aura, not intractable, without status migrainosus: Secondary | ICD-10-CM | POA: Diagnosis present

## 2021-03-18 DIAGNOSIS — F419 Anxiety disorder, unspecified: Secondary | ICD-10-CM | POA: Diagnosis present

## 2021-03-18 DIAGNOSIS — G250 Essential tremor: Secondary | ICD-10-CM | POA: Diagnosis not present

## 2021-03-18 HISTORY — PX: IR TRANSCATH/EMBOLIZ: IMG695

## 2021-03-18 HISTORY — PX: RADIOLOGY WITH ANESTHESIA: SHX6223

## 2021-03-18 HISTORY — PX: IR US GUIDE VASC ACCESS RIGHT: IMG2390

## 2021-03-18 HISTORY — DX: Unspecified osteoarthritis, unspecified site: M19.90

## 2021-03-18 HISTORY — PX: IR 3D INDEPENDENT WKST: IMG2385

## 2021-03-18 HISTORY — DX: Papillomavirus as the cause of diseases classified elsewhere: B97.7

## 2021-03-18 HISTORY — PX: IR ANGIO INTRA EXTRACRAN SEL INTERNAL CAROTID UNI R MOD SED: IMG5362

## 2021-03-18 HISTORY — DX: Personal history of other medical treatment: Z92.89

## 2021-03-18 LAB — CBC
HCT: 37.7 % (ref 36.0–46.0)
Hemoglobin: 12.1 g/dL (ref 12.0–15.0)
MCH: 31.3 pg (ref 26.0–34.0)
MCHC: 32.1 g/dL (ref 30.0–36.0)
MCV: 97.7 fL (ref 80.0–100.0)
Platelets: 255 10*3/uL (ref 150–400)
RBC: 3.86 MIL/uL — ABNORMAL LOW (ref 3.87–5.11)
RDW: 14.6 % (ref 11.5–15.5)
WBC: 5.2 10*3/uL (ref 4.0–10.5)
nRBC: 0 % (ref 0.0–0.2)

## 2021-03-18 LAB — BASIC METABOLIC PANEL
Anion gap: 10 (ref 5–15)
BUN: 18 mg/dL (ref 8–23)
CO2: 24 mmol/L (ref 22–32)
Calcium: 9.3 mg/dL (ref 8.9–10.3)
Chloride: 105 mmol/L (ref 98–111)
Creatinine, Ser: 0.56 mg/dL (ref 0.44–1.00)
GFR, Estimated: >60 mL/min (ref >60)
Glucose, Bld: 90 mg/dL (ref 70–99)
Potassium: 3.7 mmol/L (ref 3.5–5.1)
Sodium: 139 mmol/L (ref 135–145)

## 2021-03-18 LAB — APTT: aPTT: 121 seconds — ABNORMAL HIGH (ref 24–36)

## 2021-03-18 LAB — PROTIME-INR
INR: 1 (ref 0.8–1.2)
Prothrombin Time: 12.8 seconds (ref 11.4–15.2)

## 2021-03-18 LAB — SURGICAL PCR SCREEN
MRSA, PCR: POSITIVE — AB
Staphylococcus aureus: POSITIVE — AB

## 2021-03-18 LAB — HEPARIN LEVEL (UNFRACTIONATED): Heparin Unfractionated: <0.1 IU/mL — ABNORMAL LOW (ref 0.30–0.70)

## 2021-03-18 LAB — POCT ACTIVATED CLOTTING TIME: Activated Clotting Time: 248 seconds

## 2021-03-18 SURGERY — IR WITH ANESTHESIA
Anesthesia: General

## 2021-03-18 MED ORDER — IOHEXOL 300 MG/ML  SOLN: 100.0000 mL | Freq: Once | INTRAMUSCULAR | Status: DC | PRN

## 2021-03-18 MED ORDER — CHLORHEXIDINE GLUCONATE CLOTH 2 % EX PADS
6.0000 | MEDICATED_PAD | Freq: Every day | CUTANEOUS | Status: DC
  Administered 2021-03-18: 6 via TOPICAL

## 2021-03-18 MED ORDER — NITROGLYCERIN 1 MG/10 ML FOR IR/CATH LAB
INTRA_ARTERIAL | Status: AC
  Filled 2021-03-18: qty 10

## 2021-03-18 MED ORDER — CLEVIDIPINE BUTYRATE 0.5 MG/ML IV EMUL
0.0000 mg/h | INTRAVENOUS | Status: AC
Start: 2021-03-18 — End: 2021-03-19
  Administered 2021-03-18: 2 mg/h via INTRAVENOUS
  Administered 2021-03-18: 1 mg/h via INTRAVENOUS
  Filled 2021-03-18: qty 50

## 2021-03-18 MED ORDER — CLEVIDIPINE BUTYRATE 0.5 MG/ML IV EMUL
INTRAVENOUS | Status: AC
  Filled 2021-03-18: qty 50

## 2021-03-18 MED ORDER — CEFAZOLIN SODIUM-DEXTROSE 2-4 GM/100ML-% IV SOLN
2.0000 g | INTRAVENOUS | Status: AC
  Administered 2021-03-18: 2 g via INTRAVENOUS
  Filled 2021-03-18: qty 100

## 2021-03-18 MED ORDER — ASPIRIN 81 MG PO CHEW: 81.0000 mg | CHEWABLE_TABLET | Freq: Every day | ORAL | Status: DC

## 2021-03-18 MED ORDER — SODIUM CHLORIDE 0.9 % IV SOLN: INTRAVENOUS | Status: DC | PRN

## 2021-03-18 MED ORDER — ACETAMINOPHEN 325 MG PO TABS: 650.0000 mg | ORAL_TABLET | ORAL | Status: DC | PRN

## 2021-03-18 MED ORDER — HEPARIN SODIUM (PORCINE) 1000 UNIT/ML IJ SOLN
INTRAMUSCULAR | Status: AC
  Filled 2021-03-18: qty 1

## 2021-03-18 MED ORDER — LIDOCAINE HCL 1 % IJ SOLN
INTRAMUSCULAR | Status: AC
  Filled 2021-03-18: qty 20

## 2021-03-18 MED ORDER — ONDANSETRON HCL 4 MG/2ML IJ SOLN
INTRAMUSCULAR | Status: DC | PRN
  Administered 2021-03-18: 4 mg via INTRAVENOUS

## 2021-03-18 MED ORDER — FENTANYL CITRATE (PF) 250 MCG/5ML IJ SOLN
INTRAMUSCULAR | Status: DC | PRN
  Administered 2021-03-18: 50 ug via INTRAVENOUS

## 2021-03-18 MED ORDER — SODIUM CHLORIDE 0.9 % IV SOLN: INTRAVENOUS | Status: DC

## 2021-03-18 MED ORDER — HEPARIN (PORCINE) 25000 UT/250ML-% IV SOLN
INTRAVENOUS | Status: AC
  Filled 2021-03-18: qty 250

## 2021-03-18 MED ORDER — EPTIFIBATIDE 20 MG/10ML IV SOLN
INTRAVENOUS | Status: AC | PRN
  Administered 2021-03-18 (×2): 1.5 mg via INTRA_ARTERIAL

## 2021-03-18 MED ORDER — ALBUTEROL SULFATE HFA 108 (90 BASE) MCG/ACT IN AERS: 1.0000 | INHALATION_SPRAY | RESPIRATORY_TRACT | Status: DC | PRN

## 2021-03-18 MED ORDER — NIMODIPINE 30 MG PO CAPS
0.0000 mg | ORAL_CAPSULE | ORAL | Status: DC
  Filled 2021-03-18: qty 2

## 2021-03-18 MED ORDER — ACETAMINOPHEN 160 MG/5ML PO SOLN: 650.0000 mg | ORAL | Status: DC | PRN

## 2021-03-18 MED ORDER — LIDOCAINE 2% (20 MG/ML) 5 ML SYRINGE
INTRAMUSCULAR | Status: DC | PRN
  Administered 2021-03-18: 80 mg via INTRAVENOUS

## 2021-03-18 MED ORDER — SUGAMMADEX SODIUM 200 MG/2ML IV SOLN
INTRAVENOUS | Status: DC | PRN
  Administered 2021-03-18: 200 mg via INTRAVENOUS

## 2021-03-18 MED ORDER — ALBUTEROL SULFATE (2.5 MG/3ML) 0.083% IN NEBU: 2.5000 mg | INHALATION_SOLUTION | RESPIRATORY_TRACT | Status: DC | PRN

## 2021-03-18 MED ORDER — ASPIRIN 81 MG PO CHEW
81.0000 mg | CHEWABLE_TABLET | Freq: Every day | ORAL | Status: DC
  Administered 2021-03-19: 81 mg via ORAL
  Filled 2021-03-18: qty 1

## 2021-03-18 MED ORDER — ROCURONIUM BROMIDE 10 MG/ML (PF) SYRINGE
PREFILLED_SYRINGE | INTRAVENOUS | Status: DC | PRN
  Administered 2021-03-18: 20 mg via INTRAVENOUS
  Administered 2021-03-18: 10 mg via INTRAVENOUS
  Administered 2021-03-18: 50 mg via INTRAVENOUS
  Administered 2021-03-18: 10 mg via INTRAVENOUS

## 2021-03-18 MED ORDER — MUPIROCIN 2 % EX OINT
1.0000 "application " | TOPICAL_OINTMENT | Freq: Two times a day (BID) | CUTANEOUS | Status: DC
  Administered 2021-03-18 – 2021-03-19 (×2): 1 via NASAL
  Filled 2021-03-18: qty 22

## 2021-03-18 MED ORDER — LACTATED RINGERS IV SOLN: INTRAVENOUS | Status: DC

## 2021-03-18 MED ORDER — ASPIRIN EC 325 MG PO TBEC
325.0000 mg | DELAYED_RELEASE_TABLET | ORAL | Status: DC
  Filled 2021-03-18: qty 1

## 2021-03-18 MED ORDER — ROSUVASTATIN CALCIUM 5 MG PO TABS
10.0000 mg | ORAL_TABLET | Freq: Every day | ORAL | Status: DC
  Administered 2021-03-18: 10 mg via ORAL
  Filled 2021-03-18: qty 2

## 2021-03-18 MED ORDER — CLOPIDOGREL BISULFATE 75 MG PO TABS: 75.0000 mg | ORAL_TABLET | Freq: Every day | ORAL | Status: DC

## 2021-03-18 MED ORDER — FENTANYL CITRATE (PF) 100 MCG/2ML IJ SOLN
25.0000 ug | INTRAMUSCULAR | Status: DC | PRN
  Administered 2021-03-18: 25 ug via INTRAVENOUS
  Administered 2021-03-18: 50 ug via INTRAVENOUS
  Administered 2021-03-18: 25 ug via INTRAVENOUS

## 2021-03-18 MED ORDER — PHENYLEPHRINE HCL-NACL 20-0.9 MG/250ML-% IV SOLN
INTRAVENOUS | Status: DC | PRN
  Administered 2021-03-18: 40 ug/min via INTRAVENOUS

## 2021-03-18 MED ORDER — HEPARIN (PORCINE) 25000 UT/250ML-% IV SOLN
600.0000 [IU]/h | INTRAVENOUS | Status: DC
  Filled 2021-03-18: qty 250

## 2021-03-18 MED ORDER — VERAPAMIL HCL 2.5 MG/ML IV SOLN
INTRA_ARTERIAL | Status: AC | PRN
  Administered 2021-03-18: 7 mL via INTRA_ARTERIAL

## 2021-03-18 MED ORDER — HEPARIN (PORCINE) 25000 UT/250ML-% IV SOLN
500.0000 [IU]/h | INTRAVENOUS | Status: DC
  Administered 2021-03-18: 500 [IU]/h via INTRAVENOUS

## 2021-03-18 MED ORDER — CHLORHEXIDINE GLUCONATE 0.12 % MT SOLN
15.0000 mL | Freq: Once | OROMUCOSAL | Status: AC
  Administered 2021-03-18: 15 mL via OROMUCOSAL
  Filled 2021-03-18: qty 15

## 2021-03-18 MED ORDER — DEXAMETHASONE SODIUM PHOSPHATE 10 MG/ML IJ SOLN
INTRAMUSCULAR | Status: DC | PRN
  Administered 2021-03-18: 5 mg via INTRAVENOUS

## 2021-03-18 MED ORDER — DIVALPROEX SODIUM ER 500 MG PO TB24
500.0000 mg | ORAL_TABLET | Freq: Every evening | ORAL | Status: DC
  Administered 2021-03-18: 500 mg via ORAL
  Filled 2021-03-18 (×2): qty 1

## 2021-03-18 MED ORDER — FENTANYL CITRATE (PF) 100 MCG/2ML IJ SOLN
INTRAMUSCULAR | Status: AC
  Filled 2021-03-18: qty 2

## 2021-03-18 MED ORDER — ORAL CARE MOUTH RINSE: 15.0000 mL | Freq: Once | OROMUCOSAL | Status: AC

## 2021-03-18 MED ORDER — VERAPAMIL HCL 2.5 MG/ML IV SOLN
INTRAVENOUS | Status: AC
  Filled 2021-03-18: qty 2

## 2021-03-18 MED ORDER — PROPOFOL 10 MG/ML IV BOLUS
INTRAVENOUS | Status: DC | PRN
  Administered 2021-03-18: 150 mg via INTRAVENOUS

## 2021-03-18 MED ORDER — CLOPIDOGREL BISULFATE 75 MG PO TABS
75.0000 mg | ORAL_TABLET | ORAL | Status: AC
  Administered 2021-03-18: 75 mg via ORAL
  Filled 2021-03-18: qty 1

## 2021-03-18 MED ORDER — LIDOCAINE HCL (PF) 1 % IJ SOLN
INTRAMUSCULAR | Status: AC | PRN
  Administered 2021-03-18: 5 mL

## 2021-03-18 MED ORDER — EPTIFIBATIDE 20 MG/10ML IV SOLN
INTRAVENOUS | Status: AC
  Filled 2021-03-18: qty 10

## 2021-03-18 MED ORDER — CLOPIDOGREL BISULFATE 75 MG PO TABS
75.0000 mg | ORAL_TABLET | Freq: Every day | ORAL | Status: DC
  Administered 2021-03-19: 75 mg via ORAL
  Filled 2021-03-18: qty 1

## 2021-03-18 MED ORDER — HEPARIN SODIUM (PORCINE) 1000 UNIT/ML IJ SOLN
INTRAMUSCULAR | Status: DC | PRN
  Administered 2021-03-18: 2000 [IU] via INTRAVENOUS

## 2021-03-18 MED ORDER — SODIUM CHLORIDE (PF) 0.9 % IJ SOLN
INTRAVENOUS | Status: AC | PRN
  Administered 2021-03-18: 200 ug via INTRA_ARTERIAL

## 2021-03-18 MED ORDER — ACETAMINOPHEN 650 MG RE SUPP: 650.0000 mg | RECTAL | Status: DC | PRN

## 2021-03-18 NOTE — H&P (Deleted)
  The note originally documented on this encounter has been moved the the encounter in which it belongs.  

## 2021-03-18 NOTE — Transfer of Care (Signed)
Immediate Anesthesia Transfer of Care Note  Patient: Lindsay Erickson  Procedure(s) Performed: [facility]  Patient Location: PACU  Anesthesia Type:General  Level of Consciousness: awake, alert  and patient cooperative  Airway & Oxygen Therapy: Patient Spontanous Breathing  Post-op Assessment: Report given to RN, Post -op Vital signs reviewed and stable and Patient moving all extremities X 4  Post vital signs: Reviewed and stable  Last Vitals:  Vitals Value Taken Time  BP 155/72 03/18/21 1245  Temp    Pulse 71 03/18/21 1251  Resp 15 03/18/21 1251  SpO2 100 % 03/18/21 1251  Vitals shown include unvalidated device data.  Last Pain:  Vitals:   03/18/21 0720  TempSrc:   PainSc: 1       Patients Stated Pain Goal: 0 (44/46/19 0122)  Complications: No notable events documented.

## 2021-03-18 NOTE — Progress Notes (Signed)
Referring Physician(s): * No referring provider recorded for this case *  Supervising Physician: [name]  Patient Status:  [facility] - In-pt  Chief Complaint:  S/p right common carotid arteriogram with endovascular treatment of right ICA superior hypophyseal aneurysm with a pipeline shield flow diverter device.   Subjective:  Pt c/o mild pain to right forearm to palpation. She states that she had numbness/tingling to right fingers post procedure that has since resolved.  She denies HA. She is requesting to eat.    Allergies: Effexor [venlafaxine], Gabapentin, Primidone, Propranolol, Latex, Codeine, and Nickel  Medications: Prior to Admission medications   Medication Sig Start Date End Date Taking? Authorizing Provider  acetaminophen (TYLENOL) 650 MG CR tablet Take 650 mg by mouth every 8 (eight) hours as needed for pain.   Yes [provider]  aspirin EC 81 MG tablet Take 81 mg by mouth daily. Swallow whole.   Yes [provider]  clopidogrel (PLAVIX) 75 MG tablet Take 37.5 mg by mouth 2 (two) times a week. Mon and Thurs 04/02/20  Yes [provider]  divalproex (DEPAKOTE ER) 500 MG 24 hr tablet Take 1 tablet (500 mg total) by mouth every evening. 07/23/20  Yes Cameron Sprang, MD  nicotine (NICODERM CQ - DOSED IN MG/24 HOURS) 14 mg/24hr patch Place 14 mg onto the skin daily as needed (nicotine cravings).   Yes [provider]  rosuvastatin (CRESTOR) 10 MG tablet Take 1 tablet (10 mg total) by mouth at bedtime. 07/19/20  Yes Revankar, Reita Cliche, MD  acetaminophen (TYLENOL) 500 MG tablet Take 500 mg by mouth every 8 (eight) hours as needed for headache.    [provider]  albuterol (VENTOLIN HFA) 108 (90 Base) MCG/ACT inhaler Inhale 1-2 puffs into the lungs as needed for wheezing or shortness of breath. 12/19/20   [provider]     Vital Signs: BP (!) 97/56   Pulse 67   Temp 98 F (36.7 C) (Oral)   Resp 15   Ht 5'  2" (1.575 m)   Wt 118 lb (53.5 kg)   SpO2 98%   BMI 21.58 kg/m   Physical Exam Vitals reviewed.  Constitutional:      Appearance: She is ill-appearing.  HENT:     Head: Normocephalic and atraumatic.  Cardiovascular:     Rate and Rhythm: Normal rate and regular rhythm.     Comments: +1 palpable pulse to R radial puncture site Pulmonary:     Effort: Pulmonary effort is normal. No respiratory distress.  Skin:    General: Skin is warm and dry.     Comments: Pt right forearm is firm and tender on palpation w/ mild ecchymosis.    Neurological:     Mental Status: She is alert and oriented to person, place, and time.     Coordination: Coordination abnormal.     Comments: Alert, aware and oriented X 3 Speech and comprehension is intact.  Pt is tremulous to upper extremities, head and neck that is baseline No facial droop noted Can spontaneously move all 4 extremities. Hand grip strength equal bilaterally. Dorsiflexion 5/5 bilaterally,  Plantar flection 5/5 bilaterally.  Fine motor and coordination slow but intact.  Gait not assessed Romberg not assessed Heel to toe not assessed Distal pulses not assessed   Psychiatric:        Mood and Affect: Mood normal.        Behavior: Behavior normal.  Thought Content: Thought content normal.        Judgment: Judgment normal.    Imaging: No results found.  Labs:  CBC: Recent Labs    05/14/20 0625 05/15/20 0219 05/15/20 1450 01/16/21 0000 03/18/21 0730  WBC 5.4 4.6  --  6.6 5.2  HGB 11.6* 6.2* 10.1* 12.8 12.1  HCT 36.2 18.1* 29.9* 37.8 37.7  PLT 261 171  --  333 255    COAGS: Recent Labs    04/11/20 0639 05/14/20 0625 03/18/21 0730  INR 1.1 1.0 1.0  APTT 36 35  --     BMP: Recent Labs    04/11/20 0639 05/14/20 0625 05/15/20 0219 01/16/21 0000 03/18/21 0730  NA 139 139 137 142 139  K 3.4* 3.6 4.0 4.0 3.7  CL 102 102 111 103 105  CO2 24 25 19* 24 24  GLUCOSE 109* 113* 142* 101* 90  BUN 26* 11 11 12 18    CALCIUM 9.4 9.3 7.1* 9.6 9.3  CREATININE 0.69 0.63 0.54 0.63 0.56  GFRNONAA >60 >60 >60  --  >60    LIVER FUNCTION TESTS: Recent Labs    01/16/21 0000  BILITOT <0.2  AST 16  ALT 10  ALKPHOS 116  PROT 6.9  ALBUMIN 4.3    Assessment and Plan: S/p right common carotid arteriogram with endovascular treatment of right ICA superior hypophyseal aneurysm with a pipeline shield flow diverter device.  -Pt has swelling, firmness and mild ecchymosis to right forearm, just above right radial puncture site. R radial pulse +1  -Heat pack placed to right forearm -Heparin turned off for 1 hour -Continue to monitor right radial puncture site -Stat aPPT ordered  IR to continue to follow Please call IIR with questions/concerns   Electronically Signed: Tyson Alias, NP 03/18/2021, 3:10 PM   I spent a total of 30 minutes at the the patient's bedside AND on the patient's hospital floor or unit, greater than 50% of which was counseling/coordinating care for s/p right common carotid arteriogram with endovascular treatment of right ICA superior hypophyseal aneurysm with a pipeline shield flow diverter device.

## 2021-03-18 NOTE — Progress Notes (Signed)
ANTICOAGULATION CONSULT NOTE Pharmacy Consult:  Heparin Indication:  Post coiling  Allergies  Allergen Reactions   Effexor [Venlafaxine] Other (See Comments)    Irritable/hyper   Gabapentin Other (See Comments)    Insomnia   Primidone Other (See Comments)    Ineffective    Propranolol Swelling    Can't take beta blockers. Didn't help tremors.   Latex Itching   Codeine Itching   Nickel Itching and Rash    Patient Measurements: Height: 5\' 2"  (157.5 cm) Weight: 53.5 kg (118 lb) IBW/kg (Calculated) : 50.1 Heparin Dosing Weight: 53 kg  Vital Signs: Temp: 98.3 F (36.8 C) (11/14 1600) Temp Source: Oral (11/14 1600) BP: 129/66 (11/14 1930) Pulse Rate: 63 (11/14 1930)  Labs: Recent Labs    03/18/21 0730 03/18/21 1545 03/18/21 1844  HGB 12.1  --   --   HCT 37.7  --   --   PLT 255  --   --   APTT  --  121*  --   LABPROT 12.8  --   --   INR 1.0  --   --   HEPARINUNFRC  --   --  <0.10*  CREATININE 0.56  --   --      Estimated Creatinine Clearance: 57.7 mL/min (by C-G formula based on SCr of 0.56 mg/dL).   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   Arthritis    knee   Benign familial tremor 06/17/2017   Brain aneurysm 05/14/2020   Cardiac murmur 07/11/2020   no current problems   Chest discomfort 06/22/2018   Chronic tension-type headache, not intractable 05/10/2014   Constipation    03/15/21 - improved   Depression    Diplopia 09/30/2017   Dyslipidemia    Essential tremor    History of blood transfusion    2 units after a surgical procedure   History of kidney stones    passed stones   HPV in female    Memory loss    mild   Migraine without aura and without status migrainosus, not intractable 06/17/2017   Mixed dyslipidemia 06/22/2018   Partial epilepsy with impairment of consciousness ([place]) 07/26/2013   Peripheral  vascular disease ([place])    blood clot in leg after being kicked by a horse - age 60   Pneumonia    PONV (postoperative nausea and vomiting)    just 1 time after breast biopsy and headache, no anesthesia problems with other surgeries   Seizure disorder ([place])    last seizure 1980s- controlled with meds   Tremor, essential      Assessment: 60 YOF presented for carotid arteriogram with endovascular treatment of wide neck R IC aneurysm, diverter device on 03/18/21.  Patient was started on heparin in PACU.  Pharmacy consulted for heparin monitoring.  PM heparin level < 0.10  Goal of Therapy:  Heparin level 0.1-0.25 units/ml Monitor platelets by anticoagulation protocol: Yes   Plan:  Increase heparin to 600 units / hr Heparin level in AM before dc  Thank you Anette Guarneri, PharmD 03/18/2021, 7:58 PM

## 2021-03-18 NOTE — Progress Notes (Signed)
1427: Pt. Arrived to 4NICU from PACU. R extremity noted to be noticeably swollen and painful to touch on forearm.   1430: Dr. Estanislado Pandy at bedside, RN instructed to apply heat pack, draw stat PTT, hold heparin for one hour, redraw PTT in one hour. Hematoma borders marked, TR band deflation paused.   1545: RN instructed by Dr. Estanislado Pandy to apply manual BP cuff to pt arm. BP cuff applied for 15 mins per order on hematoma site. Hematoma noticed to be softer following BP cuff administration.   1700: TR band successfully removed. No sign of further complications at site. Will continue monitor.

## 2021-03-18 NOTE — Progress Notes (Signed)
ANTICOAGULATION CONSULT NOTE - Initial Consult  Pharmacy Consult:  Heparin Indication:  Post coiling  Allergies  Allergen Reactions   Effexor [Venlafaxine] Other (See Comments)    Irritable/hyper   Gabapentin Other (See Comments)    Insomnia   Primidone Other (See Comments)    Ineffective    Propranolol Swelling    Can't take beta blockers. Didn't help tremors.   Latex Itching   Codeine Itching   Nickel Itching and Rash    Patient Measurements: Height: 5\' 2"  (157.5 cm) Weight: 53.5 kg (118 lb) IBW/kg (Calculated) : 50.1 Heparin Dosing Weight: 53 kg  Vital Signs: Temp: 98 F (36.7 C) (11/14 1427) Temp Source: Oral (11/14 1427) BP: 133/61 (11/14 1427) Pulse Rate: 69 (11/14 1427)  Labs: Recent Labs    03/18/21 0730  HGB 12.1  HCT 37.7  PLT 255  LABPROT 12.8  INR 1.0  CREATININE 0.56    Estimated Creatinine Clearance: 57.7 mL/min (by C-G formula based on SCr of 0.56 mg/dL).   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   Arthritis    knee   Benign familial tremor 06/17/2017   Brain aneurysm 05/14/2020   Cardiac murmur 07/11/2020   no current problems   Chest discomfort 06/22/2018   Chronic tension-type headache, not intractable 05/10/2014   Constipation    03/15/21 - improved   Depression    Diplopia 09/30/2017   Dyslipidemia    Essential tremor    History of blood transfusion    2 units after a surgical procedure   History of kidney stones    passed stones   HPV in female    Memory loss    mild   Migraine without aura and without status migrainosus, not intractable 06/17/2017   Mixed dyslipidemia 06/22/2018   Partial epilepsy with impairment of consciousness ([place]) 07/26/2013   Peripheral vascular disease ([place])    blood clot in leg after being kicked by a horse - age 60   Pneumonia    PONV (postoperative nausea and  vomiting)    just 1 time after breast biopsy and headache, no anesthesia problems with other surgeries   Seizure disorder ([place])    last seizure 1980s- controlled with meds   Tremor, essential      Assessment: 29 YOF presented for carotid arteriogram with endovascular treatment of wide neck R IC aneurysm, diverter device on 03/18/21.  Patient was started on heparin in PACU.  Pharmacy consulted for heparin monitoring.  Goal of Therapy:  Heparin level 0.1-0.25 units/ml Monitor platelets by anticoagulation protocol: Yes   Plan:  Continue heparin infusion at 500 units/hr Check 6 hr heparin level D/C level in AM per protocol  Algenis Ballin D. Mina Marble, PharmD, BCPS, [place] 03/18/2021, 2:42 PM

## 2021-03-18 NOTE — Anesthesia Procedure Notes (Signed)
Procedure Name: Intubation Date/Time: 03/18/2021 8:52 AM Performed by: Lorie Phenix, CRNA Pre-anesthesia Checklist: Patient identified, Emergency Drugs available, Suction available and Patient being monitored Patient Re-evaluated:Patient Re-evaluated prior to induction Oxygen Delivery Method: Circle system utilized Preoxygenation: Pre-oxygenation with 100% oxygen Induction Type: IV induction Ventilation: Mask ventilation without difficulty Laryngoscope Size: Mac and 3 Grade View: Grade I Tube type: Oral Tube size: 6.5 mm Number of attempts: 1 Airway Equipment and Method: Stylet and Oral airway Placement Confirmation: ETT inserted through vocal cords under direct vision, positive ETCO2 and breath sounds checked- equal and bilateral Secured at: 22 cm Tube secured with: Tape Dental Injury: Teeth and Oropharynx as per pre-operative assessment

## 2021-03-18 NOTE — H&P (Signed)
Chief Complaint: Patient was seen in consultation today for cerebral arteriogram with R ICA/paraophthalmic artery aneurysm embolization    Supervising Physician: [name]  Patient Status: [facility] - Out-pt  History of Present Illness: CARLETTA Erickson is a 60 y.o. female   Known to NIR LICA/paraophthalmic artery aneurysm embolization 05/14/20 Flow diversion and coiling performed - Dr Estanislado Pandy Post procedure the patient complained of right groin pain with tenderness and ecchymosis. Evaluation of the right groin with ultrasound demonstrated the presence of a small pseudoaneurysm, for which she underwent repair by vascular surgery. Doing well at this point Occasional low grade pain if stands long period  MRA 11/16/2020: IMPRESSION: 1. 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 2. 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 3. No ischemic or inflammatory complication seen in the brain.  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   Arthritis    knee   Benign familial tremor 06/17/2017   Brain aneurysm 05/14/2020   Cardiac murmur 07/11/2020   no current problems   Chest discomfort 06/22/2018   Chronic tension-type headache, not intractable 05/10/2014   Constipation    03/15/21 - improved   Depression    Diplopia 09/30/2017   Dyslipidemia    Essential tremor    History of blood transfusion    2 units after a surgical procedure   History of kidney stones    passed stones   HPV in female    Memory loss    mild   Migraine without aura and without status migrainosus, not intractable  06/17/2017   Mixed dyslipidemia 06/22/2018   Partial epilepsy with impairment of consciousness ([place]) 07/26/2013   Peripheral vascular disease ([place])    blood clot in leg after being kicked by a horse - age 38   Pneumonia    PONV (postoperative nausea and vomiting)    just 1 time after breast biopsy and headache, no anesthesia problems with other surgeries   Seizure disorder ([place])    last seizure 1980s- controlled with meds   Tremor, essential     Past Surgical History:  Procedure Laterality Date   APPLICATION OF WOUND VAC Right 05/14/2020   Procedure: APPLICATION OF WOUND VAC;  Surgeon: Cherre Robins, MD;  Location: [place];  Service: Vascular;  Laterality: Right;   BREAST BIOPSY Left    X3   CARPAL TUNNEL RELEASE     CATARACT EXTRACTION Bilateral    FALSE ANEURYSM REPAIR Right 05/14/2020   Procedure: REPAIR FALSE ANEURYSM;  Surgeon: Cherre Robins, MD;  Location: [facility] OR;  Service: Vascular;  Laterality: Right;   IR ANGIO INTRA EXTRACRAN SEL COM CAROTID INNOMINATE BILAT MOD SED  01/12/2020   IR ANGIO INTRA EXTRACRAN SEL INTERNAL CAROTID UNI L MOD SED  05/14/2020   IR ANGIO VERTEBRAL SEL VERTEBRAL BILAT MOD SED  01/12/2020   IR ANGIOGRAM FOLLOW UP STUDY  05/14/2020   IR ANGIOGRAM FOLLOW UP STUDY  05/14/2020   IR RADIOLOGIST EVAL & MGMT  06/06/2020   IR RADIOLOGIST EVAL & MGMT  12/03/2020   IR TRANSCATH/EMBOLIZ  05/14/2020   IR US GUIDE  VASC ACCESS RIGHT  01/12/2020   RADIOLOGY WITH ANESTHESIA N/A 05/14/2020   Procedure: IR WITH ANESTHESIA ANEURYSM EMBOLIZATION;  Surgeon: [name], MD;  Location: [place];  Service: Radiology;  Laterality: N/A;   SALPINGOOPHORECTOMY     both ovaries removed by laparoscopic   VAGINAL HYSTERECTOMY      Allergies: Effexor [venlafaxine], Gabapentin, Primidone, Propranolol, Latex, Codeine, and Nickel  Medications: Prior to Admission medications   Medication Sig Start Date End Date Taking? Authorizing Provider  acetaminophen (TYLENOL) 500  MG tablet Take 500 mg by mouth every 8 (eight) hours as needed for headache.    [provider]  acetaminophen (TYLENOL) 650 MG CR tablet Take 650 mg by mouth every 8 (eight) hours as needed for pain.    [provider]  albuterol (VENTOLIN HFA) 108 (90 Base) MCG/ACT inhaler Inhale 1-2 puffs into the lungs as needed for wheezing or shortness of breath. 12/19/20   [provider]  aspirin EC 81 MG tablet Take 81 mg by mouth daily. Swallow whole.    [provider]  clopidogrel (PLAVIX) 75 MG tablet Take 37.5 mg by mouth 2 (two) times a week. Mon and Thurs 04/02/20   [provider]  divalproex (DEPAKOTE ER) 500 MG 24 hr tablet Take 1 tablet (500 mg total) by mouth every evening. 07/23/20   Cameron Sprang, MD  nicotine (NICODERM CQ - DOSED IN MG/24 HOURS) 14 mg/24hr patch Place 14 mg onto the skin daily as needed (nicotine cravings).    [provider]  rosuvastatin (CRESTOR) 10 MG tablet Take 1 tablet (10 mg total) by mouth at bedtime. 07/19/20   Revankar, Reita Cliche, MD     Family History  Problem Relation Age of Onset   Heart disease Mother    Stroke Mother    Tremor Mother    Heart disease Father    Tremor Father    Atrial fibrillation Sister    Diabetes Brother    Diabetes Maternal Grandmother    Heart attack Maternal Uncle    Heart attack Maternal Uncle     Social History   Socioeconomic History   Marital status: Married    Spouse name: Not on file   Number of children: Not on file   Years of education: Not on file   Highest education level: Not on file  Occupational History   Not on file  Tobacco Use   Smoking status: Every Day    Packs/day: 0.25    Types: Cigarettes   Smokeless tobacco: Never   Tobacco comments:    2-4 cigarettes per day.  Patient states "trying to quit"  Vaping Use   Vaping Use: Never used  Substance and Sexual Activity   Alcohol use: Not Currently    Comment: rare   Drug use: Never   Sexual  activity: Not on file    Comment: Hysterectomy  Other Topics Concern   Not on file  Social History Narrative   One story home mobile home   Right handed   Drinks caffeine on occasion   Social Determinants of Health   Financial Resource Strain: Not on file  Food Insecurity: Not on file  Transportation Needs: Not on file  Physical Activity: Not on file  Stress: Not on file  Social Connections: Not on file      Review of Systems: A 12 point ROS discussed and pertinent positives are indicated in the HPI above.  All other systems are negative.  Review of Systems  Constitutional:  Negative for activity change, fatigue and fever.  HENT:  Negative for tinnitus and trouble swallowing.   Eyes:  Negative for visual disturbance.  Respiratory:  Negative for cough and shortness of breath.   Cardiovascular:  Negative for chest pain.  Gastrointestinal:  Negative for abdominal pain, nausea and vomiting.  Musculoskeletal:  Negative for back pain and gait problem.  Neurological:  Negative for dizziness, tremors, seizures, syncope, facial asymmetry, speech difficulty, weakness, light-headedness, numbness and headaches.  Psychiatric/Behavioral:  Negative for behavioral problems and confusion.    Vital Signs: There were no vitals taken for this visit.  Physical Exam Vitals reviewed.  HENT:     Mouth/Throat:     Mouth: Mucous membranes are moist.  Eyes:     Extraocular Movements: Extraocular movements intact.  Cardiovascular:     Rate and Rhythm: Normal rate and regular rhythm.     Heart sounds: Normal heart sounds.  Pulmonary:     Effort: Pulmonary effort is normal.     Breath sounds: Normal breath sounds. No wheezing.  Abdominal:     Palpations: Abdomen is soft.     Tenderness: There is no abdominal tenderness.  Musculoskeletal:        General: No swelling or tenderness. Normal range of motion.     Right lower leg: No edema.     Left lower leg: No edema.  Skin:    General: Skin  is warm.  Neurological:     Mental Status: She is alert and oriented to person, place, and time.  Psychiatric:        Mood and Affect: Mood normal.        Behavior: Behavior normal.        Thought Content: Thought content normal.        Judgment: Judgment normal.    Imaging: No results found.  Labs:  CBC: Recent Labs    04/11/20 0639 05/14/20 0625 05/15/20 0219 05/15/20 1450 01/16/21 0000  WBC 6.2 5.4 4.6  --  6.6  HGB 12.7 11.6* 6.2* 10.1* 12.8  HCT 39.8 36.2 18.1* 29.9* 37.8  PLT 248 261 171  --  333    COAGS: Recent Labs    04/11/20 0639 05/14/20 0625  INR 1.1 1.0  APTT 36 35    BMP: Recent Labs    04/11/20 0639 05/14/20 0625 05/15/20 0219 01/16/21 0000  NA 139 139 137 142  K 3.4* 3.6 4.0 4.0  CL 102 102 111 103  CO2 24 25 19* 24  GLUCOSE 109* 113* 142* 101*  BUN 26* 11 11 12   CALCIUM 9.4 9.3 7.1* 9.6  CREATININE 0.69 0.63 0.54 0.63  GFRNONAA >60 >60 >60  --     LIVER FUNCTION TESTS: Recent Labs    01/16/21 0000  BILITOT <0.2  AST 16  ALT 10  ALKPHOS 116  PROT 6.9  ALBUMIN 4.3    TUMOR MARKERS: No results for input(s): AFPTM, CEA, CA199, CHROMGRNA in the last 8760 hours.  Assessment and Plan:  Pt is scheduled today for Right internal carotid artery/paraophthalmic artery aneurysm embolization Risks and benefits of cerebral angiogram with intervention were discussed with the patient including, but not limited to bleeding, infection, vascular injury, contrast induced renal failure, stroke or even death.  This interventional procedure involves the use of X-rays and because of the nature of the planned procedure, it is possible that we will have prolonged use of X-ray fluoroscopy.  Potential radiation risks to you include (but are not limited  to) the following: - A slightly elevated risk for cancer  several years later in life. This risk is typically less than 0.5% percent. This risk is low in comparison to the normal incidence of  human cancer, which is 33% for women and 50% for men according to the [place]. - Radiation induced injury can include skin redness, resembling a rash, tissue breakdown / ulcers and hair loss (which can be temporary or permanent).   The likelihood of either of these occurring depends on the difficulty of the procedure and whether you are sensitive to radiation due to previous procedures, disease, or genetic conditions.   IF your procedure requires a prolonged use of radiation, you will be notified and given written instructions for further action.  It is your responsibility to monitor the irradiated area for the 2 weeks following the procedure and to notify your physician if you are concerned that you have suffered a radiation induced injury.    All of the patient's questions were answered, patient is agreeable to proceed.  Consent signed and in chart.  Pt is aware she will be admitted after procedure for overnight observation Plan for DC in am if stable She is agreeable    Thank you for this interesting consult.  I greatly enjoyed meeting JAZARIA JARECKI and look forward to participating in their care.  A copy of this report was sent to the requesting provider on this date.  Electronically Signed: Lavonia Drafts, PA-C 03/18/2021, 7:26 AM   I spent a total of    25 Minutes in face to face in clinical consultation, greater than 50% of which was counseling/coordinating care for R ICA praophthalmic artery aneurysm embolization

## 2021-03-18 NOTE — Anesthesia Procedure Notes (Signed)
Arterial Line Insertion Start/End11/14/2022 8:20 AM, 03/18/2021 8:25 AM Performed by: CRNA  Patient location: Pre-op. Preanesthetic checklist: patient identified, IV checked, site marked, risks and benefits discussed, surgical consent, monitors and equipment checked, pre-op evaluation, timeout performed and anesthesia consent Lidocaine 1% used for infiltration Left, radial was placed Catheter size: 20 G Hand hygiene performed  and maximum sterile barriers used   Attempts: 1 Procedure performed without using ultrasound guided technique. Following insertion, Biopatch and dressing applied. Post procedure assessment: normal  Patient tolerated the procedure well with no immediate complications.

## 2021-03-18 NOTE — Anesthesia Preprocedure Evaluation (Addendum)
Anesthesia Evaluation  Patient identified by MRN, date of birth, ID band  Reviewed: Allergy & Precautions, NPO status , Patient's Chart, lab work & pertinent test results  History of Anesthesia Complications (+) PONV  Airway Mallampati: II  TM Distance: >3 FB     Dental   Pulmonary pneumonia, Current Smoker and Patient abstained from smoking.,    breath sounds clear to auscultation       Cardiovascular + Peripheral Vascular Disease   Rhythm:Regular Rate:Normal  History noted Dr. Nyoka Cowden   Neuro/Psych  Headaches, Seizures -,     GI/Hepatic Neg liver ROS, GERD  ,  Endo/Other  negative endocrine ROS  Renal/GU negative Renal ROS     Musculoskeletal  (+) Arthritis ,   Abdominal   Peds  Hematology   Anesthesia Other Findings   Reproductive/Obstetrics                           Anesthesia Physical Anesthesia Plan  ASA: 3  Anesthesia Plan: General   Post-op Pain Management:    Induction: Intravenous  PONV Risk Score and Plan: 2 and Ondansetron, Dexamethasone and Midazolam  Airway Management Planned: Oral ETT  Additional Equipment: Arterial line  Intra-op Plan:   Post-operative Plan: Extubation in OR  Informed Consent: I have reviewed the patients History and Physical, chart, labs and discussed the procedure including the risks, benefits and alternatives for the proposed anesthesia with the patient or authorized representative who has indicated his/her understanding and acceptance.     Dental advisory given  Plan Discussed with: Anesthesiologist and CRNA  Anesthesia Plan Comments:       Anesthesia Quick Evaluation

## 2021-03-18 NOTE — Procedures (Signed)
S/P RT Common carotid arteriogram followed b y endovascular treatment of if wide neck approx 73mm x 5.5 mm RT ICA sup hypophyseal aneurysm with a pipeline shield flow diverter device. Rt Rad approach. Post CTbrain neg for hemorrhage. Extubated . C/O a slight H/A. No Nausea.  Pupils 58mm RT = LT. No facial asymmetry. Tongue midline. Moves all 4s equally. Rt rad pulse dopplerable. S.Adriene Knipfer MD

## 2021-03-18 NOTE — Sedation Documentation (Signed)
SBAR given to [employer], Therapist, sports in PACU, [place] [facility].

## 2021-03-19 ENCOUNTER — Other Ambulatory Visit: Payer: Self-pay | Admitting: Radiology

## 2021-03-19 ENCOUNTER — Encounter (HOSPITAL_COMMUNITY): Payer: Self-pay | Admitting: Interventional Radiology

## 2021-03-19 DIAGNOSIS — G43009 Migraine without aura, not intractable, without status migrainosus: Secondary | ICD-10-CM

## 2021-03-19 LAB — CBC WITH DIFFERENTIAL/PLATELET
Abs Immature Granulocytes: 0.02 10*3/uL (ref 0.00–0.07)
Basophils Absolute: 0 10*3/uL (ref 0.0–0.1)
Basophils Relative: 0 %
Eosinophils Absolute: 0 10*3/uL (ref 0.0–0.5)
Eosinophils Relative: 0 %
HCT: 29.8 % — ABNORMAL LOW (ref 36.0–46.0)
Hemoglobin: 9.8 g/dL — ABNORMAL LOW (ref 12.0–15.0)
Immature Granulocytes: 1 %
Lymphocytes Relative: 27 %
Lymphs Abs: 1.2 10*3/uL (ref 0.7–4.0)
MCH: 31 pg (ref 26.0–34.0)
MCHC: 32.9 g/dL (ref 30.0–36.0)
MCV: 94.3 fL (ref 80.0–100.0)
Monocytes Absolute: 0.2 10*3/uL (ref 0.1–1.0)
Monocytes Relative: 4 %
Neutro Abs: 2.9 10*3/uL (ref 1.7–7.7)
Neutrophils Relative %: 68 %
Platelets: 210 10*3/uL (ref 150–400)
RBC: 3.16 MIL/uL — ABNORMAL LOW (ref 3.87–5.11)
RDW: 14.5 % (ref 11.5–15.5)
WBC: 4.3 10*3/uL (ref 4.0–10.5)
nRBC: 0 % (ref 0.0–0.2)

## 2021-03-19 LAB — BASIC METABOLIC PANEL
Anion gap: 7 (ref 5–15)
BUN: 11 mg/dL (ref 8–23)
CO2: 21 mmol/L — ABNORMAL LOW (ref 22–32)
Calcium: 7.9 mg/dL — ABNORMAL LOW (ref 8.9–10.3)
Chloride: 108 mmol/L (ref 98–111)
Creatinine, Ser: 0.45 mg/dL (ref 0.44–1.00)
GFR, Estimated: >60 mL/min (ref >60)
Glucose, Bld: 118 mg/dL — ABNORMAL HIGH (ref 70–99)
Potassium: 3.6 mmol/L (ref 3.5–5.1)
Sodium: 136 mmol/L (ref 135–145)

## 2021-03-19 LAB — HEPARIN LEVEL (UNFRACTIONATED): Heparin Unfractionated: 0.17 IU/mL — ABNORMAL LOW (ref 0.30–0.70)

## 2021-03-19 MED ORDER — HEPARIN (PORCINE) 25000 UT/250ML-% IV SOLN
600.0000 [IU]/h | INTRAVENOUS | Status: DC
  Filled 2021-03-19: qty 250

## 2021-03-19 NOTE — Discharge Summary (Signed)
Patient ID: KAYANI RAPAPORT MRN: [id number] DOB/AGE: [id number] 62 y.o.  Admit date: 03/18/2021 Discharge date: 03/19/2021  Supervising Physician: [name]  Patient Status: [facility] - In-pt  Admission Diagnoses:   Left ICA paraophthalmic aneurysm  Discharge Diagnoses:  Active Problems:   Brain aneurysm   Discharged Condition: stable  Hospital Course: 61 y.o. female inpatient. History of  headaches with visual pain sound to have a Left ICA paraophthalmic aneurysm.  S/p embolization using stent assisted angioplasty via right femoral approach 4.26.83; complicated by development of right femoral pseudoaneurysm s/p surgical repair 1.10.22 by Dr. Stanford Breed. Presented to [facility] on 11.14.22 for right common carotid arteriogram with endovascular treatment of right ICA superior hypophyseal aneurysm with a pipeline shield flow diverter device. The patient tolerated this well.  She was admitted overnight for observation and ongoing management post-procedure. She followed appropriate recommendations for best rest without issue. She tolerated a regular diet, and are able to ambulate down the hall. She has been able to void on own. Ms Tuft was assessed at bedside this AM alongside Dr. Estanislado Pandy. She denies any additional complaints. Her neuro exam in stable compared to admission. Her procedure site is found intact. Superior to the access site there with improvement in swelling,, firmness with  mild ecchymosis. Radial pulse +1. This right radial access is stable with no new signs of bleeding or extension.   Labs are stable this AM. The patient is otherwise doing well and stable for discharge home. She is to continue with aspirin 81 mg and Brilinta 37.5 mg BID.  She will be seen in follow-up with Dr. Estanislado Pandy. In 2 weeks  She are aware they will hear from scheduler with date and time of appointment. Patient given discharge instructions.     Consults: None  Significant Diagnostic Studies: No  results found.  Treatments: observation, anticoagulation:  Discharge Exam: Blood pressure (!) 128/52, pulse 62, temperature 98.9 F (37.2 C), temperature source Oral, resp. rate 17, height 5\' 2"  (1.575 m), weight 118 lb (53.5 kg), SpO2 100 %. Physical Exam Vitals and nursing note reviewed.  Constitutional:      Appearance: She is well-developed.  HENT:     Head: Normocephalic and atraumatic.  Eyes:     Conjunctiva/sclera: Conjunctivae normal.  Cardiovascular:     Rate and Rhythm: Normal rate and regular rhythm.  Pulmonary:     Effort: Pulmonary effort is normal.  Musculoskeletal:     Cervical back: Normal range of motion.  Neurological:     Mental Status: She is alert and oriented to person, place, and time.     Comments: Alert, aware and oriented X 3 Speech and comprehension is intact.  PERRL bilaterally No facial droop noted Tongue midline Can spontaneously move all 4 extremities.  Hand grip strength equal bilaterally. No drift noted. Plantar flection 5/5 bilaterally.  Right lower extremity drift (pronator) Fine motor and coordination slow but intact.  Tremors noted to be at baseline.  Right radial pulse palpable +1  Speech, cognition and language  are generally intact.  Comprehension and fluency are normal.  Judgment and insight norma Gait not assessed Romberg not assessed Heel to toe not assessed Distal pulses not assessed     Disposition: Good,  To home with self care.   Allergies as of 03/19/2021       Reactions   Effexor [venlafaxine] Other (See Comments)   Irritable/hyper   Gabapentin Other (See Comments)   Insomnia   Primidone Other (See Comments)   Ineffective  Propranolol Swelling   Can't take beta blockers. Didn't help tremors.   Latex Itching   Codeine Itching   Nickel Itching, Rash        Medication List     TAKE these medications    acetaminophen 500 MG tablet Commonly known as: TYLENOL Take 500 mg by mouth every 8 (eight)  hours as needed for headache.   acetaminophen 650 MG CR tablet Commonly known as: TYLENOL Take 650 mg by mouth every 8 (eight) hours as needed for pain.   albuterol 108 (90 Base) MCG/ACT inhaler Commonly known as: VENTOLIN HFA Inhale 1-2 puffs into the lungs as needed for wheezing or shortness of breath.   aspirin EC 81 MG tablet Take 81 mg by mouth daily. Swallow whole.   clopidogrel 75 MG tablet Commonly known as: PLAVIX Take 37.5 mg by mouth 2 (two) times a week. Mon and Thurs   divalproex 500 MG 24 hr tablet Commonly known as: DEPAKOTE ER Take 1 tablet (500 mg total) by mouth every evening.   nicotine 14 mg/24hr patch Commonly known as: NICODERM CQ - dosed in mg/24 hours Place 14 mg onto the skin daily as needed (nicotine cravings).   rosuvastatin 10 MG tablet Commonly known as: CRESTOR Take 1 tablet (10 mg total) by mouth at bedtime.        Follow-up Information     [name], MD Follow up in 2 week(s).   Specialties: Interventional Radiology, Radiology Contact information: [address] [place] [address] [place] [address] [phone]                  Electronically Signed: Jacqualine Mau, NP 03/19/2021, 1:08 PM   I have spent Greater Than 30 Minutes discharging Lindsay Erickson.

## 2021-03-19 NOTE — Discharge Instructions (Addendum)
Activity Rest today, increase activity slowly. Continue taking 37.5 mg twice daily. Continue taking Aspirin 81 mg once daily. No bending, stooping, or lifting more than 10 pounds for 2 weeks. No heavy coughing while turning head for 2 weeks.  No driving self for 2 weeks. Stay hydrated by drinking plenty of water. Apply warm wash cloth to radial hematoma.

## 2021-03-19 NOTE — Progress Notes (Signed)
ANTICOAGULATION CONSULT NOTE Pharmacy Consult:  Heparin Indication:  Post coiling  Allergies  Allergen Reactions   Effexor [Venlafaxine] Other (See Comments)    Irritable/hyper   Gabapentin Other (See Comments)    Insomnia   Primidone Other (See Comments)    Ineffective    Propranolol Swelling    Can't take beta blockers. Didn't help tremors.   Latex Itching   Codeine Itching   Nickel Itching and Rash    Patient Measurements: Height: 5\' 2"  (157.5 cm) Weight: 53.5 kg (118 lb) IBW/kg (Calculated) : 50.1 Heparin Dosing Weight: 53 kg  Vital Signs: Temp: 98.6 F (37 C) (11/15 0000) Temp Source: Oral (11/15 0000) BP: 145/53 (11/15 0300) Pulse Rate: 55 (11/15 0315)  Labs: Recent Labs    03/18/21 0730 03/18/21 1545 03/18/21 1844 03/19/21 0237  HGB 12.1  --   --  9.8*  HCT 37.7  --   --  29.8*  PLT 255  --   --  210  APTT  --  121*  --   --   LABPROT 12.8  --   --   --   INR 1.0  --   --   --   HEPARINUNFRC  --   --  <0.10* 0.17*  CREATININE 0.56  --   --   --      Estimated Creatinine Clearance: 57.7 mL/min (by C-G formula based on SCr of 0.56 mg/dL).   Assessment: 42 YOF presented for carotid arteriogram with endovascular treatment of wide neck R IC aneurysm, diverter device on 03/18/21.  Patient was started on heparin in PACU.  Pharmacy consulted for heparin monitoring.  Heparin level therapeutic (0.17) on gtt at 600 units/hr.   Goal of Therapy:  Heparin level 0.1-0.25 units/ml Monitor platelets by anticoagulation protocol: Yes   Plan:  Continue heparin at 600 units / hr Heparin to be turned off at 0800  Sherlon Handing, PharmD, BCPS Please see amion for complete clinical pharmacist phone list 03/19/2021, 3:36 AM

## 2021-03-21 LAB — PLATELET INHIBITION P2Y12

## 2021-03-21 NOTE — Anesthesia Postprocedure Evaluation (Signed)
Anesthesia Post Note  Patient: Lindsay Erickson  Procedure(s) Performed: [facility]     Patient location during evaluation: PACU Anesthesia Type: General Level of consciousness: awake and patient cooperative Pain management: pain level controlled Vital Signs Assessment: post-procedure vital signs reviewed and stable Respiratory status: spontaneous breathing, nonlabored ventilation, respiratory function stable and patient connected to nasal cannula oxygen Cardiovascular status: blood pressure returned to baseline and stable Postop Assessment: no apparent nausea or vomiting Anesthetic complications: no   No notable events documented.  Last Vitals:  Vitals:   03/19/21 1300 03/19/21 1400  BP: (!) 127/49 (!) 121/53  Pulse: 62 66  Resp: 14 18  Temp:    SpO2: 100% 100%    Last Pain:  Vitals:   03/19/21 1200  TempSrc: Oral  PainSc: 0-No pain                 Pina Sirianni

## 2021-04-10 DIAGNOSIS — N3001 Acute cystitis with hematuria: Secondary | ICD-10-CM | POA: Diagnosis not present

## 2021-04-10 DIAGNOSIS — Z6821 Body mass index (BMI) 21.0-21.9, adult: Secondary | ICD-10-CM | POA: Diagnosis not present

## 2021-04-12 ENCOUNTER — Other Ambulatory Visit (HOSPITAL_COMMUNITY): Payer: Self-pay | Admitting: Radiology

## 2021-04-12 ENCOUNTER — Other Ambulatory Visit: Payer: Self-pay

## 2021-04-12 ENCOUNTER — Other Ambulatory Visit (HOSPITAL_COMMUNITY)
Admission: RE | Admit: 2021-04-12 | Discharge: 2021-04-12 | Disposition: A | Discharge status: Home / Self Care | Payer: Medicare HMO | Source: Ambulatory Visit | Attending: Interventional Radiology | Admitting: Interventional Radiology

## 2021-04-12 ENCOUNTER — Ambulatory Visit (HOSPITAL_COMMUNITY)
Admission: RE | Admit: 2021-04-12 | Discharge: 2021-04-12 | Disposition: A | Discharge status: Home / Self Care | Payer: Medicare HMO | Source: Ambulatory Visit | Attending: Radiology | Admitting: Radiology

## 2021-04-12 DIAGNOSIS — F17210 Nicotine dependence, cigarettes, uncomplicated: Secondary | ICD-10-CM | POA: Diagnosis not present

## 2021-04-12 DIAGNOSIS — I671 Cerebral aneurysm, nonruptured: Secondary | ICD-10-CM | POA: Insufficient documentation

## 2021-04-12 DIAGNOSIS — G43009 Migraine without aura, not intractable, without status migrainosus: Secondary | ICD-10-CM | POA: Insufficient documentation

## 2021-04-12 DIAGNOSIS — Z7902 Long term (current) use of antithrombotics/antiplatelets: Secondary | ICD-10-CM | POA: Insufficient documentation

## 2021-04-12 DIAGNOSIS — Z7982 Long term (current) use of aspirin: Secondary | ICD-10-CM | POA: Diagnosis not present

## 2021-04-15 HISTORY — PX: IR RADIOLOGIST EVAL & MGMT: IMG5224

## 2021-04-16 LAB — PLATELET INHIBITION P2Y12

## 2021-04-17 ENCOUNTER — Ambulatory Visit: Payer: Medicare HMO | Admitting: Neurology

## 2021-04-17 ENCOUNTER — Other Ambulatory Visit: Payer: Self-pay

## 2021-04-17 ENCOUNTER — Encounter: Payer: Self-pay | Admitting: Neurology

## 2021-04-17 VITALS — BP 97/63 | HR 109 | Ht 62.0 in | Wt 115.4 lb

## 2021-04-17 DIAGNOSIS — G40009 Localization-related (focal) (partial) idiopathic epilepsy and epileptic syndromes with seizures of localized onset, not intractable, without status epilepticus: Secondary | ICD-10-CM | POA: Diagnosis not present

## 2021-04-17 DIAGNOSIS — G250 Essential tremor: Secondary | ICD-10-CM

## 2021-04-17 MED ORDER — DIVALPROEX SODIUM ER 500 MG PO TB24: 500.0000 mg | ORAL_TABLET | Freq: Every evening | ORAL | 3 refills | Status: DC
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
## 2021-05-09 DIAGNOSIS — Z6820 Body mass index (BMI) 20.0-20.9, adult: Secondary | ICD-10-CM | POA: Diagnosis not present

## 2021-05-09 DIAGNOSIS — N3001 Acute cystitis with hematuria: Secondary | ICD-10-CM | POA: Diagnosis not present

## 2021-06-07 DIAGNOSIS — J028 Acute pharyngitis due to other specified organisms: Secondary | ICD-10-CM | POA: Diagnosis not present

## 2021-06-07 DIAGNOSIS — J029 Acute pharyngitis, unspecified: Secondary | ICD-10-CM | POA: Diagnosis not present

## 2021-06-07 DIAGNOSIS — Z6820 Body mass index (BMI) 20.0-20.9, adult: Secondary | ICD-10-CM | POA: Diagnosis not present

## 2021-06-07 DIAGNOSIS — G250 Essential tremor: Secondary | ICD-10-CM | POA: Diagnosis not present

## 2021-06-07 DIAGNOSIS — B9689 Other specified bacterial agents as the cause of diseases classified elsewhere: Secondary | ICD-10-CM | POA: Diagnosis not present

## 2021-06-07 DIAGNOSIS — Z20828 Contact with and (suspected) exposure to other viral communicable diseases: Secondary | ICD-10-CM | POA: Diagnosis not present

## 2021-06-10 ENCOUNTER — Telehealth: Payer: Self-pay | Admitting: Internal Medicine

## 2021-06-10 ENCOUNTER — Telehealth (HOSPITAL_COMMUNITY): Payer: Self-pay

## 2021-06-10 MED ORDER — CLOPIDOGREL BISULFATE 75 MG PO TABS: 37.5000 mg | ORAL_TABLET | Freq: Two times a day (BID) | ORAL | 2 refills | Status: DC

## 2021-06-10 NOTE — Telephone Encounter (Signed)
Pt called for a Plavix refill. I've sent a request to our PA to refill. AW

## 2021-06-10 NOTE — Progress Notes (Signed)
Plavix refill sent electronically to pharmacy on file per pt request. Gildardo Pounds to call pt and notify that their prescription request completed.     Narda Rutherford, AGNP-BC 06/10/2021, 10:08 AM

## 2021-06-11 ENCOUNTER — Telehealth: Payer: Self-pay | Admitting: Neurology

## 2021-06-11 NOTE — Telephone Encounter (Signed)
Pls call PCP back and let them know I had discussed this with the patient in the past and she declined and had cancelled the appointment. Has the patient agreed to it before we schedule again and patient cancel appt again? Thanks

## 2021-06-11 NOTE — Telephone Encounter (Signed)
They saw the pt on Friday and her tremors were getting worse. They would like for her to see a tremor specialist. She would like to find out if Dr. Delice Lesch wanted to do the referral or if they should?

## 2021-06-11 NOTE — Telephone Encounter (Signed)
Left message on PCP voicemail to clarify if patient would like to go to this since she declined before. Await confirmation from PCP Triage Nurse Alyse Low

## 2021-06-11 NOTE — Telephone Encounter (Signed)
Office closed at [facility] 06/11/2021

## 2021-07-17 ENCOUNTER — Other Ambulatory Visit: Payer: Self-pay

## 2021-07-17 DIAGNOSIS — Z9289 Personal history of other medical treatment: Secondary | ICD-10-CM | POA: Insufficient documentation

## 2021-07-17 DIAGNOSIS — M1990 Unspecified osteoarthritis, unspecified site: Secondary | ICD-10-CM | POA: Insufficient documentation

## 2021-07-17 DIAGNOSIS — B977 Papillomavirus as the cause of diseases classified elsewhere: Secondary | ICD-10-CM | POA: Insufficient documentation

## 2021-07-19 ENCOUNTER — Encounter: Payer: Self-pay | Admitting: Cardiology

## 2021-07-19 ENCOUNTER — Other Ambulatory Visit: Payer: Self-pay

## 2021-07-19 ENCOUNTER — Ambulatory Visit: Payer: Medicare HMO | Admitting: Cardiology

## 2021-07-19 VITALS — BP 106/72 | HR 106 | Ht 62.6 in | Wt 122.2 lb

## 2021-07-19 DIAGNOSIS — Z1329 Encounter for screening for other suspected endocrine disorder: Secondary | ICD-10-CM | POA: Diagnosis not present

## 2021-07-19 DIAGNOSIS — I739 Peripheral vascular disease, unspecified: Secondary | ICD-10-CM | POA: Diagnosis not present

## 2021-07-19 DIAGNOSIS — E785 Hyperlipidemia, unspecified: Secondary | ICD-10-CM | POA: Diagnosis not present

## 2021-07-19 DIAGNOSIS — I671 Cerebral aneurysm, nonruptured: Secondary | ICD-10-CM | POA: Diagnosis not present

## 2021-07-19 DIAGNOSIS — G250 Essential tremor: Secondary | ICD-10-CM | POA: Diagnosis not present

## 2021-07-19 DIAGNOSIS — Z6821 Body mass index (BMI) 21.0-21.9, adult: Secondary | ICD-10-CM | POA: Diagnosis not present

## 2021-07-19 DIAGNOSIS — Z1331 Encounter for screening for depression: Secondary | ICD-10-CM | POA: Diagnosis not present

## 2021-07-19 DIAGNOSIS — Z1321 Encounter for screening for nutritional disorder: Secondary | ICD-10-CM

## 2021-07-19 DIAGNOSIS — F17210 Nicotine dependence, cigarettes, uncomplicated: Secondary | ICD-10-CM

## 2021-07-19 DIAGNOSIS — I720 Aneurysm of carotid artery: Secondary | ICD-10-CM | POA: Diagnosis not present

## 2021-07-19 DIAGNOSIS — M810 Age-related osteoporosis without current pathological fracture: Secondary | ICD-10-CM | POA: Diagnosis not present

## 2021-07-19 DIAGNOSIS — G40909 Epilepsy, unspecified, not intractable, without status epilepticus: Secondary | ICD-10-CM | POA: Diagnosis not present

## 2021-07-19 DIAGNOSIS — Z131 Encounter for screening for diabetes mellitus: Secondary | ICD-10-CM

## 2021-07-19 MED ORDER — ROSUVASTATIN CALCIUM 10 MG PO TABS: 10.0000 mg | ORAL_TABLET | Freq: Every day | ORAL | 3 refills | Status: AC

## 2021-07-19 NOTE — Progress Notes (Signed)
?Cardiology Office Note:   ? ?Date:  07/19/2021  ? ?ID:  Lindsay Erickson, DOB 05/11/1960, MRN [id number] ? ?PCP:  Serita Grammes, MD  ?Cardiologist:  Jenean Lindau, MD  ? ?Referring MD: Serita Grammes, MD  ? ? ?ASSESSMENT:   ? ?1. Brain aneurysm   ?2. Cigarette smoker   ?3. Dyslipidemia   ? ?PLAN:   ? ?In order of problems listed above: ? ?Primary prevention stressed with the patient.  Importance of compliance with diet medication stressed and she vocalized understanding.  EKG reveals sinus tachycardia.  She tells me that when she comes to the doctor's office she is a little bit anxious.  I do not want to add any medication such as beta-blocker because of borderline blood pressure. ?Atherosclerotic vascular disease: I discussed this with the patient at length. ?Mixed dyslipidemia: Diet was emphasized.  She is fasting and will have complete blood work today.  I will also add hemoglobin A1c and vitamin D to her blood work per her request. ?Cigarette smoker: I spent 5 minutes with the patient discussing solely about smoking. Smoking cessation was counseled. I suggested to the patient also different medications and pharmacological interventions. Patient is keen to try stopping on its own at this time. He will get back to me if he needs any further assistance in this matter. ?She was advised to walk at least half an hour a day on a regular basis and she promises to do so. ?Patient will be seen in follow-up appointment in 6 months or earlier if the patient has any concerns ? ? ? ?Medication Adjustments/Labs and Tests Ordered: ?Current medicines are reviewed at length with the patient today.  Concerns regarding medicines are outlined above.  ?No orders of the defined types were placed in this encounter. ? ?No orders of the defined types were placed in this encounter. ? ? ? ?No chief complaint on file. ?  ? ?History of Present Illness:   ? ?Lindsay Erickson is a 61 y.o. female.  Patient has past medical history  of cerebral aneurysm post intervention, mixed dyslipidemia and history of smoking.  She denies any problems at this time and takes care of activities of daily living.  Her husband accompanies her for her visit and is very supportive.  No chest pain orthopnea or PND.  At the time of my evaluation, the patient is alert awake oriented and in no distress.  Unfortunately she continues to smoke. ? ?Past Medical History:  ?Diagnosis Date  ? Abnormal involuntary movement 07/26/2013  ? Acid reflux   ? Anemia   ? Anxiety   ? 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 Arthritis   ? knee  ? Benign familial tremor 06/17/2017  ? Brain aneurysm 05/14/2020  ? Cardiac murmur 07/11/2020  ? no current problems  ? Chest discomfort 06/22/2018  ? Chronic tension-type headache, not intractable 05/10/2014  ? Cigarette smoker 01/16/2021  ? Constipation   ? 03/15/21 - improved  ? Depression   ? Diplopia 09/30/2017  ? Dyslipidemia   ? Essential tremor   ? History of blood transfusion   ? 2 units after a surgical procedure  ? History of kidney stones   ? passed stones  ? HPV in female   ? Memory loss   ? mild  ? Migraine without aura and without status migrainosus, not intractable 06/17/2017  ? Mixed dyslipidemia 06/22/2018  ? Partial epilepsy with impairment of consciousness ([place]) 07/26/2013  ? Peripheral vascular disease ([place])   ?  blood clot in leg after being kicked by a horse - age 39  ? Pneumonia   ? PONV (postoperative nausea and vomiting)   ? just 1 time after breast biopsy and headache, no anesthesia problems with other surgeries  ? Seizure disorder ([place])   ? last seizure 1980s- controlled with meds  ? Tremor, essential   ? ? ?Past Surgical History:  ?Procedure Laterality Date  ? APPLICATION OF WOUND VAC Right 05/14/2020  ? Procedure: APPLICATION OF WOUND VAC;  Surgeon: Cherre Robins, MD;  Location[facility];  Service: Vascular;  Laterality: Right;  ? BREAST BIOPSY Left   ? X3  ? CARPAL TUNNEL RELEASE    ? CATARACT EXTRACTION Bilateral   ? FALSE  ANEURYSM REPAIR Right 05/14/2020  ? Procedure: REPAIR FALSE ANEURYSM;  Surgeon: Cherre Robins, MD;  Location: [facility];  Service: Vascular;  Laterality: Right;  ? IR 3D INDEPENDENT WKST  03/18/2021  ? IR ANGIO INTRA EXTRACRAN SEL COM CAROTID INNOMINATE BILAT MOD SED  01/12/2020  ? IR ANGIO INTRA EXTRACRAN SEL INTERNAL CAROTID UNI L MOD SED  05/14/2020  ? IR ANGIO INTRA EXTRACRAN SEL INTERNAL CAROTID UNI R MOD SED  03/18/2021  ? IR ANGIO VERTEBRAL SEL VERTEBRAL BILAT MOD SED  01/12/2020  ? IR ANGIOGRAM FOLLOW UP STUDY  05/14/2020  ? IR ANGIOGRAM FOLLOW UP STUDY  05/14/2020  ? IR RADIOLOGIST EVAL & MGMT  06/06/2020  ? IR RADIOLOGIST EVAL & MGMT  12/03/2020  ? IR RADIOLOGIST EVAL & MGMT  04/15/2021  ? IR TRANSCATH/EMBOLIZ  05/14/2020  ? IR TRANSCATH/EMBOLIZ  03/18/2021  ? IR US GUIDE VASC ACCESS RIGHT  01/12/2020  ? IR US GUIDE VASC ACCESS RIGHT  03/18/2021  ? RADIOLOGY WITH ANESTHESIA N/A 05/14/2020  ? Procedure: IR WITH ANESTHESIA ANEURYSM EMBOLIZATION;  Surgeon: [name], MD;  Location: [facility];  Service: Radiology;  Laterality: N/A;  ? RADIOLOGY WITH ANESTHESIA N/A 03/18/2021  ? Procedure: EMBLOZATION;  Surgeon: [name], MD;  Location: [place];  Service: Radiology;  Laterality: N/A;  ? SALPINGOOPHORECTOMY    ? both ovaries removed by laparoscopic  ? VAGINAL HYSTERECTOMY    ? ? ?Current Medications: ?Current Meds  ?Medication Sig  ? acetaminophen (TYLENOL) 500 MG tablet Take 500 mg by mouth every 8 (eight) hours as needed for headache.  ? acetaminophen (TYLENOL) 650 MG CR tablet Take 650 mg by mouth every 8 (eight) hours as needed for pain.  ? albuterol (VENTOLIN HFA) 108 (90 Base) MCG/ACT inhaler Inhale 1-2 puffs into the lungs as needed for wheezing or shortness of breath.  ? alendronate (FOSAMAX) 70 MG tablet Take 70 mg by mouth once a week.  ? aspirin EC 81 MG tablet Take 81 mg by mouth daily. Swallow whole.  ? clopidogrel (PLAVIX) 75 MG tablet Take 37.5 mg by mouth daily.  ? divalproex  (DEPAKOTE ER) 500 MG 24 hr tablet Take 1 tablet (500 mg total) by mouth every evening.  ? nicotine (NICODERM CQ - DOSED IN MG/24 HOURS) 14 mg/24hr patch Place 14 mg onto the skin daily as needed (nicotine cravings).  ? rosuvastatin (CRESTOR) 10 MG tablet Take 1 tablet (10 mg total) by mouth at bedtime.  ?  ? ?Allergies:   Effexor [venlafaxine], Gabapentin, Primidone, Propranolol, Latex, Codeine, and Nickel  ? ?Social History  ? ?Socioeconomic History  ? Marital status: Married  ?  Spouse name: Not on file  ? Number of children: Not on file  ? Years of education: Not  on file  ? Highest education level: Not on file  ?Occupational History  ? Not on file  ?Tobacco Use  ? Smoking status: Every Day  ?  Packs/day: 0.25  ?  Types: Cigarettes  ? Smokeless tobacco: Never  ? Tobacco comments:  ?  2-4 cigarettes per day.  Patient states "trying to quit"  ?Vaping Use  ? Vaping Use: Never used  ?Substance and Sexual Activity  ? Alcohol use: Not Currently  ?  Comment: rare  ? Drug use: Never  ? Sexual activity: Not on file  ?  Comment: Hysterectomy  ?Other Topics Concern  ? Not on file  ?Social History Narrative  ? One story home mobile home  ? Right handed  ? Drinks caffeine on occasion  ? ?Social Determinants of Health  ? ?Financial Resource Strain: Not on file  ?Food Insecurity: Not on file  ?Transportation Needs: Not on file  ?Physical Activity: Not on file  ?Stress: Not on file  ?Social Connections: Not on file  ?  ? ?Family History: ?The patient's family history includes Atrial fibrillation in her sister; Diabetes in her brother and maternal grandmother; Heart attack in her maternal uncle and maternal uncle; Heart disease in her father and mother; Stroke in her mother; Tremor in her father and mother. ? ?ROS:   ?Please see the history of present illness.    ?All other systems reviewed and are negative. ? ?EKGs/Labs/Other Studies Reviewed:   ? ?The following studies were reviewed today: ?EKG reveals sinus tachycardia and  nonspecific ST-T changes ? ? ?Recent Labs: ?01/16/2021: ALT 10; TSH 1.870 ?03/19/2021: BUN 11; Creatinine, Ser 0.45; Hemoglobin 9.8; Platelets 210; Potassium 3.6; Sodium 136  ?Recent Lipid Panel ?   ?Component Valu

## 2021-07-19 NOTE — Patient Instructions (Signed)
Medication Instructions:  ?Your physician recommends that you continue on your current medications as directed. Please refer to the Current Medication list given to you today. ? ?*If you need a refill on your cardiac medications before your next appointment, please call your pharmacy* ? ? ?Lab Work: ?Your physician recommends that you have labs done in the office today. Your test included  basic metabolic panel, complete blood count, vitamin D, A1C, TSH, liver function and lipids. ? ?If you have labs (blood work) drawn today and your tests are completely normal, you will receive your results only by: ?MyChart Message (if you have MyChart) OR ?A paper copy in the mail ?If you have any lab test that is abnormal or we need to change your treatment, we will call you to review the results. ? ? ?Testing/Procedures: ?None ordered ? ? ?Follow-Up: ?At [facility], you and your health needs are our priority.  As part of our continuing mission to provide you with exceptional heart care, we have created designated Provider Care Teams.  These Care Teams include your primary Cardiologist (physician) and Advanced Practice Providers (APPs -  Physician Assistants and Nurse Practitioners) who all work together to provide you with the care you need, when you need it. ? ?We recommend signing up for the patient portal called "MyChart".  Sign up information is provided on this After Visit Summary.  MyChart is used to connect with patients for Virtual Visits (Telemedicine).  Patients are able to view lab/test results, encounter notes, upcoming appointments, etc.  Non-urgent messages can be sent to your provider as well.   ?To learn more about what you can do with MyChart, go to [email or web address].   ? ?Your next appointment:   ?9 month(s) ? ?The format for your next appointment:   ?In Person ? ?Provider:   ?[name], MD ? ? ?Other Instructions ?NA   ?

## 2021-07-20 LAB — HEPATIC FUNCTION PANEL
ALT: 9 IU/L (ref 0–32)
AST: 19 IU/L (ref 0–40)
Albumin: 4.9 g/dL — ABNORMAL HIGH (ref 3.8–4.8)
Alkaline Phosphatase: 118 IU/L (ref 44–121)
Bilirubin Total: <0.2 mg/dL (ref 0.0–1.2)
Bilirubin, Direct: <0.1 mg/dL (ref 0.00–0.40)
Total Protein: 8.1 g/dL (ref 6.0–8.5)

## 2021-07-20 LAB — LIPID PANEL
Chol/HDL Ratio: 3.2 ratio (ref 0.0–4.4)
Cholesterol, Total: 190 mg/dL (ref 100–199)
HDL: 60 mg/dL (ref >39)
LDL Chol Calc (NIH): 97 mg/dL (ref 0–99)
Triglycerides: 193 mg/dL — ABNORMAL HIGH (ref 0–149)
VLDL Cholesterol Cal: 33 mg/dL (ref 5–40)

## 2021-07-20 LAB — VITAMIN D 25 HYDROXY (VIT D DEFICIENCY, FRACTURES): Vit D, 25-Hydroxy: 10.2 ng/mL — ABNORMAL LOW (ref 30.0–100.0)

## 2021-07-20 LAB — CBC WITH DIFFERENTIAL/PLATELET
Basophils Absolute: 0 10*3/uL (ref 0.0–0.2)
Basos: 1 %
EOS (ABSOLUTE): 0.1 10*3/uL (ref 0.0–0.4)
Eos: 2 %
Hematocrit: 40.6 % (ref 34.0–46.6)
Hemoglobin: 13.6 g/dL (ref 11.1–15.9)
Immature Grans (Abs): 0 10*3/uL (ref 0.0–0.1)
Immature Granulocytes: 0 %
Lymphocytes Absolute: 3 10*3/uL (ref 0.7–3.1)
Lymphs: 51 %
MCH: 31 pg (ref 26.6–33.0)
MCHC: 33.5 g/dL (ref 31.5–35.7)
MCV: 93 fL (ref 79–97)
Monocytes Absolute: 0.5 10*3/uL (ref 0.1–0.9)
Monocytes: 9 %
Neutrophils Absolute: 2.1 10*3/uL (ref 1.4–7.0)
Neutrophils: 37 %
Platelets: 401 10*3/uL (ref 150–450)
RBC: 4.39 x10E6/uL (ref 3.77–5.28)
RDW: 14.4 % (ref 11.7–15.4)
WBC: 5.8 10*3/uL (ref 3.4–10.8)

## 2021-07-20 LAB — BASIC METABOLIC PANEL
BUN/Creatinine Ratio: 27 (ref 12–28)
BUN: 18 mg/dL (ref 8–27)
CO2: 22 mmol/L (ref 20–29)
Calcium: 10.1 mg/dL (ref 8.7–10.3)
Chloride: 101 mmol/L (ref 96–106)
Creatinine, Ser: 0.66 mg/dL (ref 0.57–1.00)
Glucose: 113 mg/dL — ABNORMAL HIGH (ref 70–99)
Potassium: 5 mmol/L (ref 3.5–5.2)
Sodium: 141 mmol/L (ref 134–144)
eGFR: 99 mL/min/{1.73_m2} (ref >59)

## 2021-07-20 LAB — TSH: TSH: 3.67 u[IU]/mL (ref 0.450–4.500)

## 2021-07-20 LAB — HEMOGLOBIN A1C
Est. average glucose Bld gHb Est-mCnc: 111 mg/dL
Hgb A1c MFr Bld: 5.5 % (ref 4.8–5.6)

## 2021-09-10 DIAGNOSIS — Z01411 Encounter for gynecological examination (general) (routine) with abnormal findings: Secondary | ICD-10-CM | POA: Diagnosis not present

## 2021-09-10 DIAGNOSIS — G40909 Epilepsy, unspecified, not intractable, without status epilepticus: Secondary | ICD-10-CM | POA: Diagnosis not present

## 2021-09-10 DIAGNOSIS — E559 Vitamin D deficiency, unspecified: Secondary | ICD-10-CM | POA: Diagnosis not present

## 2021-09-10 DIAGNOSIS — Z79899 Other long term (current) drug therapy: Secondary | ICD-10-CM | POA: Diagnosis not present

## 2021-09-10 DIAGNOSIS — I720 Aneurysm of carotid artery: Secondary | ICD-10-CM | POA: Diagnosis not present

## 2021-09-10 DIAGNOSIS — F430 Acute stress reaction: Secondary | ICD-10-CM | POA: Diagnosis not present

## 2021-09-10 DIAGNOSIS — Z1272 Encounter for screening for malignant neoplasm of vagina: Secondary | ICD-10-CM | POA: Diagnosis not present

## 2021-09-10 DIAGNOSIS — E785 Hyperlipidemia, unspecified: Secondary | ICD-10-CM | POA: Diagnosis not present

## 2021-09-10 DIAGNOSIS — Z1151 Encounter for screening for human papillomavirus (HPV): Secondary | ICD-10-CM | POA: Diagnosis not present

## 2021-09-10 DIAGNOSIS — G250 Essential tremor: Secondary | ICD-10-CM | POA: Diagnosis not present

## 2021-09-10 DIAGNOSIS — Z6822 Body mass index (BMI) 22.0-22.9, adult: Secondary | ICD-10-CM | POA: Diagnosis not present

## 2021-09-10 DIAGNOSIS — Z0000 Encounter for general adult medical examination without abnormal findings: Secondary | ICD-10-CM | POA: Diagnosis not present

## 2021-09-16 ENCOUNTER — Other Ambulatory Visit: Payer: Self-pay | Admitting: Radiology

## 2021-09-16 MED ORDER — CLOPIDOGREL BISULFATE 75 MG PO TABS: 37.5000 mg | ORAL_TABLET | Freq: Two times a day (BID) | ORAL | 3 refills | Status: DC

## 2021-09-16 MED ORDER — CLOPIDOGREL BISULFATE 75 MG PO TABS: 37.5000 mg | ORAL_TABLET | Freq: Every day | ORAL | 3 refills | Status: DC

## 2021-09-18 ENCOUNTER — Other Ambulatory Visit (HOSPITAL_COMMUNITY): Payer: Self-pay | Admitting: Interventional Radiology

## 2021-09-18 ENCOUNTER — Telehealth (HOSPITAL_COMMUNITY): Payer: Self-pay

## 2021-09-18 DIAGNOSIS — I671 Cerebral aneurysm, nonruptured: Secondary | ICD-10-CM

## 2021-09-18 NOTE — Telephone Encounter (Signed)
Pt will stop by [facility] radiology after her CTA at [facility] to get a p2y12. AW ?

## 2021-10-04 ENCOUNTER — Ambulatory Visit (HOSPITAL_COMMUNITY)
Admission: RE | Admit: 2021-10-04 | Discharge: 2021-10-04 | Disposition: A | Discharge status: Home / Self Care | Payer: Medicare HMO | Source: Ambulatory Visit | Attending: Interventional Radiology | Admitting: Interventional Radiology

## 2021-10-04 ENCOUNTER — Other Ambulatory Visit (HOSPITAL_COMMUNITY): Payer: Self-pay | Admitting: Radiology

## 2021-10-04 DIAGNOSIS — I671 Cerebral aneurysm, nonruptured: Secondary | ICD-10-CM | POA: Insufficient documentation

## 2021-10-04 DIAGNOSIS — Z1231 Encounter for screening mammogram for malignant neoplasm of breast: Secondary | ICD-10-CM | POA: Diagnosis not present

## 2021-10-04 MED ORDER — IOHEXOL 350 MG/ML SOLN
75.0000 mL | Freq: Once | INTRAVENOUS | Status: AC | PRN
  Administered 2021-10-04: 75 mL via INTRAVENOUS

## 2021-10-04 MED ORDER — SODIUM CHLORIDE (PF) 0.9 % IJ SOLN
INTRAMUSCULAR | Status: AC
  Filled 2021-10-04: qty 50

## 2021-10-08 ENCOUNTER — Telehealth (HOSPITAL_COMMUNITY): Payer: Self-pay

## 2021-10-08 DIAGNOSIS — Z6822 Body mass index (BMI) 22.0-22.9, adult: Secondary | ICD-10-CM | POA: Diagnosis not present

## 2021-10-08 DIAGNOSIS — N3001 Acute cystitis with hematuria: Secondary | ICD-10-CM | POA: Diagnosis not present

## 2021-10-08 DIAGNOSIS — E785 Hyperlipidemia, unspecified: Secondary | ICD-10-CM | POA: Diagnosis not present

## 2021-10-08 DIAGNOSIS — R300 Dysuria: Secondary | ICD-10-CM | POA: Diagnosis not present

## 2021-10-08 DIAGNOSIS — I700 Atherosclerosis of aorta: Secondary | ICD-10-CM | POA: Diagnosis not present

## 2021-10-08 DIAGNOSIS — E559 Vitamin D deficiency, unspecified: Secondary | ICD-10-CM | POA: Diagnosis not present

## 2021-10-08 NOTE — Telephone Encounter (Signed)
Pt agreed to f/u in 6months with a cta. AW 

## 2021-10-09 LAB — PLATELET INHIBITION P2Y12

## 2021-10-11 ENCOUNTER — Telehealth: Payer: Self-pay | Admitting: Radiology

## 2021-10-11 NOTE — Progress Notes (Signed)
    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  Taking Plavix 37.5 2x/day ASA 81 mg daily  10/04/21 P2y12:  7  Discussed with Dr Estanislado Pandy New plan:  Plavix 37.5 po 2x/week (Mon and Thursday) Continue ASA 81 mg daily  Continue this regimen x 6 months  Will need CTA 6 months from now  [employer] aware of plan and need for scheduling appt for CTA  Pt and husband also aware of new plan They have good understanding

## 2021-10-16 ENCOUNTER — Other Ambulatory Visit: Payer: Self-pay

## 2021-10-16 DIAGNOSIS — I700 Atherosclerosis of aorta: Secondary | ICD-10-CM | POA: Insufficient documentation

## 2021-10-16 DIAGNOSIS — Z6822 Body mass index (BMI) 22.0-22.9, adult: Secondary | ICD-10-CM | POA: Insufficient documentation

## 2021-10-16 DIAGNOSIS — N3001 Acute cystitis with hematuria: Secondary | ICD-10-CM | POA: Insufficient documentation

## 2021-10-16 DIAGNOSIS — I671 Cerebral aneurysm, nonruptured: Secondary | ICD-10-CM | POA: Insufficient documentation

## 2021-10-16 DIAGNOSIS — E785 Hyperlipidemia, unspecified: Secondary | ICD-10-CM | POA: Insufficient documentation

## 2021-10-16 DIAGNOSIS — E559 Vitamin D deficiency, unspecified: Secondary | ICD-10-CM | POA: Insufficient documentation

## 2021-10-21 DIAGNOSIS — N890 Mild vaginal dysplasia: Secondary | ICD-10-CM | POA: Diagnosis not present

## 2021-10-21 DIAGNOSIS — R87622 Low grade squamous intraepithelial lesion on cytologic smear of vagina (LGSIL): Secondary | ICD-10-CM | POA: Diagnosis not present

## 2021-10-21 DIAGNOSIS — Z90710 Acquired absence of both cervix and uterus: Secondary | ICD-10-CM | POA: Diagnosis not present

## 2021-11-04 ENCOUNTER — Ambulatory Visit: Payer: Medicare HMO | Admitting: Cardiology

## 2021-11-12 ENCOUNTER — Ambulatory Visit: Payer: Medicare HMO | Admitting: Cardiology

## 2021-11-12 ENCOUNTER — Encounter: Payer: Self-pay | Admitting: Cardiology

## 2021-11-12 VITALS — BP 86/60 | HR 100 | Ht 62.6 in | Wt 124.2 lb

## 2021-11-12 DIAGNOSIS — I671 Cerebral aneurysm, nonruptured: Secondary | ICD-10-CM | POA: Diagnosis not present

## 2021-11-12 DIAGNOSIS — E782 Mixed hyperlipidemia: Secondary | ICD-10-CM

## 2021-11-12 DIAGNOSIS — F17210 Nicotine dependence, cigarettes, uncomplicated: Secondary | ICD-10-CM | POA: Diagnosis not present

## 2021-11-12 DIAGNOSIS — I700 Atherosclerosis of aorta: Secondary | ICD-10-CM | POA: Diagnosis not present

## 2021-11-12 DIAGNOSIS — E559 Vitamin D deficiency, unspecified: Secondary | ICD-10-CM | POA: Diagnosis not present

## 2021-11-12 NOTE — Progress Notes (Signed)
Cardiology Office Note:    Date:  11/12/2021   ID:  Lindsay Erickson, DOB 01-16-1961, MRN [id number]  PCP:  Serita Grammes, MD  Cardiologist:  Jenean Lindau, MD   Referring MD: Serita Grammes, MD    ASSESSMENT:    1. Aneurysm, ophthalmic artery   2. Aortic arch atherosclerosis ([place])   3. Mixed hyperlipidemia   4. Mixed dyslipidemia   5. Cigarette smoker   6. Vitamin D deficiency    PLAN:    In order of problems listed above:  Atherosclerotic vascular disease: Secondary prevention stressed with the patient.  Importance of compliance with diet and medication stressed and she vocalized understanding. Mixed dyslipidemia: Lipids were reviewed and discussed with the patient at length.  They are fine and she is happy about it. Cigarette smoker: I spent 5 minutes with the patient discussing solely about smoking. Smoking cessation was counseled. I suggested to the patient also different medications and pharmacological interventions. Patient is keen to try stopping on its own at this time. He will get back to me if he needs any further assistance in this matter. History of stroke: Managed by neurology and primary medicine. Patient will be seen in follow-up appointment in 9 months or earlier if the patient has any concerns    Medication Adjustments/Labs and Tests Ordered: Current medicines are reviewed at length with the patient today.  Concerns regarding medicines are outlined above.  No orders of the defined types were placed in this encounter.  No orders of the defined types were placed in this encounter.    No chief complaint on file.    History of Present Illness:    Lindsay Erickson is a 61 y.o. female.  Patient has past medical history of aortic atherosclerosis, atherosclerotic vascular disease, mixed dyslipidemia and cigarette smoking.  She has had a history of brain aneurysm in the past.  She denies any problems with this time and takes care of duties of daily  living.  No chest pain orthopnea or PND.  At the time of my evaluation, the patient is alert awake oriented and in no distress.  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   Aortic arch atherosclerosis ([facility])    Arthritis    knee   Benign familial tremor 06/17/2017   BMI 22.0-22.9, adult    Brain aneurysm 05/14/2020   Cardiac murmur 07/11/2020   no current problems   Chest discomfort 06/22/2018   Chronic tension-type headache, not intractable 05/10/2014   Cigarette smoker 01/16/2021   Constipation    03/15/21 - improved   Depression    Diplopia 09/30/2017   Dyslipidemia    Essential tremor    History of blood transfusion    2 units after a surgical procedure   History of kidney stones    passed stones   HPV in female    Hyperlipidemia    Intracranial aneurysm 05/14/2020   Memory loss    mild   Migraine without aura and without status migrainosus, not intractable 06/17/2017   Mixed dyslipidemia 06/22/2018   Partial epilepsy with impairment of consciousness ([place]) 07/26/2013   Peripheral vascular disease ([place])    blood clot in leg after being kicked by a horse - age 73   Pneumonia    PONV (postoperative nausea  and vomiting)    just 1 time after breast biopsy and headache, no anesthesia problems with other surgeries   Seizure disorder ([place])    last seizure 1980s- controlled with meds   Tremor, essential    Vitamin D deficiency     Past Surgical History:  Procedure Laterality Date   APPLICATION OF WOUND VAC Right 05/14/2020   Procedure: APPLICATION OF WOUND VAC;  Surgeon: Cherre Robins, MD;  Location: [place];  Service: Vascular;  Laterality: Right;   BREAST BIOPSY Left    X3   CARPAL TUNNEL RELEASE     CATARACT EXTRACTION Bilateral    FALSE ANEURYSM REPAIR Right 05/14/2020   Procedure: REPAIR  FALSE ANEURYSM;  Surgeon: Cherre Robins, MD;  Location: [place];  Service: Vascular;  Laterality: Right;   IR 3D INDEPENDENT WKST  03/18/2021   IR ANGIO INTRA EXTRACRAN SEL COM CAROTID INNOMINATE BILAT MOD SED  01/12/2020   IR ANGIO INTRA EXTRACRAN SEL INTERNAL CAROTID UNI L MOD SED  05/14/2020   IR ANGIO INTRA EXTRACRAN SEL INTERNAL CAROTID UNI R MOD SED  03/18/2021   IR ANGIO VERTEBRAL SEL VERTEBRAL BILAT MOD SED  01/12/2020   IR ANGIOGRAM FOLLOW UP STUDY  05/14/2020   IR ANGIOGRAM FOLLOW UP STUDY  05/14/2020   IR RADIOLOGIST EVAL & MGMT  06/06/2020   IR RADIOLOGIST EVAL & MGMT  12/03/2020   IR RADIOLOGIST EVAL & MGMT  04/15/2021   IR TRANSCATH/EMBOLIZ  05/14/2020   IR TRANSCATH/EMBOLIZ  03/18/2021   IR US GUIDE VASC ACCESS RIGHT  01/12/2020   IR US GUIDE VASC ACCESS RIGHT  03/18/2021   RADIOLOGY WITH ANESTHESIA N/A 05/14/2020   Procedure: IR WITH ANESTHESIA ANEURYSM EMBOLIZATION;  Surgeon: [name], MD;  Location: [place];  Service: Radiology;  Laterality: N/A;   RADIOLOGY WITH ANESTHESIA N/A 03/18/2021   Procedure: [employer];  Surgeon: [name], MD;  Location: [place];  Service: Radiology;  Laterality: N/A;   SALPINGOOPHORECTOMY     both ovaries removed by laparoscopic   VAGINAL HYSTERECTOMY      Current Medications: Current Meds  Medication Sig   acetaminophen (TYLENOL) 500 MG tablet Take 500 mg by mouth every 8 (eight) hours as needed for headache.   alendronate (FOSAMAX) 70 MG tablet Take 70 mg by mouth once a week.   aspirin EC 81 MG tablet Take 81 mg by mouth daily. Swallow whole.   divalproex (DEPAKOTE) 500 MG DR tablet Take 500 mg by mouth at bedtime.   LORazepam (ATIVAN) 0.5 MG tablet Take 0.5 mg by mouth every 12 (twelve) hours as needed for anxiety.   rosuvastatin (CRESTOR) 10 MG tablet Take 1 tablet (10 mg total) by mouth at bedtime.   Vitamin D, Ergocalciferol, (DRISDOL) 1.25 MG (50000 UNIT) CAPS capsule Take 50,000 Units by mouth once a week.      Allergies:   Effexor [venlafaxine], Gabapentin, Primidone, Propranolol, Latex, Codeine, and Nickel   Social History   Socioeconomic History   Marital status: Married    Spouse name: Not on file   Number of children: Not on file   Years of education: Not on file   Highest education level: Not on file  Occupational History   Not on file  Tobacco Use   Smoking status: Every Day    Packs/day: 0.25    Types: Cigarettes   Smokeless tobacco: Never   Tobacco comments:    2-4 cigarettes per day.  Patient states "trying to quit"  Vaping Use  Vaping Use: Never used  Substance and Sexual Activity   Alcohol use: Not Currently    Comment: rare   Drug use: Never   Sexual activity: Not on file    Comment: Hysterectomy  Other Topics Concern   Not on file  Social History Narrative   One story home mobile home   Right handed   Drinks caffeine on occasion   Social Determinants of Health   Financial Resource Strain: Not on file  Food Insecurity: Not on file  Transportation Needs: Not on file  Physical Activity: Not on file  Stress: Not on file  Social Connections: Not on file     Family History: The patient's family history includes Atrial fibrillation in her sister; Diabetes in her brother and maternal grandmother; Heart attack in her maternal uncle and maternal uncle; Heart disease in her father and mother; Stroke in her mother; Tremor in her father and mother.  ROS:   Please see the history of present illness.    All other systems reviewed and are negative.  EKGs/Labs/Other Studies Reviewed:    The following studies were reviewed today: I discussed my findings with the patient at length.   Recent Labs: 07/19/2021: ALT 9; BUN 18; Creatinine, Ser 0.66; Hemoglobin 13.6; Platelets 401; Potassium 5.0; Sodium 141; TSH 3.670  Recent Lipid Panel    Component Value Date/Time   CHOL 190 07/19/2021 0929   TRIG 193 (H) 07/19/2021 0929   HDL 60 07/19/2021 0929   CHOLHDL 3.2  07/19/2021 0929   LDLCALC 97 07/19/2021 0929    Physical Exam:    VS:  BP (!) 86/60   Pulse 100   Ht 5' 2.6" (1.59 m)   Wt 124 lb 3.2 oz (56.3 kg)   SpO2 97%   BMI 22.28 kg/m     Wt Readings from Last 3 Encounters:  11/12/21 124 lb 3.2 oz (56.3 kg)  07/19/21 122 lb 3.2 oz (55.4 kg)  04/17/21 115 lb 6.4 oz (52.3 kg)     GEN: Patient is in no acute distress HEENT: Normal NECK: No JVD; No carotid bruits LYMPHATICS: No lymphadenopathy CARDIAC: Hear sounds regular, 2/6 systolic murmur at the apex. RESPIRATORY:  Clear to auscultation without rales, wheezing or rhonchi  ABDOMEN: Soft, non-tender, non-distended MUSCULOSKELETAL:  No edema; No deformity  SKIN: Warm and dry NEUROLOGIC:  Alert and oriented x 3 PSYCHIATRIC:  Normal affect   Signed, Jenean Lindau, MD  11/12/2021 8:17 AM    [place]

## 2021-11-12 NOTE — Patient Instructions (Signed)

## 2021-11-13 DIAGNOSIS — I951 Orthostatic hypotension: Secondary | ICD-10-CM | POA: Diagnosis not present

## 2021-11-13 DIAGNOSIS — R109 Unspecified abdominal pain: Secondary | ICD-10-CM | POA: Diagnosis not present

## 2021-11-13 DIAGNOSIS — I959 Hypotension, unspecified: Secondary | ICD-10-CM | POA: Diagnosis not present

## 2021-11-13 DIAGNOSIS — E860 Dehydration: Secondary | ICD-10-CM | POA: Diagnosis not present

## 2021-11-13 DIAGNOSIS — R319 Hematuria, unspecified: Secondary | ICD-10-CM | POA: Diagnosis not present

## 2021-11-13 DIAGNOSIS — N390 Urinary tract infection, site not specified: Secondary | ICD-10-CM | POA: Diagnosis not present

## 2021-11-13 DIAGNOSIS — R300 Dysuria: Secondary | ICD-10-CM | POA: Diagnosis not present

## 2021-11-25 DIAGNOSIS — Z6822 Body mass index (BMI) 22.0-22.9, adult: Secondary | ICD-10-CM | POA: Diagnosis not present

## 2021-11-25 DIAGNOSIS — N3001 Acute cystitis with hematuria: Secondary | ICD-10-CM | POA: Diagnosis not present

## 2021-11-28 ENCOUNTER — Ambulatory Visit: Payer: Medicare HMO | Admitting: Neurology

## 2021-11-28 ENCOUNTER — Encounter: Payer: Self-pay | Admitting: Neurology

## 2021-11-28 VITALS — BP 100/60 | HR 90 | Ht 62.0 in | Wt 126.0 lb

## 2021-11-28 DIAGNOSIS — G250 Essential tremor: Secondary | ICD-10-CM | POA: Diagnosis not present

## 2021-11-28 DIAGNOSIS — G40009 Localization-related (focal) (partial) idiopathic epilepsy and epileptic syndromes with seizures of localized onset, not intractable, without status epilepticus: Secondary | ICD-10-CM | POA: Diagnosis not present

## 2021-11-28 MED ORDER — DIVALPROEX SODIUM 500 MG PO DR TAB: 500.0000 mg | DELAYED_RELEASE_TABLET | Freq: Every evening | ORAL | 3 refills | Status: DC
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
## 2021-12-09 ENCOUNTER — Ambulatory Visit: Payer: Medicare HMO | Admitting: Neurology

## 2021-12-11 NOTE — Progress Notes (Signed)
none

## 2021-12-18 DIAGNOSIS — Z7901 Long term (current) use of anticoagulants: Secondary | ICD-10-CM | POA: Diagnosis not present

## 2021-12-18 DIAGNOSIS — R000 Tachycardia, unspecified: Secondary | ICD-10-CM | POA: Diagnosis not present

## 2021-12-18 DIAGNOSIS — Z6822 Body mass index (BMI) 22.0-22.9, adult: Secondary | ICD-10-CM | POA: Diagnosis not present

## 2021-12-18 DIAGNOSIS — I700 Atherosclerosis of aorta: Secondary | ICD-10-CM | POA: Diagnosis not present

## 2021-12-18 DIAGNOSIS — G250 Essential tremor: Secondary | ICD-10-CM | POA: Diagnosis not present

## 2021-12-18 DIAGNOSIS — I720 Aneurysm of carotid artery: Secondary | ICD-10-CM | POA: Diagnosis not present

## 2021-12-18 DIAGNOSIS — N3001 Acute cystitis with hematuria: Secondary | ICD-10-CM | POA: Diagnosis not present

## 2021-12-18 DIAGNOSIS — G40909 Epilepsy, unspecified, not intractable, without status epilepticus: Secondary | ICD-10-CM | POA: Diagnosis not present

## 2022-01-03 DIAGNOSIS — I700 Atherosclerosis of aorta: Secondary | ICD-10-CM | POA: Diagnosis not present

## 2022-01-03 DIAGNOSIS — L2389 Allergic contact dermatitis due to other agents: Secondary | ICD-10-CM | POA: Diagnosis not present

## 2022-01-03 DIAGNOSIS — Z6822 Body mass index (BMI) 22.0-22.9, adult: Secondary | ICD-10-CM | POA: Diagnosis not present

## 2022-01-13 NOTE — Progress Notes (Deleted)
Assessment/Plan:   1.  Essential Tremor.  -This is evidenced by the symmetrical nature and longstanding hx of gradually getting worse.  We discussed nature and pathophysiology.  We discussed that this can continue to gradually get worse with time.  We discussed that some medications can worsen this, as can caffeine use.  We discussed medication therapy as well as surgical therapy.  Ultimately, the patient decided to ***.     Subjective:   Lindsay RODELL was seen in consultation in the movement disorder clinic at the request of Dr. Delice Lesch.  records that were made available to me were reviewed, including notes from Dr. Delice Lesch as well as notes from Dr Doy Hutching and Dr. Metta Clines. The evaluation is for possible dbs for tremor.  Tremor started approximately *** ago and involves the ***.  Tremor is most noticeable when ***.   There is *** family hx of tremor.    Affected by caffeine:  {yes no:314532} Affected by alcohol:  {yes no:314532} Affected by stress:  {yes no:314532} Affected by fatigue:  {yes no:314532} Spills soup if on spoon:  {yes no:314532} Spills glass of liquid if full:  {yes no:314532} Affects ADL's (tying shoes, brushing teeth, etc):  {yes no:314532}  Records from Dr. Metta Clines from 2015 indicate that gabapentin made the patient sleepy when she was taking 300 mg 3 times per day.  They switched to primidone 50 mg once per day.  Apparently, she stated that it did not help at 50 mg once a day and she had tried phenobarbital years ago and stopped breathing so she did not want to take the primidone. Records from Dr. Doy Hutching eval in 2019 (which was the initial consultation) indicate patient "cannot take primidone/gabapentin, beta-blockers, venlafaxine.    Current/Previously tried tremor medications: ***Primidone (only tried 50 mg and worried about potential side effects as she was allergic to phenobarbital); gabapentin (sleepy); beta-blocker (unclear which 1 or what the side effect was);  amantadine (100 mg twice per day, sleepy)  Current medications that may exacerbate tremor:  ***  Outside reports reviewed: {Outside review:15817}.  Allergies  Allergen Reactions   Effexor [Venlafaxine] Other (See Comments)    Irritable/hyper   Gabapentin Other (See Comments)    Insomnia   Primidone Other (See Comments)    Ineffective    Propranolol Swelling    Can't take beta blockers. Didn't help tremors.   Latex Itching   Codeine Itching   Nickel Itching and Rash    No outpatient medications have been marked as taking for the 01/15/22 encounter (Appointment) with Yaffa Seckman, Eustace Quail, DO.      Objective:   VITALS:  There were no vitals filed for this visit. Gen:  Appears stated age and in NAD. HEENT:  Normocephalic, atraumatic. The mucous membranes are moist. The superficial temporal arteries are without ropiness or tenderness. Cardiovascular: Regular rate and rhythm. Lungs: Clear to auscultation bilaterally. Neck: There are no carotid bruits noted bilaterally.  NEUROLOGICAL:  Orientation:  The patient is alert and oriented x 3.   Cranial nerves: There is good facial symmetry. Extraocular muscles are intact and visual fields are full to confrontational testing. Speech is fluent and clear. Soft palate rises symmetrically and there is no tongue deviation. Hearing is intact to conversational tone. Tone: Tone is good throughout. Sensation: Sensation is intact to light touch touch throughout (facial, trunk, extremities). Vibration is intact at the bilateral big toe. There is no extinction with double simultaneous stimulation. There is no sensory dermatomal level identified. Coordination:  The patient has no dysdiadichokinesia or dysmetria. Motor: Strength is 5/5 in the bilateral upper and lower extremities.  Shoulder shrug is equal bilaterally.  There is no pronator drift.  There are no fasciculations noted. DTR's: Deep tendon reflexes are 2/4 at the bilateral biceps, triceps,  brachioradialis, patella and achilles.  Plantar responses are downgoing bilaterally. Gait and Station: The patient is able to ambulate without difficulty. The patient is able to heel toe walk without any difficulty. The patient is able to ambulate in a tandem fashion. The patient is able to stand in the Romberg position.   MOVEMENT EXAM: Tremor:  There is *** tremor in the UE, noted most significantly with action.  The patient is *** able to draw Archimedes spirals without significant difficulty.  There is *** tremor at rest.  The patient is *** able to pour water from one glass to another without spilling it.  I have reviewed and interpreted the following labs independently   Chemistry      Component Value Date/Time   NA 141 07/19/2021 0929   K 5.0 07/19/2021 0929   CL 101 07/19/2021 0929   CO2 22 07/19/2021 0929   BUN 18 07/19/2021 0929   CREATININE 0.66 07/19/2021 0929      Component Value Date/Time   CALCIUM 10.1 07/19/2021 0929   ALKPHOS 118 07/19/2021 0929   AST 19 07/19/2021 0929   ALT 9 07/19/2021 0929   BILITOT <0.2 07/19/2021 0929      Lab Results  Component Value Date   WBC 5.8 07/19/2021   HGB 13.6 07/19/2021   HCT 40.6 07/19/2021   MCV 93 07/19/2021   PLT 401 07/19/2021   Lab Results  Component Value Date   TSH 3.670 07/19/2021      Total time spent on today's visit was ***60 minutes, including both face-to-face time and nonface-to-face time.  Time included that spent on review of records (prior notes available to me/labs/imaging if pertinent), discussing treatment and goals, answering patient's questions and coordinating care.  CC:  Serita Grammes, MD

## 2022-01-15 ENCOUNTER — Telehealth: Payer: Self-pay | Admitting: Neurology

## 2022-01-15 ENCOUNTER — Ambulatory Visit: Payer: Medicare HMO | Admitting: Neurology

## 2022-01-15 DIAGNOSIS — M2550 Pain in unspecified joint: Secondary | ICD-10-CM | POA: Diagnosis not present

## 2022-01-15 NOTE — Telephone Encounter (Signed)
Patients husband called and LM with AN. She is having some issues and wont be able to make her appt this AM. He is going to take her to the ER. I spoke with him and she is having an allergic reaction to some medicine that her doctor put her on. She resc her appt.

## 2022-01-16 DIAGNOSIS — M7918 Myalgia, other site: Secondary | ICD-10-CM | POA: Diagnosis not present

## 2022-01-16 DIAGNOSIS — G250 Essential tremor: Secondary | ICD-10-CM | POA: Diagnosis not present

## 2022-01-16 DIAGNOSIS — Z6823 Body mass index (BMI) 23.0-23.9, adult: Secondary | ICD-10-CM | POA: Diagnosis not present

## 2022-02-14 NOTE — Progress Notes (Unsigned)
Assessment/Plan:   1.  Essential Tremor.  -This is evidenced by the symmetrical nature and longstanding hx of gradually getting worse.  We discussed nature and pathophysiology.  We discussed that this can continue to gradually get worse with time.  We discussed that some medications can worsen this, as can caffeine use.  We discussed medication therapy as well as surgical therapy.  Ultimately, the patient decided to ***.     Subjective:   Lindsay Erickson was seen in consultation in the movement disorder clinic at the request of Dr. Delice Lesch.  records that were made available to me were reviewed, including notes from Dr. Delice Lesch as well as notes from Dr Doy Hutching and Dr. Metta Clines. The evaluation is for possible dbs for tremor.  Tremor started approximately *** ago and involves the ***.  Tremor is most noticeable when ***.   There is *** family hx of tremor.    Affected by caffeine:  {yes no:314532} Affected by alcohol:  {yes no:314532} Affected by stress:  {yes no:314532} Affected by fatigue:  {yes no:314532} Spills soup if on spoon:  {yes no:314532} Spills glass of liquid if full:  {yes no:314532} Affects ADL's (tying shoes, brushing teeth, etc):  {yes no:314532}  Records from Dr. Metta Clines from 2015 indicate that gabapentin made the patient sleepy when she was taking 300 mg 3 times per day.  They switched to primidone 50 mg once per day.  Apparently, she stated that it did not help at 50 mg once a day and she had tried phenobarbital years ago and stopped breathing so she did not want to take the primidone. Records from Dr. Doy Hutching eval in 2019 (which was the initial consultation) indicate patient "cannot take primidone/gabapentin, beta-blockers, venlafaxine.    Current/Previously tried tremor medications: ***Primidone (only tried 50 mg and worried about potential side effects as she was allergic to phenobarbital); gabapentin (sleepy); beta-blocker (unclear which 1 or what the side effect was);  amantadine (100 mg twice per day, sleepy)  Current medications that may exacerbate tremor:  ***  Outside reports reviewed: {Outside review:15817}.  Allergies  Allergen Reactions   Effexor [Venlafaxine] Other (See Comments)    Irritable/hyper   Gabapentin Other (See Comments)    Insomnia   Primidone Other (See Comments)    Ineffective    Propranolol Swelling    Can't take beta blockers. Didn't help tremors.   Latex Itching   Codeine Itching   Nickel Itching and Rash    No outpatient medications have been marked as taking for the 02/18/22 encounter (Appointment) with Leanne Sisler, Eustace Quail, DO.      Objective:   VITALS:  There were no vitals filed for this visit. Gen:  Appears stated age and in NAD. HEENT:  Normocephalic, atraumatic. The mucous membranes are moist. The superficial temporal arteries are without ropiness or tenderness. Cardiovascular: Regular rate and rhythm. Lungs: Clear to auscultation bilaterally. Neck: There are no carotid bruits noted bilaterally.  NEUROLOGICAL:  Orientation:  The patient is alert and oriented x 3.   Cranial nerves: There is good facial symmetry. Extraocular muscles are intact and visual fields are full to confrontational testing. Speech is fluent and clear. Soft palate rises symmetrically and there is no tongue deviation. Hearing is intact to conversational tone. Tone: Tone is good throughout. Sensation: Sensation is intact to light touch touch throughout (facial, trunk, extremities). Vibration is intact at the bilateral big toe. There is no extinction with double simultaneous stimulation. There is no sensory dermatomal level identified. Coordination:  The patient has no dysdiadichokinesia or dysmetria. Motor: Strength is 5/5 in the bilateral upper and lower extremities.  Shoulder shrug is equal bilaterally.  There is no pronator drift.  There are no fasciculations noted. DTR's: Deep tendon reflexes are 2/4 at the bilateral biceps, triceps,  brachioradialis, patella and achilles.  Plantar responses are downgoing bilaterally. Gait and Station: The patient is able to ambulate without difficulty. The patient is able to heel toe walk without any difficulty. The patient is able to ambulate in a tandem fashion. The patient is able to stand in the Romberg position.   MOVEMENT EXAM: Tremor:  There is *** tremor in the UE, noted most significantly with action.  The patient is *** able to draw Archimedes spirals without significant difficulty.  There is *** tremor at rest.  The patient is *** able to pour water from one glass to another without spilling it.  I have reviewed and interpreted the following labs independently   Chemistry      Component Value Date/Time   NA 141 07/19/2021 0929   K 5.0 07/19/2021 0929   CL 101 07/19/2021 0929   CO2 22 07/19/2021 0929   BUN 18 07/19/2021 0929   CREATININE 0.66 07/19/2021 0929      Component Value Date/Time   CALCIUM 10.1 07/19/2021 0929   ALKPHOS 118 07/19/2021 0929   AST 19 07/19/2021 0929   ALT 9 07/19/2021 0929   BILITOT <0.2 07/19/2021 0929      Lab Results  Component Value Date   WBC 5.8 07/19/2021   HGB 13.6 07/19/2021   HCT 40.6 07/19/2021   MCV 93 07/19/2021   PLT 401 07/19/2021   Lab Results  Component Value Date   TSH 3.670 07/19/2021      Total time spent on today's visit was ***60 minutes, including both face-to-face time and nonface-to-face time.  Time included that spent on review of records (prior notes available to me/labs/imaging if pertinent), discussing treatment and goals, answering patient's questions and coordinating care.  CC:  Serita Grammes, MD

## 2022-02-18 ENCOUNTER — Ambulatory Visit: Payer: Medicare HMO | Admitting: Neurology

## 2022-02-18 ENCOUNTER — Encounter: Payer: Self-pay | Admitting: Neurology

## 2022-02-18 VITALS — BP 116/72 | HR 68 | Ht 62.0 in | Wt 129.0 lb

## 2022-02-18 DIAGNOSIS — G40009 Localization-related (focal) (partial) idiopathic epilepsy and epileptic syndromes with seizures of localized onset, not intractable, without status epilepticus: Secondary | ICD-10-CM

## 2022-02-18 DIAGNOSIS — G250 Essential tremor: Secondary | ICD-10-CM

## 2022-02-18 NOTE — Patient Instructions (Signed)
Deep Brain Stimulation  Is it the right choice for me?   What is Deep Brain Stimulation (DBS) Surgery?  DBS is a surgical procedure used to treat symptoms of Essential Tremor (ET). It involves the implantation of an electrode into the brain either on one side or both sides. The area of the brain that is typically targeted in ET is the ventral intermediate (VIM) nucleus of the thalamus.   How does DBS work?  The tremor in ET is due to an overactive and oscillating circuit in the brain. With DBS, electricity is sent down the electrodes into this area of the brain to disrupt this abnormal circuit, thereby blocking the tremor. It is important to remember, however, that DBS does not "cure" the disease, but rather is a method of treating the symptoms.   What is involved in the procedure?  The surgical procedure involves the implantation of either one (to treat tremor on one side of the body) or two (to treat tremor on both sides of the body) electrodes. The electrodes are connected to a wire, which is then connected to a generator (either one or two) in the chest. The generator (and wires) are placed under the skin similar to a cardiac pacemaker, thus the device itself is not visible. There will,  however, be a visible lump under the skin where the pacemaker is placed. There will also be small bumps on the scalp where the electrode is secured to the skull.   What symptoms can I expect DBS to treat?  In ET the main symptom is the tremor which the DBS system will treat.   What is the downside to having DBS surgery?  There are 2 major factors that need to be considered prior to having surgery, 1) Risks involved, and 2) the "inconvenience factor".   What are the risks of surgery?  Because the surgery involves introducing a foreign object into the brain, there are inherent risks that are present. First, there is a very small risk, approximately 1-2% chance, of having bleeding into the brain causing  symptoms similar to that of a stroke. Secondly, there is a 5-10% chance of having an infection related to the procedure. If the device gets infected, then the treatment usually requires that the infected hardware be removed temporarily while antibiotics are given. After the infection is resolved, then the hardware needs to be re-implanted. This would generally not leave the patient with permanent problems, but it is easy to understand how disappointed someone might be if they have to go through the surgery again. Symptoms of infection include redness, swelling, or pain around the device. Another risk of brain surgery includes possible seizure. Seizures are abnormal electrical discharges of brain cells. If a seizure occurs, it is almost always at the time of operation. It may require temporarily being treated with seizure medications, but this is typically only short term. Very rarely (much less than 1%) does the infection become more serious and involve the brain.  In some patients with ET we will place electrodes on both sides of the brain to treat tremor on both sides of the body. In these patients there is a 30% chance of causing speech or balance problems when both sides are fully activated. To get rid of this side effect, the intensity of stimulation usually needs to be lowered in one of the electrodes. If this is done, the side effect may well resolve, but some of the tremor may return.  If the electrodes are perfectly  placed and programmed, we can expect an impressive improvement in your tremor. If they are close, but not quite perfectly placed, then there may be some residual tremor  which does not resolve with treatment. This is the main reason we go to such lengths the day of the electrode implantation to be sure we have the electrode optimally placed.   What is the "inconvenience factor"?  Unfortunately, undergoing DBS surgery is a process involving multiple steps. Even prior to surgery, you will  have several visits (which are explained in detail on the subsequent page). The surgery itself takes place in three separate parts. About a week prior to insertion of the electrodes, you will be seen in an ambulatory surgery center to put in markers into the skull, called fiducials. This allows [place] to plan the surgery and to better localize the area in which we will operate. One week later, stage 1 of the procedure will be done in which the electrodes are implanted. Approximately one week later, stage 2 of the surgery will be done in which the generator (battery) is inserted. Weeks later, programming of the device will take place. Programming is fine-tuned over a series of clinic visits, initially 1-2x per week, and then eventually less frequent. Overall, you can expect at least 8-10 visits prior to seeing benefit from DBS surgery.   Is DBS the right choice for you?  As you can now see, there are many things that carefully need to be considered when making this decision. The ultimate decision is yours to make. ET is not a life threatening disease. It can be very disabling and significantly interfere with one's quality of life. It is our job to provide you with all the pros and cons that can help you make the right choice for you. The main question is whether the tremor is so bothersome to you that it is worth it to take a small but significant risk to treat it. We hope this information will guide you in your decision.   Pre-Operative Visits:  1) During this visit your exam will be videotaped with your consent. We will discuss the details of the surgery and answer any more questions you might have.  2) Neuropsychological Testing. This is standard testing in all potential candidates to help determine those patients that may be at high risk for developing worsening cognition from the procedure. This is a long clinic visit (multiple hours) and can be quite exhausting.  3) Pre-Operative MRI. If you are deemed to  be a good surgical candidate based on the above 2 visits, you will need to have MRI imaging done to be used in the surgical planning process. You will need someone to accompany you to this visit that can drive, as sometimes it is necessary to sedate you for the MRI in order to get adequate pictures.  4) You will also have an appointment with the neurosurgeon who works with Dr. [name]. His name is Dr. Vertell Limber.  What to expect regarding the surgery itself:  1. The first step involves placement of the fiducial pins. You are given 4 local injections of anesthetic (numbing medication). Next, 4 pins are screwed into the skull. Following the placement of the pins, you will be transferred down to have a head CT scan. This is used in planning for the surgery. This step is generally done one week prior to scheduled surgery.  2. One week later, you will arrive to the pre-op area OFF of any tremor medications that  may have been prescribed. You will meet nursing and anesthesia staff.  3. You will have the sense of "hurry up and wait" multiple times throughout the day, but it is extremely important to remain as patient as possible. It is during these times that we are busy "behind the scenes" doing the surgical planning  4. You are then brought into the OR suite and placed in a "beach chair"-like position. You will not be under general anesthesia. We need you to be awake during certain parts to allow [place] to do important testing. Because you are awake and having brain surgery, it is not surprising that this is very anxiety provoking and scary! We understand this and will help you through it to the best of our abilities. The actual surgical procedure is not usually painful. It is done with local anesthetic agents. However, the procedure can take many hours, and it is expected that you'll become uncomfortable. We try to minimize any sedating medications, but will give you pain medicines if needed.  5. You will have a bad haircut,  but it will grow back!  6. Following the surgery, you will stay overnight in the hospital for observation.  7. The following day, you will have either an MRI brain or a CT brain to allow [place] to evaluate the placement of the electrodes and also to make sure there were no bleeding complications. Provided there are no complications, you will be discharged home the day after surgery.   It is extremely important to remember that after having DBS surgery, you will need to notify your physicians before you have an MRI.  Most DBS electrodes ARE now MRI compatible but the DBS must be placed in a special mode before the MRI is completed for your safety.  8. About 1 weeks later you will return for an outpatient surgery that lasts 1-2 hours during which the generator(s) will be placed. You will go home on the same day as the surgery. You will find that you are more uncomfortable after this surgery then your first surgery. You will be given medications to help with this. The pain from this surgery usually resolves in 2-3 days.   We also discussed focused ultrasound in detail and discussed that you would need a [facility], as it is not done in [place].  Let us know, or Dr. Delice Lesch know, how you would like to proceed.

## 2022-02-28 ENCOUNTER — Telehealth: Payer: Self-pay | Admitting: Anesthesiology

## 2022-02-28 NOTE — Telephone Encounter (Signed)
Patient left a message to inform Dr Tat that she has decided not to do the DBS.

## 2022-03-20 DIAGNOSIS — G250 Essential tremor: Secondary | ICD-10-CM | POA: Diagnosis not present

## 2022-03-20 DIAGNOSIS — Z6822 Body mass index (BMI) 22.0-22.9, adult: Secondary | ICD-10-CM | POA: Diagnosis not present

## 2022-03-20 DIAGNOSIS — R7302 Impaired glucose tolerance (oral): Secondary | ICD-10-CM | POA: Diagnosis not present

## 2022-03-20 DIAGNOSIS — G40909 Epilepsy, unspecified, not intractable, without status epilepticus: Secondary | ICD-10-CM | POA: Diagnosis not present

## 2022-03-20 DIAGNOSIS — E559 Vitamin D deficiency, unspecified: Secondary | ICD-10-CM | POA: Diagnosis not present

## 2022-03-20 DIAGNOSIS — I720 Aneurysm of carotid artery: Secondary | ICD-10-CM | POA: Diagnosis not present

## 2022-03-20 DIAGNOSIS — Z23 Encounter for immunization: Secondary | ICD-10-CM | POA: Diagnosis not present

## 2022-03-20 DIAGNOSIS — I700 Atherosclerosis of aorta: Secondary | ICD-10-CM | POA: Diagnosis not present

## 2022-04-09 DIAGNOSIS — E119 Type 2 diabetes mellitus without complications: Secondary | ICD-10-CM | POA: Diagnosis not present

## 2022-04-09 DIAGNOSIS — Z723 Lack of physical exercise: Secondary | ICD-10-CM | POA: Diagnosis not present

## 2022-05-07 DIAGNOSIS — Z713 Dietary counseling and surveillance: Secondary | ICD-10-CM | POA: Diagnosis not present

## 2022-05-07 DIAGNOSIS — E119 Type 2 diabetes mellitus without complications: Secondary | ICD-10-CM | POA: Diagnosis not present

## 2022-05-21 DIAGNOSIS — E119 Type 2 diabetes mellitus without complications: Secondary | ICD-10-CM | POA: Diagnosis not present

## 2022-05-21 DIAGNOSIS — Z713 Dietary counseling and surveillance: Secondary | ICD-10-CM | POA: Diagnosis not present

## 2022-06-04 DIAGNOSIS — E119 Type 2 diabetes mellitus without complications: Secondary | ICD-10-CM | POA: Diagnosis not present

## 2022-06-04 DIAGNOSIS — Z713 Dietary counseling and surveillance: Secondary | ICD-10-CM | POA: Diagnosis not present

## 2022-06-20 DIAGNOSIS — L03113 Cellulitis of right upper limb: Secondary | ICD-10-CM | POA: Diagnosis not present

## 2022-06-20 DIAGNOSIS — I700 Atherosclerosis of aorta: Secondary | ICD-10-CM | POA: Diagnosis not present

## 2022-06-20 DIAGNOSIS — M25421 Effusion, right elbow: Secondary | ICD-10-CM | POA: Diagnosis not present

## 2022-06-20 DIAGNOSIS — Z6821 Body mass index (BMI) 21.0-21.9, adult: Secondary | ICD-10-CM | POA: Diagnosis not present

## 2022-06-20 DIAGNOSIS — E1165 Type 2 diabetes mellitus with hyperglycemia: Secondary | ICD-10-CM | POA: Diagnosis not present

## 2022-06-20 DIAGNOSIS — I720 Aneurysm of carotid artery: Secondary | ICD-10-CM | POA: Diagnosis not present

## 2022-06-20 DIAGNOSIS — G40909 Epilepsy, unspecified, not intractable, without status epilepticus: Secondary | ICD-10-CM | POA: Diagnosis not present

## 2022-06-20 DIAGNOSIS — M25521 Pain in right elbow: Secondary | ICD-10-CM | POA: Diagnosis not present

## 2022-07-04 ENCOUNTER — Ambulatory Visit: Payer: Medicare HMO | Admitting: Neurology

## 2022-07-04 ENCOUNTER — Encounter: Payer: Self-pay | Admitting: Neurology

## 2022-07-04 VITALS — BP 104/56 | HR 77 | Ht 63.0 in | Wt 120.7 lb

## 2022-07-04 DIAGNOSIS — G250 Essential tremor: Secondary | ICD-10-CM

## 2022-07-04 DIAGNOSIS — G40009 Localization-related (focal) (partial) idiopathic epilepsy and epileptic syndromes with seizures of localized onset, not intractable, without status epilepticus: Secondary | ICD-10-CM | POA: Diagnosis not present

## 2022-07-04 MED ORDER — DIVALPROEX SODIUM 500 MG PO DR TAB: 500.0000 mg | DELAYED_RELEASE_TABLET | Freq: Every evening | ORAL | 3 refills | Status: DC
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
## 2022-07-07 ENCOUNTER — Telehealth: Payer: Self-pay | Admitting: Neurology

## 2022-07-07 MED ORDER — DIVALPROEX SODIUM ER 500 MG PO TB24: ORAL_TABLET | ORAL | 3 refills | Status: DC

## 2022-07-07 NOTE — Telephone Encounter (Signed)
Rx sent, thanks

## 2022-07-07 NOTE — Telephone Encounter (Signed)
Pt called in and left a message with the access nurse. There has been a mixup with her medication. She has been taking Depakote ER, but the new script is for Depakote DR

## 2022-09-15 DIAGNOSIS — I720 Aneurysm of carotid artery: Secondary | ICD-10-CM | POA: Diagnosis not present

## 2022-09-15 DIAGNOSIS — J45909 Unspecified asthma, uncomplicated: Secondary | ICD-10-CM | POA: Diagnosis not present

## 2022-09-15 DIAGNOSIS — Z1339 Encounter for screening examination for other mental health and behavioral disorders: Secondary | ICD-10-CM | POA: Diagnosis not present

## 2022-09-15 DIAGNOSIS — I700 Atherosclerosis of aorta: Secondary | ICD-10-CM | POA: Diagnosis not present

## 2022-09-15 DIAGNOSIS — Z0000 Encounter for general adult medical examination without abnormal findings: Secondary | ICD-10-CM | POA: Diagnosis not present

## 2022-09-15 DIAGNOSIS — G40909 Epilepsy, unspecified, not intractable, without status epilepticus: Secondary | ICD-10-CM | POA: Diagnosis not present

## 2022-09-15 DIAGNOSIS — Z1331 Encounter for screening for depression: Secondary | ICD-10-CM | POA: Diagnosis not present

## 2022-09-15 DIAGNOSIS — Z79899 Other long term (current) drug therapy: Secondary | ICD-10-CM | POA: Diagnosis not present

## 2022-09-15 DIAGNOSIS — E1165 Type 2 diabetes mellitus with hyperglycemia: Secondary | ICD-10-CM | POA: Diagnosis not present

## 2022-09-19 DIAGNOSIS — E119 Type 2 diabetes mellitus without complications: Secondary | ICD-10-CM | POA: Insufficient documentation

## 2022-09-21 NOTE — Progress Notes (Unsigned)
Cardiology Office Note:    Date:  09/22/2022   ID:  Lindsay Erickson, DOB 1961-03-28, MRN [id number]  PCP:  Buckner Malta, MD   [facility] Providers Cardiologist:  None     Referring MD: Buckner Malta, MD   CC: follow up    History of Present Illness:    Lindsay Erickson is a 62 y.o. female with a hx of PVD, aortic arch atherosclerosis, history of intracranial aneurysm, migraine, seizure disorder, hyperlipidemia, depression, anxiety, tobacco use, stroke.  Echo in March 2022 revealed an EF of 55 to 60%, mild concentric LVH, grade 1 DD, no valvular abnormalities.  Most recently she was evaluated by Dr. Tomie China in July 2023, at that time she was doing well from a cardiac perspective.  She continued to struggle with smoking cessation however wanted to continue to try to stop on her own.  She presents today for follow-up follow-up of her atherosclerosis.  She has been doing well from a cardiac perspective.  She does state that she was diagnosed with diabetes back in the fall, A1c is elevated at 6.7 however she has made significant lifestyle changes and her most recent A1c was well-controlled at 5.8.  She has cut down to approximately 3 cigarettes/day as well. She denies chest pain, palpitations, dyspnea, pnd, orthopnea, n, v, dizziness, syncope, edema, weight gain, or early satiety.    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   Aortic arch atherosclerosis (HCC)    Aortic atherosclerosis (HCC)    Arthritis    knee   Benign familial tremor 06/17/2017   BMI 22.0-22.9, adult    Brain aneurysm 05/14/2020   Cardiac murmur 07/11/2020   no current problems   Chest discomfort 06/22/2018   Chronic tension-type headache, not intractable 05/10/2014   Cigarette smoker 01/16/2021   Constipation     03/15/21 - improved   Depression    Diabetes (HCC)    type 2   Diplopia 09/30/2017   Dyslipidemia    Essential tremor    History of blood transfusion    2 units after a surgical procedure   History of kidney stones    passed stones   HPV in female    Hyperlipidemia    Intracranial aneurysm 05/14/2020   Memory loss    mild   Migraine without aura and without status migrainosus, not intractable 06/17/2017   Mixed dyslipidemia 06/22/2018   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   Peripheral vascular disease (HCC)    blood clot in leg after being kicked by a horse - age 49   Pneumonia    PONV (postoperative nausea and vomiting)    just 1 time after breast biopsy and headache, no anesthesia problems with other surgeries   Seizure disorder (HCC)    last seizure 1980s- controlled with meds   Tremor, essential    Vitamin D deficiency     Past Surgical History:  Procedure Laterality Date   APPLICATION OF WOUND VAC Right 05/14/2020   Procedure: APPLICATION OF WOUND VAC;  Surgeon: Leonie Douglas, MD;  Location: [facility] OR;  Service: Vascular;  Laterality: Right;   BREAST BIOPSY Left    X3   CARPAL TUNNEL RELEASE     CATARACT EXTRACTION Bilateral    FALSE ANEURYSM  REPAIR Right 05/14/2020   Procedure: REPAIR FALSE ANEURYSM;  Surgeon: Leonie Douglas, MD;  Location: [facility] OR;  Service: Vascular;  Laterality: Right;   IR 3D INDEPENDENT WKST  03/18/2021   IR ANGIO INTRA EXTRACRAN SEL COM CAROTID INNOMINATE BILAT MOD SED  01/12/2020   IR ANGIO INTRA EXTRACRAN SEL INTERNAL CAROTID UNI L MOD SED  05/14/2020   IR ANGIO INTRA EXTRACRAN SEL INTERNAL CAROTID UNI R MOD SED  03/18/2021   IR ANGIO VERTEBRAL SEL VERTEBRAL BILAT MOD SED  01/12/2020   IR ANGIOGRAM FOLLOW UP STUDY  05/14/2020   IR ANGIOGRAM FOLLOW UP STUDY  05/14/2020   IR RADIOLOGIST EVAL & MGMT  06/06/2020   IR RADIOLOGIST EVAL & MGMT  12/03/2020   IR RADIOLOGIST EVAL & MGMT  04/15/2021   IR  TRANSCATH/EMBOLIZ  05/14/2020   IR TRANSCATH/EMBOLIZ  03/18/2021   IR US GUIDE VASC ACCESS RIGHT  01/12/2020   IR US GUIDE VASC ACCESS RIGHT  03/18/2021   RADIOLOGY WITH ANESTHESIA N/A 05/14/2020   Procedure: IR WITH ANESTHESIA ANEURYSM EMBOLIZATION;  Surgeon: Julieanne Cotton, MD;  Location: [facility] OR;  Service: Radiology;  Laterality: N/A;   RADIOLOGY WITH ANESTHESIA N/A 03/18/2021   Procedure: Sharman Crate;  Surgeon: Julieanne Cotton, MD;  Location: [facility] OR;  Service: Radiology;  Laterality: N/A;   SALPINGOOPHORECTOMY     both ovaries removed by laparoscopic   VAGINAL HYSTERECTOMY      Current Medications: Current Meds  Medication Sig   acetaminophen (TYLENOL) 500 MG tablet Take 500 mg by mouth every 8 (eight) hours as needed for headache.   albuterol (VENTOLIN HFA) 108 (90 Base) MCG/ACT inhaler Inhale 2 puffs into the lungs every 6 (six) hours as needed for wheezing or shortness of breath.   alendronate (FOSAMAX) 70 MG tablet Take 70 mg by mouth once a week.   aspirin EC 81 MG tablet Take 81 mg by mouth daily. Swallow whole.   calcium carbonate (OSCAL) 1500 (600 Ca) MG TABS tablet Take 1,500 mg by mouth daily with breakfast.   clopidogrel (PLAVIX) 75 MG tablet Take 37.5 mg by mouth 2 (two) times a week. Take 1/2 tablet Monday and Thursday   divalproex (DEPAKOTE ER) 500 MG 24 hr tablet Take 1 tablet every night   LORazepam (ATIVAN) 0.5 MG tablet Take 0.5 mg by mouth daily as needed for anxiety.   Omega-3 Fatty Acids (FISH OIL) 1000 MG CAPS Take 2,000 mg by mouth daily.   rosuvastatin (CRESTOR) 10 MG tablet Take 1 tablet (10 mg total) by mouth at bedtime.   Vitamin D, Ergocalciferol, (DRISDOL) 1.25 MG (50000 UNIT) CAPS capsule Take 50,000 Units by mouth once a week. Wednesday     Allergies:   Effexor [venlafaxine], Gabapentin, Primidone, Propranolol, Latex, Codeine, and Nickel   Social History   Socioeconomic History   Marital status: Married    Spouse name: Not on file   Number of  children: Not on file   Years of education: Not on file   Highest education level: Not on file  Occupational History   Not on file  Tobacco Use   Smoking status: Every Day    Packs/day: .25    Types: Cigarettes   Smokeless tobacco: Never   Tobacco comments:    2-4 cigarettes per day.  Patient states "trying to quit"  Vaping Use   Vaping Use: Never used  Substance and Sexual Activity   Alcohol use: Not Currently    Comment: rare   Drug use: Never  Sexual activity: Not on file    Comment: Hysterectomy  Other Topics Concern   Not on file  Social History Narrative   One story home mobile home   Right handed   Drinks caffeine on occasion   Social Determinants of Health   Financial Resource Strain: Not on file  Food Insecurity: Not on file  Transportation Needs: Not on file  Physical Activity: Not on file  Stress: Not on file  Social Connections: Not on file     Family History: The patient's family history includes Atrial fibrillation in her sister; Diabetes in her brother and maternal grandmother; Heart attack in her maternal uncle and maternal uncle; Heart disease in her father and mother; Stroke in her mother; Tremor in her father, maternal aunt, mother, and sister.  ROS:   Please see the history of present illness.     All other systems reviewed and are negative.  EKGs/Labs/Other Studies Reviewed:    The following studies were reviewed today: Cardiac Studies & Procedures     STRESS TESTS  MYOCARDIAL PERFUSION IMAGING 07/20/2018  Narrative  The left ventricular ejection fraction is hyperdynamic (>65%).  Nuclear stress EF: 78%.  Blood pressure demonstrated a normal response to exercise.  There was no ST segment deviation noted during stress.  The study is normal.  This is a low risk study.  No evidence of ischemia or MI.  Normal LVEF.   ECHOCARDIOGRAM  ECHOCARDIOGRAM COMPLETE 07/19/2020  Narrative ECHOCARDIOGRAM REPORT    Patient Name:    GURTRUDE ESSICK Date of Exam: 07/19/2020 Medical Rec #:  [id number]          Height:       62.6 in Accession #:    [id number]         Weight:       119.6 lb Date of Birth:  12-14-1960           BSA:          1.547 m Patient Age:    59 years           BP:           114/62 mmHg Patient Gender: F                  HR:           79 bpm. Exam Location:    Procedure: 2D Echo  Indications:    Murmur [R01.1 (ICD-10-CM)]  History:        Patient has no prior history of Echocardiogram examinations. Brain aneurysm and PAD, Signs/Symptoms:Chest Pain; Risk Factors:Dyslipidemia.  Sonographer:    Louie Boston Referring Phys: Rito Ehrlich [facility]  IMPRESSIONS   1. Left ventricular ejection fraction, by estimation, is 55 to 60%. The left ventricle has normal function. The left ventricle has no regional wall motion abnormalities. There is mild concentric left ventricular hypertrophy. Left ventricular diastolic parameters are consistent with Grade I diastolic dysfunction (impaired relaxation). 2. Right ventricular systolic function is normal. The right ventricular size is normal. 3. The mitral valve is normal in structure. No evidence of mitral valve regurgitation. No evidence of mitral stenosis. 4. The aortic valve is tricuspid. Aortic valve regurgitation is not visualized. No aortic stenosis is present. 5. The inferior vena cava is normal in size with greater than 50% respiratory variability, suggesting right atrial pressure of 3 mmHg.  FINDINGS Left Ventricle: Left ventricular ejection fraction, by estimation, is 55 to 60%. The left ventricle has normal function. The  left ventricle has no regional wall motion abnormalities. The left ventricular internal cavity size was normal in size. There is mild concentric left ventricular hypertrophy. Left ventricular diastolic parameters are consistent with Grade I diastolic dysfunction (impaired relaxation). Normal left ventricular filling  pressure.  Right Ventricle: The right ventricular size is normal. No increase in right ventricular wall thickness. Right ventricular systolic function is normal.  Left Atrium: Left atrial size was normal in size.  Right Atrium: Right atrial size was normal in size.  Pericardium: There is no evidence of pericardial effusion.  Mitral Valve: The mitral valve is normal in structure. No evidence of mitral valve regurgitation. No evidence of mitral valve stenosis.  Tricuspid Valve: The tricuspid valve is normal in structure. Tricuspid valve regurgitation is mild . No evidence of tricuspid stenosis.  Aortic Valve: The aortic valve is tricuspid. Aortic valve regurgitation is not visualized. No aortic stenosis is present.  Pulmonic Valve: The pulmonic valve was normal in structure. Pulmonic valve regurgitation is not visualized. No evidence of pulmonic stenosis.  Aorta: The aortic root and ascending aorta are structurally normal, with no evidence of dilitation and the aortic arch was not well visualized.  Venous: The inferior vena cava is normal in size with greater than 50% respiratory variability, suggesting right atrial pressure of 3 mmHg.  IAS/Shunts: No atrial level shunt detected by color flow Doppler.   LEFT VENTRICLE PLAX 2D LVIDd:         3.60 cm     Diastology LVIDs:         2.40 cm     LV e' medial:    5.33 cm/s LV PW:         1.20 cm     LV E/e' medial:  13.8 LV IVS:        1.20 cm     LV e' lateral:   4.68 cm/s LVOT diam:     2.00 cm     LV E/e' lateral: 15.8 LV SV:         67 LV SV Index:   43 LVOT Area:     3.14 cm  LV Volumes (MOD) LV vol d, MOD A2C: 18.9 ml LV vol d, MOD A4C: 33.6 ml LV vol s, MOD A2C: 8.1 ml LV vol s, MOD A4C: 16.1 ml LV SV MOD A2C:     10.8 ml LV SV MOD A4C:     33.6 ml LV SV MOD BP:      13.6 ml  RIGHT VENTRICLE             IVC RV S prime:     10.80 cm/s  IVC diam: 1.20 cm TAPSE (M-mode): 1.5 cm  LEFT ATRIUM             Index       RIGHT  ATRIUM           Index LA diam:        2.90 cm 1.87 cm/m  RA Area:     12.80 cm LA Vol (A2C):   26.3 ml 17.00 ml/m RA Volume:   26.80 ml  17.32 ml/m LA Vol (A4C):   20.3 ml 13.12 ml/m LA Biplane Vol: 23.6 ml 15.26 ml/m AORTIC VALVE LVOT Vmax:   99.20 cm/s LVOT Vmean:  70.800 cm/s LVOT VTI:    0.213 m  AORTA Ao Root diam: 2.90 cm Ao Asc diam:  2.80 cm Ao Desc diam: 2.00 cm  MITRAL VALVE  TRICUSPID VALVE MV Area (PHT): 3.61 cm    TR Peak grad:   21.7 mmHg MV Decel Time: 210 msec    TR Vmax:        233.00 cm/s MV E velocity: 73.80 cm/s MV A velocity: 95.20 cm/s  SHUNTS MV E/A ratio:  0.78        Systemic VTI:  0.21 m Systemic Diam: 2.00 cm  Norman Herrlich MD Electronically signed by Norman Herrlich MD Signature Date/Time: 07/19/2020/12:27:44 PM    Final              EKG:  EKG is  ordered today.  The ekg ordered today demonstrates NSR, ST and T wave abnormalities however unchanged since prior EKG tracings.   Recent Labs: No results found for requested labs within last 365 days.  Recent Lipid Panel    Component Value Date/Time   CHOL 190 07/19/2021 0929   TRIG 193 (H) 07/19/2021 0929   HDL 60 07/19/2021 0929   CHOLHDL 3.2 07/19/2021 0929   LDLCALC 97 07/19/2021 0929     Risk Assessment/Calculations:                Physical Exam:    VS:  BP (!) 100/50 (BP Location: Left Arm, Patient Position: Sitting, Cuff Size: Small)   Pulse 98   Ht 5\' 3"  (1.6 m)   Wt 120 lb (54.4 kg)   SpO2 98%   BMI 21.26 kg/m     Wt Readings from Last 3 Encounters:  09/22/22 120 lb (54.4 kg)  07/04/22 120 lb 11.2 oz (54.7 kg)  02/18/22 129 lb (58.5 kg)     GEN:  Well nourished, well developed in no acute distress HEENT: Normal NECK: No JVD; No carotid bruits LYMPHATICS: No lymphadenopathy CARDIAC: RRR, no murmurs, rubs, gallops RESPIRATORY:  Clear to auscultation without rales, wheezing or rhonchi  ABDOMEN: Soft, non-tender, non-distended MUSCULOSKELETAL:  No  edema; No deformity  SKIN: Warm and dry NEUROLOGIC:  Alert and oriented x 3 PSYCHIATRIC:  Normal affect   ASSESSMENT:    1. Aortic arch atherosclerosis (HCC)   2. Mixed hyperlipidemia   3. Cigarette smoker   4. Diastolic dysfunction    PLAN:    In order of problems listed above:  Aortic atherosclerosis- Stable with no anginal symptoms. No indication for ischemic evaluation.  Continue Crestor 10 mg daily, continue aspirin 81 mg daily.  She is on Plavix 37.5 mg daily per neurology. Diastolic dysfunction-noted on echocardiogram in 2022.  She is asymptomatic, denies shortness of breath NYHA class I, euvolemic. Hyperlipidemia-will repeat direct LDL today, continue Crestor 10 mg daily. Tobacco abuse-she has cut down drastically to 3 cigarettes/day, encouraged complete cessation, congratulated her on cutting back.  Disposition-direct LDL, LFTs today.  Return in 1 year, sooner if needed.           Medication Adjustments/Labs and Tests Ordered: Current medicines are reviewed at length with the patient today.  Concerns regarding medicines are outlined above.  Orders Placed This Encounter  Procedures   Hepatic function panel   LDL cholesterol, direct   No orders of the defined types were placed in this encounter.   Patient Instructions  Medication Instructions:  Your physician recommends that you continue on your current medications as directed. Please refer to the Current Medication list given to you today.  *If you need a refill on your cardiac medications before your next appointment, please call your pharmacy*   Lab Work: Your physician recommends that you return for  lab work in: Today for a Liver Function and Direct LDL  If you have labs (blood work) drawn today and your tests are completely normal, you will receive your results only by: MyChart Message (if you have MyChart) OR A paper copy in the mail If you have any lab test that is abnormal or we need to change your  treatment, we will call you to review the results.   Testing/Procedures: NONE   Follow-Up: At [facility], you and your health needs are our priority.  As part of our continuing mission to provide you with exceptional heart care, we have created designated Provider Care Teams.  These Care Teams include your primary Cardiologist (physician) and Advanced Practice Providers (APPs -  Physician Assistants and Nurse Practitioners) who all work together to provide you with the care you need, when you need it.  We recommend signing up for the patient portal called "MyChart".  Sign up information is provided on this After Visit Summary.  MyChart is used to connect with patients for Virtual Visits (Telemedicine).  Patients are able to view lab/test results, encounter notes, upcoming appointments, etc.  Non-urgent messages can be sent to your provider as well.   To learn more about what you can do with MyChart, go to [email or web address].    Your next appointment:   1 year(s)  Provider:   Belva Crome, MD    Other Instructions     Signed, Flossie Dibble, NP  09/22/2022 2:44 PM    [facility]

## 2022-09-22 ENCOUNTER — Encounter: Payer: Self-pay | Admitting: Cardiology

## 2022-09-22 ENCOUNTER — Ambulatory Visit: Payer: Medicare HMO | Attending: Cardiology | Admitting: Cardiology

## 2022-09-22 ENCOUNTER — Other Ambulatory Visit: Payer: Self-pay | Admitting: Cardiology

## 2022-09-22 VITALS — BP 100/50 | HR 98 | Ht 63.0 in | Wt 120.0 lb

## 2022-09-22 DIAGNOSIS — I5189 Other ill-defined heart diseases: Secondary | ICD-10-CM | POA: Diagnosis not present

## 2022-09-22 DIAGNOSIS — E782 Mixed hyperlipidemia: Secondary | ICD-10-CM

## 2022-09-22 DIAGNOSIS — F17210 Nicotine dependence, cigarettes, uncomplicated: Secondary | ICD-10-CM

## 2022-09-22 DIAGNOSIS — I700 Atherosclerosis of aorta: Secondary | ICD-10-CM

## 2022-09-22 NOTE — Addendum Note (Signed)
Addended by: Amrom Ore R on: 09/22/2022 02:56 PM   Modules accepted: Orders  

## 2022-09-22 NOTE — Patient Instructions (Signed)
Medication Instructions:  Your physician recommends that you continue on your current medications as directed. Please refer to the Current Medication list given to you today.  *If you need a refill on your cardiac medications before your next appointment, please call your pharmacy*   Lab Work: Your physician recommends that you return for lab work in: Today for a Liver Function and Direct LDL  If you have labs (blood work) drawn today and your tests are completely normal, you will receive your results only by: MyChart Message (if you have MyChart) OR A paper copy in the mail If you have any lab test that is abnormal or we need to change your treatment, we will call you to review the results.   Testing/Procedures: NONE   Follow-Up: At [facility], you and your health needs are our priority.  As part of our continuing mission to provide you with exceptional heart care, we have created designated Provider Care Teams.  These Care Teams include your primary Cardiologist (physician) and Advanced Practice Providers (APPs -  Physician Assistants and Nurse Practitioners) who all work together to provide you with the care you need, when you need it.  We recommend signing up for the patient portal called "MyChart".  Sign up information is provided on this After Visit Summary.  MyChart is used to connect with patients for Virtual Visits (Telemedicine).  Patients are able to view lab/test results, encounter notes, upcoming appointments, etc.  Non-urgent messages can be sent to your provider as well.   To learn more about what you can do with MyChart, go to [email or web address].    Your next appointment:   1 year(s)  Provider:   Belva Crome, MD    Other Instructions

## 2022-09-23 LAB — HEPATIC FUNCTION PANEL
ALT: 7 IU/L (ref 0–32)
AST: 16 IU/L (ref 0–40)
Albumin: 4.3 g/dL (ref 3.9–4.9)
Alkaline Phosphatase: 91 IU/L (ref 44–121)
Bilirubin Total: <0.2 mg/dL (ref 0.0–1.2)
Bilirubin, Direct: <0.1 mg/dL (ref 0.00–0.40)
Total Protein: 6.6 g/dL (ref 6.0–8.5)

## 2022-09-23 LAB — LDL CHOLESTEROL, DIRECT: LDL Direct: 65 mg/dL (ref 0–99)

## 2022-10-27 DIAGNOSIS — Z01419 Encounter for gynecological examination (general) (routine) without abnormal findings: Secondary | ICD-10-CM | POA: Diagnosis not present

## 2022-10-27 DIAGNOSIS — Z1272 Encounter for screening for malignant neoplasm of vagina: Secondary | ICD-10-CM | POA: Diagnosis not present

## 2022-10-27 DIAGNOSIS — R87612 Low grade squamous intraepithelial lesion on cytologic smear of cervix (LGSIL): Secondary | ICD-10-CM | POA: Diagnosis not present

## 2022-11-20 DIAGNOSIS — Z1231 Encounter for screening mammogram for malignant neoplasm of breast: Secondary | ICD-10-CM | POA: Diagnosis not present

## 2022-12-01 DIAGNOSIS — R87622 Low grade squamous intraepithelial lesion on cytologic smear of vagina (LGSIL): Secondary | ICD-10-CM | POA: Diagnosis not present

## 2022-12-01 DIAGNOSIS — N890 Mild vaginal dysplasia: Secondary | ICD-10-CM | POA: Diagnosis not present

## 2022-12-10 DIAGNOSIS — R928 Other abnormal and inconclusive findings on diagnostic imaging of breast: Secondary | ICD-10-CM | POA: Diagnosis not present

## 2022-12-10 DIAGNOSIS — Z1239 Encounter for other screening for malignant neoplasm of breast: Secondary | ICD-10-CM | POA: Diagnosis not present

## 2022-12-10 DIAGNOSIS — R92322 Mammographic fibroglandular density, left breast: Secondary | ICD-10-CM | POA: Diagnosis not present

## 2022-12-10 DIAGNOSIS — N6325 Unspecified lump in the left breast, overlapping quadrants: Secondary | ICD-10-CM | POA: Diagnosis not present

## 2022-12-10 DIAGNOSIS — R92333 Mammographic heterogeneous density, bilateral breasts: Secondary | ICD-10-CM | POA: Diagnosis not present

## 2022-12-16 DIAGNOSIS — G40909 Epilepsy, unspecified, not intractable, without status epilepticus: Secondary | ICD-10-CM | POA: Diagnosis not present

## 2022-12-16 DIAGNOSIS — Z6820 Body mass index (BMI) 20.0-20.9, adult: Secondary | ICD-10-CM | POA: Diagnosis not present

## 2022-12-16 DIAGNOSIS — I700 Atherosclerosis of aorta: Secondary | ICD-10-CM | POA: Diagnosis not present

## 2022-12-16 DIAGNOSIS — N6320 Unspecified lump in the left breast, unspecified quadrant: Secondary | ICD-10-CM | POA: Diagnosis not present

## 2022-12-16 DIAGNOSIS — N890 Mild vaginal dysplasia: Secondary | ICD-10-CM | POA: Diagnosis not present

## 2022-12-16 DIAGNOSIS — I720 Aneurysm of carotid artery: Secondary | ICD-10-CM | POA: Diagnosis not present

## 2022-12-16 DIAGNOSIS — E1165 Type 2 diabetes mellitus with hyperglycemia: Secondary | ICD-10-CM | POA: Diagnosis not present

## 2022-12-22 DIAGNOSIS — R928 Other abnormal and inconclusive findings on diagnostic imaging of breast: Secondary | ICD-10-CM | POA: Diagnosis not present

## 2022-12-22 DIAGNOSIS — R590 Localized enlarged lymph nodes: Secondary | ICD-10-CM | POA: Diagnosis not present

## 2022-12-22 DIAGNOSIS — N6012 Diffuse cystic mastopathy of left breast: Secondary | ICD-10-CM | POA: Diagnosis not present

## 2022-12-22 DIAGNOSIS — N6325 Unspecified lump in the left breast, overlapping quadrants: Secondary | ICD-10-CM | POA: Diagnosis not present

## 2022-12-22 DIAGNOSIS — R599 Enlarged lymph nodes, unspecified: Secondary | ICD-10-CM | POA: Diagnosis not present

## 2022-12-31 DIAGNOSIS — E1165 Type 2 diabetes mellitus with hyperglycemia: Secondary | ICD-10-CM | POA: Diagnosis not present

## 2023-02-05 DIAGNOSIS — Z6821 Body mass index (BMI) 21.0-21.9, adult: Secondary | ICD-10-CM | POA: Diagnosis not present

## 2023-02-05 DIAGNOSIS — J40 Bronchitis, not specified as acute or chronic: Secondary | ICD-10-CM | POA: Diagnosis not present

## 2023-03-01 DIAGNOSIS — M25551 Pain in right hip: Secondary | ICD-10-CM | POA: Diagnosis not present

## 2023-03-07 DIAGNOSIS — M25572 Pain in left ankle and joints of left foot: Secondary | ICD-10-CM | POA: Diagnosis not present

## 2023-03-07 DIAGNOSIS — M87051 Idiopathic aseptic necrosis of right femur: Secondary | ICD-10-CM | POA: Diagnosis not present

## 2023-03-07 DIAGNOSIS — M87851 Other osteonecrosis, right femur: Secondary | ICD-10-CM | POA: Diagnosis not present

## 2023-03-07 DIAGNOSIS — M25551 Pain in right hip: Secondary | ICD-10-CM | POA: Diagnosis not present

## 2023-03-07 DIAGNOSIS — I959 Hypotension, unspecified: Secondary | ICD-10-CM | POA: Diagnosis not present

## 2023-03-07 DIAGNOSIS — Z7982 Long term (current) use of aspirin: Secondary | ICD-10-CM | POA: Diagnosis not present

## 2023-03-07 DIAGNOSIS — Z79899 Other long term (current) drug therapy: Secondary | ICD-10-CM | POA: Diagnosis not present

## 2023-03-07 DIAGNOSIS — Z7902 Long term (current) use of antithrombotics/antiplatelets: Secondary | ICD-10-CM | POA: Diagnosis not present

## 2023-03-10 DIAGNOSIS — M87051 Idiopathic aseptic necrosis of right femur: Secondary | ICD-10-CM | POA: Diagnosis not present

## 2023-03-17 DIAGNOSIS — Z6821 Body mass index (BMI) 21.0-21.9, adult: Secondary | ICD-10-CM | POA: Diagnosis not present

## 2023-03-17 DIAGNOSIS — Z23 Encounter for immunization: Secondary | ICD-10-CM | POA: Diagnosis not present

## 2023-03-17 DIAGNOSIS — M87051 Idiopathic aseptic necrosis of right femur: Secondary | ICD-10-CM | POA: Diagnosis not present

## 2023-03-17 DIAGNOSIS — Z1331 Encounter for screening for depression: Secondary | ICD-10-CM | POA: Diagnosis not present

## 2023-03-17 DIAGNOSIS — E1165 Type 2 diabetes mellitus with hyperglycemia: Secondary | ICD-10-CM | POA: Diagnosis not present

## 2023-03-31 DIAGNOSIS — E1165 Type 2 diabetes mellitus with hyperglycemia: Secondary | ICD-10-CM | POA: Diagnosis not present

## 2023-04-07 ENCOUNTER — Other Ambulatory Visit (INDEPENDENT_AMBULATORY_CARE_PROVIDER_SITE_OTHER): Payer: Self-pay

## 2023-04-07 ENCOUNTER — Encounter: Payer: Self-pay | Admitting: Orthopaedic Surgery

## 2023-04-07 ENCOUNTER — Ambulatory Visit: Payer: Medicare HMO | Admitting: Orthopaedic Surgery

## 2023-04-07 DIAGNOSIS — M87051 Idiopathic aseptic necrosis of right femur: Secondary | ICD-10-CM

## 2023-04-07 HISTORY — DX: Idiopathic aseptic necrosis of right femur: M87.051

## 2023-04-07 NOTE — Progress Notes (Signed)
Office Visit Note   Patient: Lindsay Erickson           Date of Birth: August 15, 1960           MRN: [id number] Visit Date: 04/07/2023              Requested by: Buckner Malta, MD [address] [place],  [place] [address] PCP: Buckner Malta, MD   Assessment & Plan: Visit Diagnoses:  1. Avascular necrosis of bone of hip, right [facility])     Plan: 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  Based on these findings I recommend a right total hip replacement.  Risk benefits alternatives and prognosis reviewed.  We will get the necessary clearances from Dr. Ivar Drape and Burgardt prior to scheduling surgery.  She will need to stop her Plavix a week in advance.  She is currently taking only half a dose twice a week.  Follow-Up Instructions: No follow-ups on file.   Orders:  Orders Placed This Encounter  Procedures   XR HIP UNILAT W OR W/O PELVIS 2-3 VIEWS RIGHT   No orders of the defined types were placed in this encounter.     Procedures: No procedures performed   Clinical Data: No additional findings.   Subjective: Chief Complaint  Patient presents with   Right Hip - Pain    HPI Lindsay Erickson is a very pleasant 62 year old female here for evaluation of right hip avascular necrosis.  She has experience acute severe right hip pain within the last month.  She is having difficulty with ambulation and standing.  Diclofenac and Robaxin do not help significantly. Review of Systems  Constitutional: Negative.   HENT: Negative.    Eyes: Negative.   Respiratory: Negative.    Cardiovascular: Negative.   Endocrine: Negative.   Musculoskeletal:  Positive for arthralgias.  Neurological: Negative.   Hematological: Negative.   Psychiatric/Behavioral: Negative.    All other systems reviewed and are negative.    Objective: Vital Signs: There were no vitals taken for this visit.  Physical Exam Vitals and nursing note  reviewed.  Constitutional:      Appearance: She is well-developed.  HENT:     Head: Atraumatic.     Nose: Nose normal.  Eyes:     Extraocular Movements: Extraocular movements intact.  Cardiovascular:     Pulses: Normal pulses.  Pulmonary:     Effort: Pulmonary effort is normal.  Abdominal:     Palpations: Abdomen is soft.  Musculoskeletal:     Cervical back: Neck supple.  Skin:    General: Skin is warm.     Capillary Refill: Capillary refill takes less than 2 seconds.  Neurological:     Mental Status: She is alert. Mental status is at baseline.  Psychiatric:        Behavior: Behavior normal.        Thought Content: Thought content normal.        Judgment: Judgment normal.     Ortho Exam Exam of the right hip shows antalgic gait.  Significant pain with movement of the hip joint. Specialty Comments:  No specialty comments available.  Imaging: XR HIP UNILAT W OR W/O PELVIS 2-3 VIEWS RIGHT  Result Date: 04/07/2023 X-rays of the right hip show collapse of the right femoral head consistent with AVN.    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   Cigarette smoker 01/16/2021   Cardiac murmur 07/11/2020   Constipation    PONV (postoperative nausea and vomiting)    Tremor, essential    Seizure disorder (HCC)    Pneumonia    Peripheral vascular disease (HCC)    History of kidney stones    Essential tremor    Dyslipidemia    Depression    Anxiety    Anemia    Acid reflux    Brain aneurysm 05/14/2020   Intracranial aneurysm 05/14/2020    Chest discomfort 06/22/2018   Mixed dyslipidemia 06/22/2018   Diplopia 09/30/2017   Benign familial tremor 06/17/2017   Migraine without aura and without status migrainosus, not intractable 06/17/2017   Chronic tension-type headache, not intractable 05/10/2014   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   Abnormal involuntary movement 07/26/2013   Memory loss 07/26/2013   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   Aortic arch atherosclerosis (HCC)    Aortic atherosclerosis (HCC)    Arthritis    knee   Benign familial tremor 06/17/2017   BMI 22.0-22.9, adult    Brain aneurysm 05/14/2020   Cardiac murmur 07/11/2020   no current problems   Chest discomfort 06/22/2018   Chronic tension-type headache, not intractable 05/10/2014   Cigarette smoker 01/16/2021   Constipation    03/15/21 - improved   Depression    Diabetes (HCC)    type 2   Diplopia 09/30/2017   Dyslipidemia    Essential tremor    History of blood transfusion    2 units after a surgical procedure   History of kidney stones    passed stones   HPV in female    Hyperlipidemia    Intracranial aneurysm 05/14/2020   Memory loss    mild   Migraine without aura and without status migrainosus, not intractable 06/17/2017   Mixed dyslipidemia 06/22/2018   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   Peripheral vascular disease (HCC)    blood clot in leg after being kicked by a horse - age 76   Pneumonia    PONV (postoperative nausea and vomiting)    just 1 time after breast biopsy and headache, no anesthesia problems with other surgeries   Seizure disorder (HCC)    last seizure 1980s- controlled with meds   Tremor, essential    Vitamin D deficiency     Family  History  Problem Relation Age of Onset   Heart disease Mother    Stroke Mother    Tremor Mother    Heart disease Father    Tremor Father    Atrial fibrillation Sister    Tremor Sister    Diabetes Brother    Tremor Maternal Aunt    Heart attack Maternal Uncle    Heart attack Maternal Uncle    Diabetes Maternal Grandmother     Past Surgical History:  Procedure Laterality Date  APPLICATION OF WOUND VAC Right 05/14/2020   Procedure: APPLICATION OF WOUND VAC;  Surgeon: Leonie Douglas, MD;  Location: [facility] OR;  Service: Vascular;  Laterality: Right;   BREAST BIOPSY Left    X3   CARPAL TUNNEL RELEASE     CATARACT EXTRACTION Bilateral    FALSE ANEURYSM REPAIR Right 05/14/2020   Procedure: REPAIR FALSE ANEURYSM;  Surgeon: Leonie Douglas, MD;  Location: [facility] OR;  Service: Vascular;  Laterality: Right;   IR 3D INDEPENDENT WKST  03/18/2021   IR ANGIO INTRA EXTRACRAN SEL COM CAROTID INNOMINATE BILAT MOD SED  01/12/2020   IR ANGIO INTRA EXTRACRAN SEL INTERNAL CAROTID UNI L MOD SED  05/14/2020   IR ANGIO INTRA EXTRACRAN SEL INTERNAL CAROTID UNI R MOD SED  03/18/2021   IR ANGIO VERTEBRAL SEL VERTEBRAL BILAT MOD SED  01/12/2020   IR ANGIOGRAM FOLLOW UP STUDY  05/14/2020   IR ANGIOGRAM FOLLOW UP STUDY  05/14/2020   IR RADIOLOGIST EVAL & MGMT  06/06/2020   IR RADIOLOGIST EVAL & MGMT  12/03/2020   IR RADIOLOGIST EVAL & MGMT  04/15/2021   IR TRANSCATH/EMBOLIZ  05/14/2020   IR TRANSCATH/EMBOLIZ  03/18/2021   IR US GUIDE VASC ACCESS RIGHT  01/12/2020   IR US GUIDE VASC ACCESS RIGHT  03/18/2021   RADIOLOGY WITH ANESTHESIA N/A 05/14/2020   Procedure: IR WITH ANESTHESIA ANEURYSM EMBOLIZATION;  Surgeon: Julieanne Cotton, MD;  Location: [facility] OR;  Service: Radiology;  Laterality: N/A;   RADIOLOGY WITH ANESTHESIA N/A 03/18/2021   Procedure: Sharman Crate;  Surgeon: Julieanne Cotton, MD;  Location: [facility] OR;  Service: Radiology;  Laterality: N/A;   SALPINGOOPHORECTOMY     both ovaries removed by  laparoscopic   VAGINAL HYSTERECTOMY     Social History   Occupational History   Not on file  Tobacco Use   Smoking status: Every Day    Current packs/day: 0.25    Types: Cigarettes   Smokeless tobacco: Never   Tobacco comments:    2-4 cigarettes per day.  Patient states "trying to quit"  Vaping Use   Vaping status: Never Used  Substance and Sexual Activity   Alcohol use: Not Currently    Comment: rare   Drug use: Never   Sexual activity: Not on file    Comment: Hysterectomy

## 2023-04-11 IMAGING — CT CT ANGIO NECK
2 of 8 series · 7 of 33 positions shown · non-contrast
Comparison: Cerebral angiogram 03/18/2021

CLINICAL DATA: Treated aneurysms.

EXAM:
CT ANGIOGRAPHY HEAD AND NECK
TECHNIQUE: Multidetector CT imaging of the head and neck was performed using
the standard protocol during bolus administration of intravenous
contrast. Multiplanar CT image reconstructions and MIPs were
obtained to evaluate the vascular anatomy. Carotid stenosis
measurements (when applicable) are obtained utilizing NASCET
criteria, using the distal internal carotid diameter as the
denominator.

[Series 505: cta head neck thins · axial · 0.52mm/px · z∈[+1362,+1594]mm · 5 of 638 slices shown]
[im 107/638  soft-tissue]
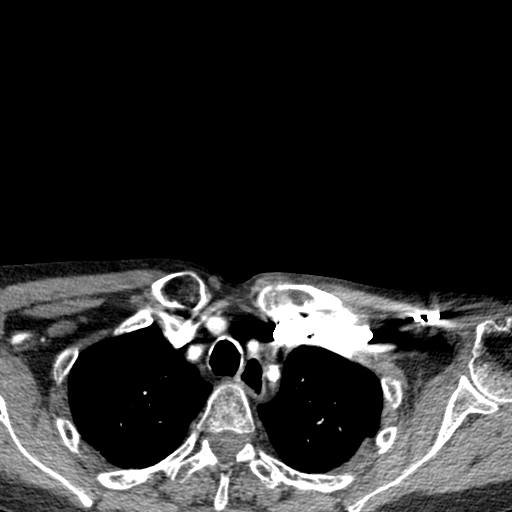
[im 213/638  soft-tissue]
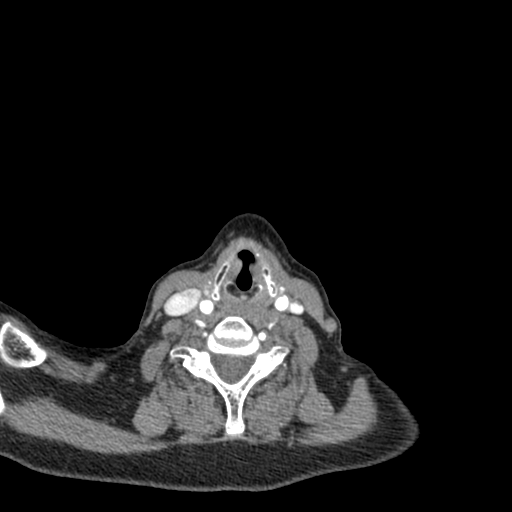
[im 319/638  soft-tissue]
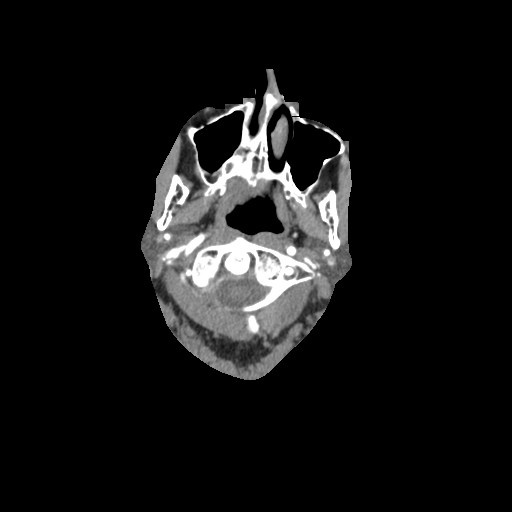
[im 425/638  soft-tissue]
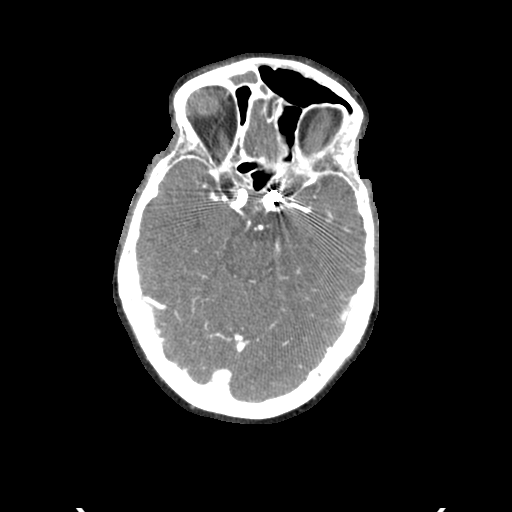
[im 531/638  soft-tissue]
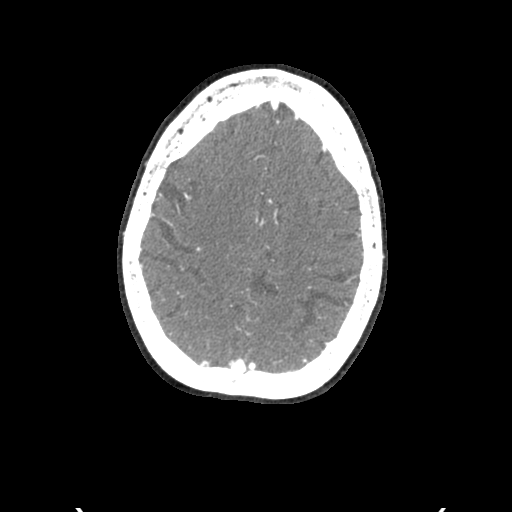

[Series 506: ax thin · axial · 0.39mm/px · z∈[+1419,+1539]mm · 2 of 333 slices shown]
[im 111/333  soft-tissue]
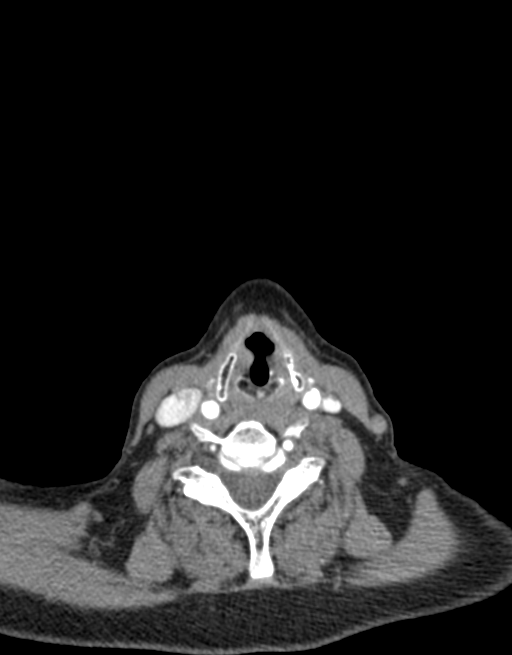
[im 222/333  bone]
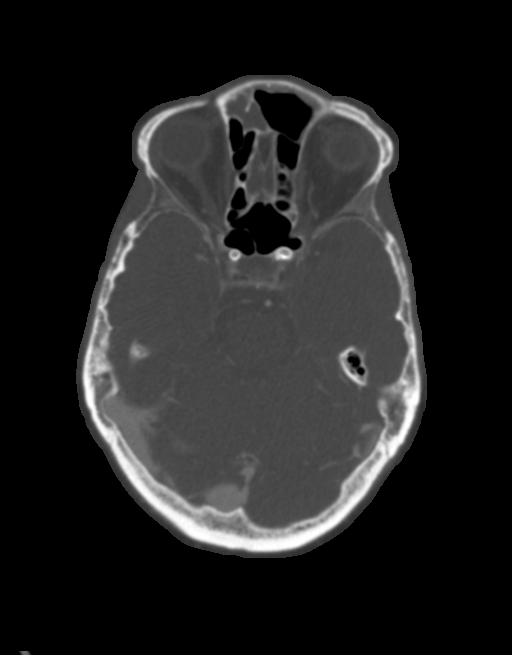

[7 of 33 positions shown; findings below may reference images not displayed]

RADIATION DOSE REDUCTION: This exam was performed according to the
departmental dose-optimization program which includes automated
exposure control, adjustment of the mA and/or kV according to
patient size and/or use of iterative reconstruction technique.

CONTRAST:  75mL OMNIPAQUE IOHEXOL 350 MG/ML SOLN
FINDINGS: CTA NECK FINDINGS

Aortic arch: Atherosclerotic changes are present at the aortic arch.
Calcifications are present at the great vessel origins without focal
stenosis. No aneurysm is present.

Right carotid system: Right common carotid artery is within normal
limits. Atherosclerotic calcifications are present at the
bifurcation without significant stenosis. Mild tortuosity is present
cervical right ICA without significant stenosis.

Left carotid system: Left common carotid artery demonstrates some
mural calcification without stenosis. Additional calcifications are
present bifurcation without significant stenosis. Mild tortuosity is
present in the cervical left ICA without significant stenosis.

Vertebral arteries: The left vertebral artery is the dominant
vessel. Both vertebral arteries originate from the subclavian
arteries without significant stenosis. No significant stenosis is
present in either vertebral artery in the neck.

Skeleton: Vertebral body heights and alignment are normal. Mild
straightening of the normal cervical lordosis is noted. No focal
osseous lesions are present.

Other neck: Soft tissues the neck are otherwise unremarkable.
Salivary glands are within normal limits. Thyroid is normal. No
significant adenopathy is present. No focal mucosal or submucosal
lesions are present.

Upper chest: Lung apices are clear. Thoracic inlet is within normal
limits.

Review of the MIP images confirms the above findings

CTA HEAD FINDINGS

Anterior circulation: The pipeline stent in the right internal
carotid artery is stable in position. No residual filling of the
paraclinoid right ICA aneurysm is present.

The left ICA stent is also stable. Coil pack left periophthalmic
artery aneurysm is stable. No residual recurrent aneurysm is
present.

Flow is present in the ICA terminus bilaterally. The M1 and A1
segments are within normal limits. The left A1 segment is dominant.
The anterior communicating artery is patent. The MCA bifurcations
are intact. ACA and MCA branch vessels are within normal limits.

Posterior circulation: Left vertebral artery is the dominant vessel.
PICA origin is visualized and normal right AICA is dominant. The
basilar artery is within normal limits. Left posterior cerebral
artery originates from the basilar tip. The right posterior cerebral
artery is of fetal type. It remains patent. The PCA branch vessels
are within normal limits bilaterally.

Venous sinuses: The dural sinuses are patent. The straight sinus and
deep cerebral veins are intact. Cortical veins are within normal
limits. No significant vascular malformation is evident. The right
transverse sinus is dominant.

Anatomic variants: Fetal type right posterior cerebral artery.

Review of the MIP images confirms the above findings
IMPRESSION: 1. Right paraclinoid ICA aneurysm successfully occluded. Stable
position of pipeline stent in the right internal carotid artery
without residual filling of the paraclinoid right ICA aneurysm.
2. Stable coil pack left periophthalmic artery aneurysm without
residual or recurrent aneurysm.
3. Flow is present in the ICA terminus bilaterally.
4. No significant proximal stenosis, aneurysm, or branch vessel
occlusion within the Circle of Willis.
5. Atherosclerotic changes at the aortic arch and within the
cervical vasculature without significant stenosis.
6. Aortic Atherosclerosis ([id number]).

## 2023-04-21 ENCOUNTER — Telehealth: Payer: Self-pay | Admitting: Orthopaedic Surgery

## 2023-04-21 NOTE — Telephone Encounter (Signed)
Patient calling regarding surgery.  Left message on voicemail 04-20-23.  Please provider surgery sheet if surgery is in order.  Advise on clearances.

## 2023-05-26 ENCOUNTER — Other Ambulatory Visit: Payer: Self-pay | Admitting: Physician Assistant

## 2023-05-26 MED ORDER — METHOCARBAMOL 750 MG PO TABS: 750.0000 mg | ORAL_TABLET | Freq: Two times a day (BID) | ORAL | 2 refills | Status: DC | PRN

## 2023-05-26 MED ORDER — DOCUSATE SODIUM 100 MG PO CAPS: 100.0000 mg | ORAL_CAPSULE | Freq: Every day | ORAL | 2 refills | Status: AC | PRN

## 2023-05-26 MED ORDER — ONDANSETRON HCL 4 MG PO TABS: 4.0000 mg | ORAL_TABLET | Freq: Three times a day (TID) | ORAL | 0 refills | Status: DC | PRN

## 2023-05-26 MED ORDER — OXYCODONE-ACETAMINOPHEN 5-325 MG PO TABS: 1.0000 | ORAL_TABLET | Freq: Four times a day (QID) | ORAL | 0 refills | Status: DC | PRN

## 2023-05-26 MED ORDER — DOXYCYCLINE HYCLATE 100 MG PO CAPS
100.0000 mg | ORAL_CAPSULE | Freq: Two times a day (BID) | ORAL | 0 refills | Status: DC
Start: 2023-05-26 — End: 2023-07-06

## 2023-05-26 NOTE — Progress Notes (Signed)
Surgical Instructions   Your procedure is scheduled on Wednesday June 03, 2023. Report to [facility] Main Entrance "A" at 5:30 A.M., then check in with the Admitting office. Any questions or running late day of surgery: call [phone]  Questions prior to your surgery date: call [phone], Monday-Friday, 8am-4pm. If you experience any cold or flu symptoms such as cough, fever, chills, shortness of breath, etc. between now and your scheduled surgery, please notify us at the above number.     Remember:  Do not eat after midnight the night before your surgery  You may drink clear liquids until 4:30 the morning of your surgery.   Clear liquids allowed are: Water, Non-Citrus Juices (without pulp), Carbonated Beverages, Clear Tea (no milk, honey, etc.), Black Coffee Only (NO MILK, CREAM OR POWDERED CREAMER of any kind), and Gatorade.  Patient Instructions  The night before surgery:  No food after midnight. ONLY clear liquids after midnight  The day of surgery (if you have diabetes): Drink ONE (1) 12 oz G2 given to you in your pre admission testing appointment by 4:30 the morning of surgery. Drink in one sitting. Do not sip.  This drink was given to you during your hospital  pre-op appointment visit.  Nothing else to drink after completing the  12 oz bottle of G2.         If you have questions, please contact your surgeon's office.     Take these medicines if needed morning of surgery with A SIP OF WATER  albuterol (VENTOLIN HFA) 108 (90 Base) MCG/ACT inhaler. Please bring inhaler with you to the hospital.   famotidine (PEPCID)  LORazepam (ATIVAN)  methocarbamol (ROBAXIN-750)  ondansetron (ZOFRAN)   Follow your surgeon's instructions on when to stop Asprin.  If no instructions were given by your surgeon then you will need to call the office to get those instructions.    PER YOUR SURGEON'S INSTRUCTIONS, STOP YOUR clopidogrel (PLAVIX) ONE WEEK PRIOR TO SURGERY WITH THE LAST  DOSE BEING 05/26/2023.  One week prior to surgery, STOP taking any Aspirin (unless otherwise instructed by your surgeon) Aleve, Naproxen, Ibuprofen, Motrin, Advil, Goody's, BC's, all herbal medications, fish oil, and non-prescription vitamins. This includes your diclofenac (VOLTAREN).                       Do NOT Smoke (Tobacco/Vaping) for 24 hours prior to your procedure.  If you use a CPAP at night, you may bring your mask/headgear for your overnight stay.   You will be asked to remove any contacts, glasses, piercing's, hearing aid's, dentures/partials prior to surgery. Please bring cases for these items if needed.    Patients discharged the day of surgery will not be allowed to drive home, and someone needs to stay with them for 24 hours.  SURGICAL WAITING ROOM VISITATION Patients may have no more than 2 support people in the waiting area - these visitors may rotate.   Pre-op nurse will coordinate an appropriate time for 1 ADULT support person, who may not rotate, to accompany patient in pre-op.  Children under the age of 76 must have an adult with them who is not the patient and must remain in the main waiting area with an adult.  If the patient needs to stay at the hospital during part of their recovery, the visitor guidelines for inpatient rooms apply.  Please refer to the [facility] website for the visitor guidelines for any additional information.   If you  received a COVID test during your pre-op visit  it is requested that you wear a mask when out in public, stay away from anyone that may not be feeling well and notify your surgeon if you develop symptoms. If you have been in contact with anyone that has tested positive in the last 10 days please notify you surgeon.      Pre-operative 5 CHG Bathing Instructions   You can play a key role in reducing the risk of infection after surgery. Your skin needs to be as free of germs as possible. You can reduce the number of germs on your  skin by washing with CHG (chlorhexidine gluconate) soap before surgery. CHG is an antiseptic soap that kills germs and continues to kill germs even after washing.   DO NOT use if you have an allergy to chlorhexidine/CHG or antibacterial soaps. If your skin becomes reddened or irritated, stop using the CHG and notify one of our RNs at [phone].   Please shower with the CHG soap starting 4 days before surgery using the following schedule:     Please keep in mind the following:  DO NOT shave, including legs and underarms, starting the day of your first shower.   You may shave your face at any point before/day of surgery.  Place clean sheets on your bed the day you start using CHG soap. Use a clean washcloth (not used since being washed) for each shower. DO NOT sleep with pets once you start using the CHG.   CHG Shower Instructions:  Wash your face and private area with normal soap. If you choose to wash your hair, wash first with your normal shampoo.  After you use shampoo/soap, rinse your hair and body thoroughly to remove shampoo/soap residue.  Turn the water OFF and apply about 3 tablespoons (45 ml) of CHG soap to a CLEAN washcloth.  Apply CHG soap ONLY FROM YOUR NECK DOWN TO YOUR TOES (washing for 3-5 minutes)  DO NOT use CHG soap on face, private areas, open wounds, or sores.  Pay special attention to the area where your surgery is being performed.  If you are having back surgery, having someone wash your back for you may be helpful. Wait 2 minutes after CHG soap is applied, then you may rinse off the CHG soap.  Pat dry with a clean towel  Put on clean clothes/pajamas   If you choose to wear lotion, please use ONLY the CHG-compatible lotions that are listed below.  Additional instructions for the day of surgery: DO NOT APPLY any lotions, deodorants or perfumes.   Do not bring valuables to the hospital. [facility] is not responsible for any belongings/valuables. Do not wear nail  polish, gel polish, artificial nails, or any other type of covering on natural nails (fingers and toes) Do not wear jewelry or makeup Put on clean/comfortable clothes.  Please brush your teeth.  Ask your nurse before applying any prescription medications to the skin.     CHG Compatible Lotions   Aveeno Moisturizing lotion  Cetaphil Moisturizing Cream  Cetaphil Moisturizing Lotion  Clairol Herbal Essence Moisturizing Lotion, Dry Skin  Clairol Herbal Essence Moisturizing Lotion, Extra Dry Skin  Clairol Herbal Essence Moisturizing Lotion, Normal Skin  Curel Age Defying Therapeutic Moisturizing Lotion with Alpha Hydroxy  Curel Extreme Care Body Lotion  Curel Soothing Hands Moisturizing Hand Lotion  Curel Therapeutic Moisturizing Cream, Fragrance-Free  Curel Therapeutic Moisturizing Lotion, Fragrance-Free  Curel Therapeutic Moisturizing Lotion, Original Formula  Eucerin Daily Replenishing  Lotion  Eucerin Dry Skin Therapy Plus Alpha Hydroxy Crme  Eucerin Dry Skin Therapy Plus Alpha Hydroxy Lotion  Eucerin Original Crme  Eucerin Original Lotion  Eucerin Plus Crme Eucerin Plus Lotion  Eucerin TriLipid Replenishing Lotion  Keri Anti-Bacterial Hand Lotion  Keri Deep Conditioning Original Lotion Dry Skin Formula Softly Scented  Keri Deep Conditioning Original Lotion, Fragrance Free Sensitive Skin Formula  Keri Lotion Fast Absorbing Fragrance Free Sensitive Skin Formula  Keri Lotion Fast Absorbing Softly Scented Dry Skin Formula  Keri Original Lotion  Keri Skin Renewal Lotion Keri Silky Smooth Lotion  Keri Silky Smooth Sensitive Skin Lotion  Nivea Body Creamy Conditioning Oil  Nivea Body Extra Enriched Lotion  Nivea Body Original Lotion  Nivea Body Sheer Moisturizing Lotion Nivea Crme  Nivea Skin Firming Lotion  NutraDerm 30 Skin Lotion  NutraDerm Skin Lotion  NutraDerm Therapeutic Skin Cream  NutraDerm Therapeutic Skin Lotion  ProShield Protective Hand Cream  Provon  moisturizing lotion  Please read over the following fact sheets that you were given.

## 2023-05-27 ENCOUNTER — Inpatient Hospital Stay (HOSPITAL_COMMUNITY)
Admission: RE | Admit: 2023-05-27 | Discharge: 2023-05-27 | Disposition: A | Discharge status: Home / Self Care | Payer: Medicare HMO | Source: Ambulatory Visit

## 2023-05-29 ENCOUNTER — Other Ambulatory Visit: Payer: Self-pay | Admitting: Physician Assistant

## 2023-05-29 ENCOUNTER — Telehealth: Payer: Self-pay | Admitting: Orthopaedic Surgery

## 2023-05-29 ENCOUNTER — Encounter (HOSPITAL_COMMUNITY): Payer: Self-pay

## 2023-05-29 ENCOUNTER — Other Ambulatory Visit: Payer: Self-pay

## 2023-05-29 ENCOUNTER — Encounter (HOSPITAL_COMMUNITY)
Admission: RE | Admit: 2023-05-29 | Discharge: 2023-05-29 | Disposition: A | Discharge status: Home / Self Care | Payer: Medicare HMO | Source: Ambulatory Visit | Attending: Orthopaedic Surgery | Admitting: Orthopaedic Surgery

## 2023-05-29 VITALS — BP 97/59 | HR 100 | Temp 98.0°F | Resp 18

## 2023-05-29 DIAGNOSIS — Z01812 Encounter for preprocedural laboratory examination: Secondary | ICD-10-CM | POA: Insufficient documentation

## 2023-05-29 DIAGNOSIS — I739 Peripheral vascular disease, unspecified: Secondary | ICD-10-CM | POA: Insufficient documentation

## 2023-05-29 DIAGNOSIS — Z79899 Other long term (current) drug therapy: Secondary | ICD-10-CM | POA: Insufficient documentation

## 2023-05-29 DIAGNOSIS — Z7982 Long term (current) use of aspirin: Secondary | ICD-10-CM | POA: Diagnosis not present

## 2023-05-29 DIAGNOSIS — Z7902 Long term (current) use of antithrombotics/antiplatelets: Secondary | ICD-10-CM | POA: Diagnosis not present

## 2023-05-29 DIAGNOSIS — E785 Hyperlipidemia, unspecified: Secondary | ICD-10-CM | POA: Insufficient documentation

## 2023-05-29 DIAGNOSIS — M87051 Idiopathic aseptic necrosis of right femur: Secondary | ICD-10-CM | POA: Diagnosis not present

## 2023-05-29 DIAGNOSIS — Z01818 Encounter for other preprocedural examination: Secondary | ICD-10-CM

## 2023-05-29 LAB — BASIC METABOLIC PANEL
Anion gap: 11 (ref 5–15)
BUN: 28 mg/dL — ABNORMAL HIGH (ref 8–23)
CO2: 26 mmol/L (ref 22–32)
Calcium: 10.1 mg/dL (ref 8.9–10.3)
Chloride: 105 mmol/L (ref 98–111)
Creatinine, Ser: 0.89 mg/dL (ref 0.44–1.00)
GFR, Estimated: >60 mL/min (ref >60)
Glucose, Bld: 91 mg/dL (ref 70–99)
Potassium: 4 mmol/L (ref 3.5–5.1)
Sodium: 142 mmol/L (ref 135–145)

## 2023-05-29 LAB — SURGICAL PCR SCREEN
MRSA, PCR: POSITIVE — AB
Staphylococcus aureus: POSITIVE — AB

## 2023-05-29 LAB — HEMOGLOBIN A1C
Hgb A1c MFr Bld: 5.9 % — ABNORMAL HIGH (ref 4.8–5.6)
Mean Plasma Glucose: 122.63 mg/dL

## 2023-05-29 LAB — CBC
HCT: 38 % (ref 36.0–46.0)
Hemoglobin: 12.4 g/dL (ref 12.0–15.0)
MCH: 31.3 pg (ref 26.0–34.0)
MCHC: 32.6 g/dL (ref 30.0–36.0)
MCV: 96 fL (ref 80.0–100.0)
Platelets: 282 10*3/uL (ref 150–400)
RBC: 3.96 MIL/uL (ref 3.87–5.11)
RDW: 14.2 % (ref 11.5–15.5)
WBC: 8 10*3/uL (ref 4.0–10.5)
nRBC: 0 % (ref 0.0–0.2)

## 2023-05-29 LAB — GLUCOSE, CAPILLARY: Glucose-Capillary: 122 mg/dL — ABNORMAL HIGH (ref 70–99)

## 2023-05-29 NOTE — Progress Notes (Addendum)
PCP - Buckner Malta, MD  Cardiologist - Garwin Brothers, MD   PPM/ICD - denies Device Orders - n/a Rep Notified - n/a  Chest x-ray - denies EKG - 09-22-22 Stress Test - 3-17 ECHO - 07-19-20 Cardiac Cath - denies  Sleep Study - denies CPAP - n/a  Fasting Blood Sugar - per patient between 92-96. Blood sugar at PAT appointment 122 Checks Blood Sugar daily    Blood Thinner Instructions:clopidogrel (PLAVIX) Last dose per patient 05-25-23 Aspirin Instructions:aspirin EC Last dose per patient 05-25-23  ERAS Protcol - clear liquids until 4:30 PRE-SURGERY  G2-   COVID TEST-  n/a   Anesthesia review: yes Hx, DM, PVD, heart murmur, brain aneursym  Patient denies shortness of breath, fever, cough and chest pain at PAT appointment   All instructions explained to the patient, with a verbal understanding of the material. Patient agrees to go over the instructions while at home for a better understanding. Patient also instructed to self quarantine after being tested for COVID-19. The opportunity to ask questions was provided.

## 2023-05-29 NOTE — Progress Notes (Signed)
Message left for MD scheduler regarding PCR results

## 2023-05-29 NOTE — Telephone Encounter (Signed)
I added vanc to surgery orders and doxy post-op.  She will need to be moved as we cannot have her as the first case when needing vancomyin

## 2023-05-29 NOTE — Telephone Encounter (Signed)
Patient is scheduled for right total hip 06-03-23 with Dr Roda Shutters.    Victorino Dike in preadmission testing calling to notify patient is +MRSA +MSSA

## 2023-06-01 NOTE — Progress Notes (Signed)
Anesthesia Chart Review:  63 y.o. female with a hx of PVD, aortic arch atherosclerosis, intracranial aneurysms s/p embolization, migraine, seizure disorder, hyperlipidemia, depression, anxiety.  Echo 07/2020 revealed an EF of 55 to 60%, mild concentric LVH, grade 1 DD, no valvular abnormalities.  Nuclear stress 07/2018 was low risk.  Last seen in cardiology follow-up by Wallis Bamberg, NP on 09/22/2022.  Per note, "Stable with no anginal symptoms. No indication for ischemic evaluation.  Continue Crestor 10 mg daily, continue aspirin 81 mg daily.  She is on Plavix 37.5 mg daily per neurology."  1 year follow-up recommended.  Follows with neurology for history of essential tremor, migraines, and seizures.  Per notes, no convulsions since the 1980s.  She is maintained on low-dose Depakote ER 500 mg nightly.  Follows with interventional radiologist Dr. Corliss Skains for history of bilateral ICA/paraophthalmic aneurysms s/p embolization.  Dr. Corliss Skains cleared patient to hold Plavix for 7 days prior to surgery.  Recommended continue 81 mg aspirin.  Copy of clearance scanned into media tab  Patient reports last dose Plavix 05/25/2023.  Preop labs reviewed, unremarkable.  EKG 09/22/2022: NSR.  Rate 98.  Possible LAE.  Chronic inferior and anterolateral ST and T wave changes.  CTA head/neck 10/04/2021: IMPRESSION: 1. Right paraclinoid ICA aneurysm successfully occluded. Stable position of pipeline stent in the right internal carotid artery without residual filling of the paraclinoid right ICA aneurysm. 2. Stable coil pack left periophthalmic artery aneurysm without residual or recurrent aneurysm. 3. Flow is present in the ICA terminus bilaterally. 4. No significant proximal stenosis, aneurysm, or branch vessel occlusion within the Circle of Willis. 5. Atherosclerotic changes at the aortic arch and within the cervical vasculature without significant stenosis. 6. Aortic Atherosclerosis (ICD10-I70.0).  TTE  07/19/2020:  1. Left ventricular ejection fraction, by estimation, is 55 to 60%. The  left ventricle has normal function. The left ventricle has no regional  wall motion abnormalities. There is mild concentric left ventricular  hypertrophy. Left ventricular diastolic  parameters are consistent with Grade I diastolic dysfunction (impaired  relaxation).   2. Right ventricular systolic function is normal. The right ventricular  size is normal.   3. The mitral valve is normal in structure. No evidence of mitral valve  regurgitation. No evidence of mitral stenosis.   4. The aortic valve is tricuspid. Aortic valve regurgitation is not  visualized. No aortic stenosis is present.   5. The inferior vena cava is normal in size with greater than 50%  respiratory variability, suggesting right atrial pressure of 3 mmHg.   Nuclear stress 07/20/2018: The left ventricular ejection fraction is hyperdynamic (>65%). Nuclear stress EF: 78%. Blood pressure demonstrated a normal response to exercise. There was no ST segment deviation noted during stress. The study is normal. This is a low risk study. No evidence of ischemia or MI. Normal LVEF.    Zannie Cove [facility]/Anesthesiology Phone [phone] 06/01/2023 1:11 PM

## 2023-06-01 NOTE — Anesthesia Preprocedure Evaluation (Signed)
Anesthesia Evaluation  Patient identified by MRN, date of birth, ID band Patient awake    Reviewed: Allergy & Precautions, NPO status , Patient's Chart, lab work & pertinent test results  History of Anesthesia Complications (+) PONV  Airway Mallampati: II  TM Distance: >3 FB Neck ROM: Full    Dental  (+) Missing, Dental Advisory Given   Pulmonary COPD,  COPD inhaler, Current Smoker and Patient abstained from smoking.   breath sounds clear to auscultation       Cardiovascular + Peripheral Vascular Disease   Rhythm:Regular Rate:Normal  '22 ECHO: EF 55 to 60%.  1. The LV has normal function, no regional wall motion abnormalities. There is mild concentric left ventricular hypertrophy.  Grade I diastolic dysfunction (impaired  relaxation).   2. RVF is normal. The right ventricular size is normal.   3. The mitral valve is normal in structure. No evidence of mitral valve regurgitation. No evidence of mitral stenosis.   4. The aortic valve is tricuspid. Aortic valve regurgitation is not visualized. No aortic stenosis is present.     Neuro/Psych  Headaches  Anxiety Depression    Cerebral aneurysm: coiled tremor    GI/Hepatic Neg liver ROS,GERD  Controlled,,  Endo/Other  diabetes (glu 111)    Renal/GU negative Renal ROS     Musculoskeletal  (+) Arthritis ,    Abdominal   Peds  Hematology Plavix: 05/27/2023 last dose Hb 12.4, plt 282   Anesthesia Other Findings   Reproductive/Obstetrics                             Anesthesia Physical Anesthesia Plan  ASA: 3  Anesthesia Plan: Spinal   Post-op Pain Management: Tylenol PO (pre-op)*   Induction:   PONV Risk Score and Plan: 2 and Ondansetron and Treatment may vary due to age or medical condition  Airway Management Planned: Natural Airway and Simple Face Mask  Additional Equipment: None  Intra-op Plan:   Post-operative Plan:   Informed  Consent: I have reviewed the patients History and Physical, chart, labs and discussed the procedure including the risks, benefits and alternatives for the proposed anesthesia with the patient or authorized representative who has indicated his/her understanding and acceptance.     Dental advisory given  Plan Discussed with: CRNA and Surgeon  Anesthesia Plan Comments: (PAT note by Antionette Poles, PA-C:63 y.o. female with a hx of PVD, aortic arch atherosclerosis, intracranial aneurysms s/p embolization, migraine, seizure disorder, hyperlipidemia, depression, anxiety.  Echo 07/2020 revealed an EF of 55 to 60%, mild concentric LVH, grade 1 DD, no valvular abnormalities.  Nuclear stress 07/2018 was low risk.  Last seen in cardiology follow-up by Wallis Bamberg, NP on 09/22/2022.  Per note, "Stable with no anginal symptoms. No indication for ischemic evaluation.  Continue Crestor 10 mg daily, continue aspirin 81 mg daily.  She is on Plavix 37.5 mg daily per neurology."  1 year follow-up recommended.  Follows with neurology for history of essential tremor, migraines, and seizures.  Per notes, no convulsions since the 1980s.  She is maintained on low-dose Depakote ER 500 mg nightly.  Follows with interventional radiologist Dr. Corliss Skains for history of bilateral ICA/paraophthalmic aneurysms s/p embolization.  Dr. Corliss Skains cleared patient to hold Plavix for 7 days prior to surgery.  Recommended continue 81 mg aspirin.  Copy of clearance scanned into media tab  Patient reports last dose Plavix 05/25/2023.  Preop labs reviewed, unremarkable.  EKG 09/22/2022: NSR.  Rate 98.  Possible LAE.  Chronic inferior and anterolateral ST and T wave changes.  CTA head/neck 10/04/2021: IMPRESSION: 1. Right paraclinoid ICA aneurysm successfully occluded. Stable position of pipeline stent in the right internal carotid artery without residual filling of the paraclinoid right ICA aneurysm. 2. Stable coil pack left periophthalmic  artery aneurysm without residual or recurrent aneurysm. 3. Flow is present in the ICA terminus bilaterally. 4. No significant proximal stenosis, aneurysm, or branch vessel occlusion within the Circle of Willis. 5. Atherosclerotic changes at the aortic arch and within the cervical vasculature without significant stenosis. 6. Aortic Atherosclerosis (ICD10-I70.0).  TTE 07/19/2020: 1. Left ventricular ejection fraction, by estimation, is 55 to 60%. The  left ventricle has normal function. The left ventricle has no regional  wall motion abnormalities. There is mild concentric left ventricular  hypertrophy. Left ventricular diastolic  parameters are consistent with Grade I diastolic dysfunction (impaired  relaxation).  2. Right ventricular systolic function is normal. The right ventricular  size is normal.  3. The mitral valve is normal in structure. No evidence of mitral valve  regurgitation. No evidence of mitral stenosis.  4. The aortic valve is tricuspid. Aortic valve regurgitation is not  visualized. No aortic stenosis is present.  5. The inferior vena cava is normal in size with greater than 50%  respiratory variability, suggesting right atrial pressure of 3 mmHg.   Nuclear stress 07/20/2018:  The left ventricular ejection fraction is hyperdynamic (>65%).  Nuclear stress EF: 78%.  Blood pressure demonstrated a normal response to exercise.  There was no ST segment deviation noted during stress.  The study is normal.  This is a low risk study.  No evidence of ischemia or MI.  Normal LVEF.   )        Anesthesia Quick Evaluation

## 2023-06-02 ENCOUNTER — Other Ambulatory Visit: Payer: Self-pay | Admitting: Physician Assistant

## 2023-06-02 ENCOUNTER — Telehealth: Payer: Self-pay | Admitting: *Deleted

## 2023-06-02 ENCOUNTER — Telehealth: Payer: Self-pay | Admitting: Orthopaedic Surgery

## 2023-06-02 DIAGNOSIS — M1611 Unilateral primary osteoarthritis, right hip: Secondary | ICD-10-CM

## 2023-06-02 HISTORY — DX: Unilateral primary osteoarthritis, right hip: M16.11

## 2023-06-02 MED ORDER — TRANEXAMIC ACID 1000 MG/10ML IV SOLN
2000.0000 mg | INTRAVENOUS | Status: AC
  Filled 2023-06-02 (×2): qty 20

## 2023-06-02 NOTE — Telephone Encounter (Signed)
Spoke with patient and notified her to take an oxycodone tablet if needed.

## 2023-06-02 NOTE — Telephone Encounter (Signed)
I sent in her post-op pain meds last week.  She can take one of those if needed

## 2023-06-02 NOTE — Telephone Encounter (Signed)
Patient's right total knee is scheduled tomorrow 06-03-23.  She is asking what she can take today because she is in a lot of pain and it's kept her in bed the last few days. She says the methocarbamol is not working at all.  Please call patient [phone]

## 2023-06-02 NOTE — Telephone Encounter (Signed)
Ortho bundle pre-op call completed.

## 2023-06-02 NOTE — Care Plan (Signed)
[facility] RNCM call to patient to discuss her upcoming Right total hip arthroplasty with Dr. Roda Shutters on 06/03/23 at [facility]. She is agreeable to case management. She lives with her spouse and her sister. She is planning on returning home with their assistance. She will need a RW prior to discharge. Anticipate HHPT will be needed after short hospital stay. Referral made to [facility] after choice provided. Reviewed post op care instructions. Will continue to follow for needs.

## 2023-06-03 ENCOUNTER — Encounter (HOSPITAL_COMMUNITY)
Admission: RE | Disposition: A | Discharge status: Home Health | Payer: Self-pay | Source: Ambulatory Visit | Attending: Orthopaedic Surgery

## 2023-06-03 ENCOUNTER — Other Ambulatory Visit: Payer: Self-pay

## 2023-06-03 ENCOUNTER — Observation Stay (HOSPITAL_COMMUNITY): Payer: Medicare HMO

## 2023-06-03 ENCOUNTER — Ambulatory Visit (HOSPITAL_COMMUNITY): Payer: Self-pay | Admitting: Physician Assistant

## 2023-06-03 ENCOUNTER — Inpatient Hospital Stay (HOSPITAL_COMMUNITY)
Admission: RE | Admit: 2023-06-03 | Discharge: 2023-06-06 | DRG: 470 | Disposition: A | Discharge status: Home Health | Payer: Medicare HMO | Source: Ambulatory Visit | Attending: Orthopaedic Surgery | Admitting: Orthopaedic Surgery

## 2023-06-03 ENCOUNTER — Encounter (HOSPITAL_COMMUNITY): Payer: Self-pay | Admitting: Orthopaedic Surgery

## 2023-06-03 ENCOUNTER — Ambulatory Visit (HOSPITAL_COMMUNITY): Payer: Self-pay | Admitting: Anesthesiology

## 2023-06-03 ENCOUNTER — Ambulatory Visit (HOSPITAL_COMMUNITY): Payer: Medicare HMO

## 2023-06-03 DIAGNOSIS — Z91040 Latex allergy status: Secondary | ICD-10-CM | POA: Diagnosis not present

## 2023-06-03 DIAGNOSIS — Z888 Allergy status to other drugs, medicaments and biological substances status: Secondary | ICD-10-CM

## 2023-06-03 DIAGNOSIS — M1611 Unilateral primary osteoarthritis, right hip: Secondary | ICD-10-CM | POA: Diagnosis present

## 2023-06-03 DIAGNOSIS — E782 Mixed hyperlipidemia: Secondary | ICD-10-CM | POA: Diagnosis present

## 2023-06-03 DIAGNOSIS — Z90710 Acquired absence of both cervix and uterus: Secondary | ICD-10-CM | POA: Diagnosis not present

## 2023-06-03 DIAGNOSIS — Z8249 Family history of ischemic heart disease and other diseases of the circulatory system: Secondary | ICD-10-CM | POA: Diagnosis not present

## 2023-06-03 DIAGNOSIS — G40109 Localization-related (focal) (partial) symptomatic epilepsy and epileptic syndromes with simple partial seizures, not intractable, without status epilepticus: Secondary | ICD-10-CM | POA: Diagnosis present

## 2023-06-03 DIAGNOSIS — Z833 Family history of diabetes mellitus: Secondary | ICD-10-CM | POA: Diagnosis not present

## 2023-06-03 DIAGNOSIS — Z87442 Personal history of urinary calculi: Secondary | ICD-10-CM

## 2023-06-03 DIAGNOSIS — I959 Hypotension, unspecified: Secondary | ICD-10-CM | POA: Diagnosis not present

## 2023-06-03 DIAGNOSIS — Z885 Allergy status to narcotic agent status: Secondary | ICD-10-CM | POA: Diagnosis not present

## 2023-06-03 DIAGNOSIS — Z7982 Long term (current) use of aspirin: Secondary | ICD-10-CM

## 2023-06-03 DIAGNOSIS — F418 Other specified anxiety disorders: Secondary | ICD-10-CM | POA: Diagnosis not present

## 2023-06-03 DIAGNOSIS — R339 Retention of urine, unspecified: Secondary | ICD-10-CM | POA: Diagnosis not present

## 2023-06-03 DIAGNOSIS — G250 Essential tremor: Secondary | ICD-10-CM | POA: Diagnosis present

## 2023-06-03 DIAGNOSIS — Z823 Family history of stroke: Secondary | ICD-10-CM

## 2023-06-03 DIAGNOSIS — F419 Anxiety disorder, unspecified: Secondary | ICD-10-CM | POA: Diagnosis present

## 2023-06-03 DIAGNOSIS — E119 Type 2 diabetes mellitus without complications: Secondary | ICD-10-CM | POA: Diagnosis not present

## 2023-06-03 DIAGNOSIS — M87051 Idiopathic aseptic necrosis of right femur: Secondary | ICD-10-CM

## 2023-06-03 DIAGNOSIS — Z9109 Other allergy status, other than to drugs and biological substances: Secondary | ICD-10-CM | POA: Diagnosis not present

## 2023-06-03 DIAGNOSIS — I739 Peripheral vascular disease, unspecified: Secondary | ICD-10-CM | POA: Diagnosis not present

## 2023-06-03 DIAGNOSIS — J449 Chronic obstructive pulmonary disease, unspecified: Secondary | ICD-10-CM | POA: Diagnosis not present

## 2023-06-03 DIAGNOSIS — Z96641 Presence of right artificial hip joint: Secondary | ICD-10-CM

## 2023-06-03 DIAGNOSIS — Z7983 Long term (current) use of bisphosphonates: Secondary | ICD-10-CM

## 2023-06-03 DIAGNOSIS — M25451 Effusion, right hip: Secondary | ICD-10-CM | POA: Diagnosis present

## 2023-06-03 DIAGNOSIS — Z471 Aftercare following joint replacement surgery: Secondary | ICD-10-CM | POA: Diagnosis not present

## 2023-06-03 DIAGNOSIS — F17210 Nicotine dependence, cigarettes, uncomplicated: Secondary | ICD-10-CM | POA: Diagnosis present

## 2023-06-03 DIAGNOSIS — J4489 Other specified chronic obstructive pulmonary disease: Secondary | ICD-10-CM | POA: Diagnosis present

## 2023-06-03 DIAGNOSIS — Z79899 Other long term (current) drug therapy: Secondary | ICD-10-CM

## 2023-06-03 DIAGNOSIS — E1151 Type 2 diabetes mellitus with diabetic peripheral angiopathy without gangrene: Secondary | ICD-10-CM | POA: Diagnosis present

## 2023-06-03 DIAGNOSIS — K219 Gastro-esophageal reflux disease without esophagitis: Secondary | ICD-10-CM | POA: Diagnosis present

## 2023-06-03 DIAGNOSIS — Z7902 Long term (current) use of antithrombotics/antiplatelets: Secondary | ICD-10-CM

## 2023-06-03 HISTORY — PX: TOTAL HIP ARTHROPLASTY: SHX124

## 2023-06-03 HISTORY — DX: Presence of right artificial hip joint: Z96.641

## 2023-06-03 LAB — GLUCOSE, CAPILLARY
Glucose-Capillary: 111 mg/dL — ABNORMAL HIGH (ref 70–99)
Glucose-Capillary: 141 mg/dL — ABNORMAL HIGH (ref 70–99)

## 2023-06-03 SURGERY — ARTHROPLASTY, HIP, TOTAL, ANTERIOR APPROACH
Anesthesia: Spinal | Site: Hip | Laterality: Right

## 2023-06-03 MED ORDER — ASPIRIN 81 MG PO TBEC
81.0000 mg | DELAYED_RELEASE_TABLET | Freq: Every day | ORAL | Status: DC
  Administered 2023-06-04 – 2023-06-05 (×2): 81 mg via ORAL
  Filled 2023-06-03 (×2): qty 1

## 2023-06-03 MED ORDER — OXYCODONE HCL ER 10 MG PO T12A
10.0000 mg | EXTENDED_RELEASE_TABLET | Freq: Two times a day (BID) | ORAL | Status: DC
  Administered 2023-06-03 – 2023-06-04 (×4): 10 mg via ORAL
  Filled 2023-06-03 (×4): qty 1

## 2023-06-03 MED ORDER — PHENYLEPHRINE HCL (PRESSORS) 10 MG/ML IV SOLN
INTRAVENOUS | Status: AC
  Filled 2023-06-03: qty 1

## 2023-06-03 MED ORDER — HYDROMORPHONE HCL 1 MG/ML IJ SOLN
0.2500 mg | INTRAMUSCULAR | Status: DC | PRN
  Administered 2023-06-03 (×4): 0.5 mg via INTRAVENOUS

## 2023-06-03 MED ORDER — PROPOFOL 10 MG/ML IV BOLUS
INTRAVENOUS | Status: AC
  Filled 2023-06-03: qty 20

## 2023-06-03 MED ORDER — DEXAMETHASONE SODIUM PHOSPHATE 10 MG/ML IJ SOLN
INTRAMUSCULAR | Status: DC | PRN
  Administered 2023-06-03: 6 mg via INTRAVENOUS

## 2023-06-03 MED ORDER — VANCOMYCIN HCL IN DEXTROSE 1-5 GM/200ML-% IV SOLN
1000.0000 mg | Freq: Once | INTRAVENOUS | Status: AC
  Administered 2023-06-03: 1000 mg via INTRAVENOUS
  Filled 2023-06-03 (×2): qty 200

## 2023-06-03 MED ORDER — CHLORHEXIDINE GLUCONATE 4 % EX SOLN: 1.0000 | CUTANEOUS | 1 refills | Status: DC

## 2023-06-03 MED ORDER — PHENYLEPHRINE 80 MCG/ML (10ML) SYRINGE FOR IV PUSH (FOR BLOOD PRESSURE SUPPORT)
PREFILLED_SYRINGE | INTRAVENOUS | Status: DC | PRN
  Administered 2023-06-03 (×2): 160 ug via INTRAVENOUS
  Administered 2023-06-03: 80 ug via INTRAVENOUS
  Administered 2023-06-03: 160 ug via INTRAVENOUS
  Administered 2023-06-03: 80 ug via INTRAVENOUS

## 2023-06-03 MED ORDER — PHENOL 1.4 % MT LIQD: 1.0000 | OROMUCOSAL | Status: DC | PRN

## 2023-06-03 MED ORDER — DOCUSATE SODIUM 100 MG PO CAPS
100.0000 mg | ORAL_CAPSULE | Freq: Two times a day (BID) | ORAL | Status: DC
  Administered 2023-06-03 – 2023-06-06 (×7): 100 mg via ORAL
  Filled 2023-06-03 (×7): qty 1

## 2023-06-03 MED ORDER — PANTOPRAZOLE SODIUM 40 MG PO TBEC
40.0000 mg | DELAYED_RELEASE_TABLET | Freq: Every day | ORAL | Status: DC
  Administered 2023-06-03 – 2023-06-06 (×4): 40 mg via ORAL
  Filled 2023-06-03 (×4): qty 1

## 2023-06-03 MED ORDER — MEPERIDINE HCL 25 MG/ML IJ SOLN: 6.2500 mg | INTRAMUSCULAR | Status: DC | PRN

## 2023-06-03 MED ORDER — HYDROMORPHONE HCL 1 MG/ML IJ SOLN
INTRAMUSCULAR | Status: AC
  Filled 2023-06-03: qty 1

## 2023-06-03 MED ORDER — ORAL CARE MOUTH RINSE: 15.0000 mL | Freq: Once | OROMUCOSAL | Status: AC

## 2023-06-03 MED ORDER — POLYETHYLENE GLYCOL 3350 17 G PO PACK
17.0000 g | PACK | Freq: Every day | ORAL | Status: DC
  Administered 2023-06-03 – 2023-06-06 (×4): 17 g via ORAL
  Filled 2023-06-03 (×4): qty 1

## 2023-06-03 MED ORDER — TRANEXAMIC ACID-NACL 1000-0.7 MG/100ML-% IV SOLN
1000.0000 mg | Freq: Once | INTRAVENOUS | Status: AC
  Administered 2023-06-03: 1000 mg via INTRAVENOUS
  Filled 2023-06-03: qty 100

## 2023-06-03 MED ORDER — DIVALPROEX SODIUM ER 500 MG PO TB24
500.0000 mg | ORAL_TABLET | Freq: Every day | ORAL | Status: DC
Start: 2023-06-03 — End: 2023-06-06
  Administered 2023-06-03 – 2023-06-05 (×3): 500 mg via ORAL
  Filled 2023-06-03 (×3): qty 1

## 2023-06-03 MED ORDER — METOCLOPRAMIDE HCL 5 MG/ML IJ SOLN: 5.0000 mg | Freq: Three times a day (TID) | INTRAMUSCULAR | Status: DC | PRN

## 2023-06-03 MED ORDER — ACETAMINOPHEN 325 MG PO TABS
325.0000 mg | ORAL_TABLET | Freq: Four times a day (QID) | ORAL | Status: DC | PRN
  Administered 2023-06-06: 650 mg via ORAL
  Filled 2023-06-03: qty 2

## 2023-06-03 MED ORDER — VANCOMYCIN HCL 1000 MG IV SOLR
INTRAVENOUS | Status: AC
  Filled 2023-06-03: qty 20

## 2023-06-03 MED ORDER — TRANEXAMIC ACID 1000 MG/10ML IV SOLN
INTRAVENOUS | Status: DC | PRN
  Administered 2023-06-03: 2000 mg via TOPICAL

## 2023-06-03 MED ORDER — PROPOFOL 500 MG/50ML IV EMUL
INTRAVENOUS | Status: DC | PRN
  Administered 2023-06-03: 25 ug/kg/min via INTRAVENOUS

## 2023-06-03 MED ORDER — OXYCODONE HCL 5 MG/5ML PO SOLN: 5.0000 mg | Freq: Once | ORAL | Status: DC | PRN

## 2023-06-03 MED ORDER — CEFAZOLIN SODIUM-DEXTROSE 2-4 GM/100ML-% IV SOLN
2.0000 g | INTRAVENOUS | Status: AC
  Filled 2023-06-03: qty 100

## 2023-06-03 MED ORDER — LORAZEPAM 0.5 MG PO TABS: 0.5000 mg | ORAL_TABLET | Freq: Every day | ORAL | Status: DC | PRN

## 2023-06-03 MED ORDER — BUPIVACAINE-MELOXICAM ER 400-12 MG/14ML IJ SOLN
INTRAMUSCULAR | Status: DC | PRN
  Administered 2023-06-03: 400 mg

## 2023-06-03 MED ORDER — SORBITOL 70 % SOLN
30.0000 mL | Freq: Every day | Status: DC | PRN
Start: 2023-06-03 — End: 2023-06-06

## 2023-06-03 MED ORDER — CHLORHEXIDINE GLUCONATE 0.12 % MT SOLN
15.0000 mL | Freq: Once | OROMUCOSAL | Status: AC
Start: 2023-06-03 — End: 2023-06-03
  Administered 2023-06-03: 15 mL via OROMUCOSAL
  Filled 2023-06-03: qty 15

## 2023-06-03 MED ORDER — PHENYLEPHRINE HCL-NACL 20-0.9 MG/250ML-% IV SOLN
INTRAVENOUS | Status: DC | PRN
  Administered 2023-06-03: 30 ug/min via INTRAVENOUS

## 2023-06-03 MED ORDER — MIDAZOLAM HCL 2 MG/2ML IJ SOLN: 0.5000 mg | Freq: Once | INTRAMUSCULAR | Status: DC | PRN

## 2023-06-03 MED ORDER — OXYCODONE HCL 5 MG PO TABS
5.0000 mg | ORAL_TABLET | ORAL | Status: DC | PRN
  Administered 2023-06-05: 5 mg via ORAL

## 2023-06-03 MED ORDER — ONDANSETRON HCL 4 MG/2ML IJ SOLN
4.0000 mg | Freq: Four times a day (QID) | INTRAMUSCULAR | Status: DC | PRN
  Administered 2023-06-03: 4 mg via INTRAVENOUS
  Filled 2023-06-03: qty 2

## 2023-06-03 MED ORDER — PRONTOSAN WOUND IRRIGATION OPTIME
TOPICAL | Status: DC | PRN
  Administered 2023-06-03: 350 mL

## 2023-06-03 MED ORDER — DOXYCYCLINE HYCLATE 100 MG PO TABS
100.0000 mg | ORAL_TABLET | Freq: Two times a day (BID) | ORAL | Status: DC
  Administered 2023-06-03 – 2023-06-06 (×7): 100 mg via ORAL
  Filled 2023-06-03 (×7): qty 1

## 2023-06-03 MED ORDER — ONDANSETRON HCL 4 MG PO TABS
4.0000 mg | ORAL_TABLET | Freq: Four times a day (QID) | ORAL | Status: DC | PRN
Start: 2023-06-03 — End: 2023-06-06

## 2023-06-03 MED ORDER — MIDAZOLAM HCL 2 MG/2ML IJ SOLN
INTRAMUSCULAR | Status: AC
  Filled 2023-06-03: qty 2

## 2023-06-03 MED ORDER — ACETAMINOPHEN 500 MG PO TABS
1000.0000 mg | ORAL_TABLET | Freq: Four times a day (QID) | ORAL | Status: AC
  Administered 2023-06-03 – 2023-06-04 (×4): 1000 mg via ORAL
  Filled 2023-06-03 (×5): qty 2

## 2023-06-03 MED ORDER — FERROUS SULFATE 325 (65 FE) MG PO TABS
325.0000 mg | ORAL_TABLET | Freq: Three times a day (TID) | ORAL | Status: DC
  Administered 2023-06-03 – 2023-06-06 (×9): 325 mg via ORAL
  Filled 2023-06-03 (×10): qty 1

## 2023-06-03 MED ORDER — LACTATED RINGERS IV SOLN: INTRAVENOUS | Status: AC

## 2023-06-03 MED ORDER — VANCOMYCIN HCL 1 G IV SOLR
INTRAVENOUS | Status: DC | PRN
  Administered 2023-06-03: 1000 mg

## 2023-06-03 MED ORDER — MAGNESIUM CITRATE PO SOLN: 1.0000 | Freq: Once | ORAL | Status: DC | PRN

## 2023-06-03 MED ORDER — ALBUTEROL SULFATE HFA 108 (90 BASE) MCG/ACT IN AERS: 2.0000 | INHALATION_SPRAY | Freq: Four times a day (QID) | RESPIRATORY_TRACT | Status: DC | PRN

## 2023-06-03 MED ORDER — MIDAZOLAM HCL 5 MG/5ML IJ SOLN
INTRAMUSCULAR | Status: DC | PRN
  Administered 2023-06-03 (×2): 1 mg via INTRAVENOUS

## 2023-06-03 MED ORDER — DIPHENHYDRAMINE HCL 12.5 MG/5ML PO ELIX: 25.0000 mg | ORAL_SOLUTION | ORAL | Status: DC | PRN

## 2023-06-03 MED ORDER — EPHEDRINE SULFATE-NACL 50-0.9 MG/10ML-% IV SOSY
PREFILLED_SYRINGE | INTRAVENOUS | Status: DC | PRN
  Administered 2023-06-03: 5 mg via INTRAVENOUS
  Administered 2023-06-03 (×2): 10 mg via INTRAVENOUS

## 2023-06-03 MED ORDER — CEFAZOLIN SODIUM-DEXTROSE 2-4 GM/100ML-% IV SOLN
2.0000 g | Freq: Four times a day (QID) | INTRAVENOUS | Status: AC
  Administered 2023-06-03 (×2): 2 g via INTRAVENOUS
  Filled 2023-06-03 (×2): qty 100

## 2023-06-03 MED ORDER — SODIUM CHLORIDE 0.9 % IV SOLN: INTRAVENOUS | Status: DC | PRN

## 2023-06-03 MED ORDER — PROPOFOL 1000 MG/100ML IV EMUL
INTRAVENOUS | Status: AC
  Filled 2023-06-03: qty 100

## 2023-06-03 MED ORDER — HYDROMORPHONE HCL 1 MG/ML IJ SOLN: 0.5000 mg | INTRAMUSCULAR | Status: DC | PRN

## 2023-06-03 MED ORDER — ALUM & MAG HYDROXIDE-SIMETH 200-200-20 MG/5ML PO SUSP: 30.0000 mL | ORAL | Status: DC | PRN

## 2023-06-03 MED ORDER — DEXAMETHASONE SODIUM PHOSPHATE 10 MG/ML IJ SOLN
10.0000 mg | Freq: Once | INTRAMUSCULAR | Status: AC
  Administered 2023-06-04: 10 mg via INTRAVENOUS
  Filled 2023-06-03: qty 1

## 2023-06-03 MED ORDER — MENTHOL 3 MG MT LOZG: 1.0000 | LOZENGE | OROMUCOSAL | Status: DC | PRN

## 2023-06-03 MED ORDER — MUPIROCIN 2 % EX OINT: 1.0000 | TOPICAL_OINTMENT | Freq: Two times a day (BID) | CUTANEOUS | 0 refills | Status: AC

## 2023-06-03 MED ORDER — METHOCARBAMOL 1000 MG/10ML IJ SOLN: 500.0000 mg | Freq: Four times a day (QID) | INTRAMUSCULAR | Status: DC | PRN

## 2023-06-03 MED ORDER — BUPIVACAINE-MELOXICAM ER 400-12 MG/14ML IJ SOLN
INTRAMUSCULAR | Status: AC
  Filled 2023-06-03: qty 1

## 2023-06-03 MED ORDER — METHOCARBAMOL 500 MG PO TABS
500.0000 mg | ORAL_TABLET | Freq: Four times a day (QID) | ORAL | Status: DC | PRN
  Administered 2023-06-05 – 2023-06-06 (×2): 500 mg via ORAL
  Filled 2023-06-03 (×2): qty 1

## 2023-06-03 MED ORDER — OXYCODONE HCL 5 MG PO TABS: 5.0000 mg | ORAL_TABLET | Freq: Once | ORAL | Status: DC | PRN

## 2023-06-03 MED ORDER — OXYCODONE HCL 5 MG PO TABS
10.0000 mg | ORAL_TABLET | ORAL | Status: DC | PRN
  Filled 2023-06-03: qty 3

## 2023-06-03 MED ORDER — 0.9 % SODIUM CHLORIDE (POUR BTL) OPTIME
TOPICAL | Status: DC | PRN
  Administered 2023-06-03: 1000 mL

## 2023-06-03 MED ORDER — POVIDONE-IODINE 10 % EX SWAB
2.0000 | Freq: Once | CUTANEOUS | Status: AC
  Administered 2023-06-03: 2 via TOPICAL

## 2023-06-03 MED ORDER — SODIUM CHLORIDE 0.9 % IR SOLN
Status: DC | PRN
  Administered 2023-06-03: 1000 mL

## 2023-06-03 MED ORDER — METOCLOPRAMIDE HCL 5 MG PO TABS: 5.0000 mg | ORAL_TABLET | Freq: Three times a day (TID) | ORAL | Status: DC | PRN

## 2023-06-03 MED ORDER — BUPIVACAINE IN DEXTROSE 0.75-8.25 % IT SOLN
INTRATHECAL | Status: DC | PRN
  Administered 2023-06-03: 12 mg via INTRATHECAL

## 2023-06-03 MED ORDER — TRANEXAMIC ACID-NACL 1000-0.7 MG/100ML-% IV SOLN
1000.0000 mg | INTRAVENOUS | Status: AC
  Administered 2023-06-03: 1000 mg via INTRAVENOUS
  Filled 2023-06-03: qty 100

## 2023-06-03 SURGICAL SUPPLY — 55 items
BAG COUNTER SPONGE SURGICOUNT (BAG) ×1 IMPLANT
BAG DECANTER FOR FLEXI CONT (MISCELLANEOUS) ×1 IMPLANT
BLADE SAG 18X100X1.27 (BLADE) ×1 IMPLANT
COVER PERINEAL POST (MISCELLANEOUS) ×1 IMPLANT
COVER SURGICAL LIGHT HANDLE (MISCELLANEOUS) ×1 IMPLANT
CUP SECTOR GRIPTON 50MM (Cup) IMPLANT
DERMABOND ADVANCED .7 DNX12 (GAUZE/BANDAGES/DRESSINGS) IMPLANT
DRAPE C-ARM 42X72 X-RAY (DRAPES) ×1 IMPLANT
DRAPE POUCH INSTRU U-SHP 10X18 (DRAPES) ×1 IMPLANT
DRAPE STERI IOBAN 125X83 (DRAPES) ×1 IMPLANT
DRAPE U-SHAPE 47X51 STRL (DRAPES) ×2 IMPLANT
DRSG AQUACEL AG ADV 3.5X10 (GAUZE/BANDAGES/DRESSINGS) ×1 IMPLANT
DURAPREP 26ML APPLICATOR (WOUND CARE) ×2 IMPLANT
ELECT BLADE 4.0 EZ CLEAN MEGAD (MISCELLANEOUS) ×1 IMPLANT
ELECT REM PT RETURN 9FT ADLT (ELECTROSURGICAL) ×1 IMPLANT
ELECTRODE BLDE 4.0 EZ CLN MEGD (MISCELLANEOUS) ×1 IMPLANT
ELECTRODE REM PT RTRN 9FT ADLT (ELECTROSURGICAL) ×1 IMPLANT
GLOVE BIOGEL PI IND STRL 7.0 (GLOVE) ×2 IMPLANT
GLOVE BIOGEL PI IND STRL 7.5 (GLOVE) ×5 IMPLANT
GLOVE ECLIPSE 7.0 STRL STRAW (GLOVE) ×2 IMPLANT
GLOVE SKINSENSE STRL SZ7.5 (GLOVE) ×1 IMPLANT
GLOVE SURG SYN 7.5 E (GLOVE) ×2 IMPLANT
GLOVE SURG SYN 7.5 PF PI (GLOVE) ×2 IMPLANT
GLOVE SURG UNDER POLY LF SZ7 (GLOVE) ×3 IMPLANT
GLOVE SURG UNDER POLY LF SZ7.5 (GLOVE) ×2 IMPLANT
GOWN STRL REUS W/ TWL LRG LVL3 (GOWN DISPOSABLE) IMPLANT
GOWN STRL REUS W/ TWL XL LVL3 (GOWN DISPOSABLE) ×1 IMPLANT
GOWN STRL SURGICAL XL XLNG (GOWN DISPOSABLE) ×1 IMPLANT
GOWN TOGA ZIPPER T7+ PEEL AWAY (MISCELLANEOUS) ×2 IMPLANT
HEAD FEM STD 32X+1 STRL (Hips) IMPLANT
HOOD PEEL AWAY T7 (MISCELLANEOUS) ×1 IMPLANT
IV NS IRRIG 3000ML ARTHROMATIC (IV SOLUTION) ×1 IMPLANT
KIT BASIN OR (CUSTOM PROCEDURE TRAY) ×1 IMPLANT
LINER ACET PNNCL PLUS4 NEUTRAL (Hips) IMPLANT
MARKER SKIN DUAL TIP RULER LAB (MISCELLANEOUS) ×1 IMPLANT
NDL SPNL 18GX3.5 QUINCKE PK (NEEDLE) ×1 IMPLANT
NEEDLE SPNL 18GX3.5 QUINCKE PK (NEEDLE) ×1 IMPLANT
PACK TOTAL JOINT (CUSTOM PROCEDURE TRAY) ×1 IMPLANT
PACK UNIVERSAL I (CUSTOM PROCEDURE TRAY) ×1 IMPLANT
PINNACLE PLUS 4 NEUTRAL (Hips) ×1 IMPLANT
SCREW 6.5MMX25MM (Screw) IMPLANT
SET HNDPC FAN SPRY TIP SCT (DISPOSABLE) ×1 IMPLANT
SOLUTION PRONTOSAN WOUND 350ML (IRRIGATION / IRRIGATOR) ×1 IMPLANT
STAPLER VISISTAT 35W (STAPLE) IMPLANT
STEM FEMORAL SZ 5MM STD ACTIS (Stem) IMPLANT
SUT ETHIBOND 2 V 37 (SUTURE) ×1 IMPLANT
SUT VIC AB 0 CT1 27XBRD ANBCTR (SUTURE) ×1 IMPLANT
SUT VIC AB 1 CTX36XBRD ANBCTR (SUTURE) ×1 IMPLANT
SUT VIC AB 2-0 CT1 TAPERPNT 27 (SUTURE) ×2 IMPLANT
SYR 50ML LL SCALE MARK (SYRINGE) ×1 IMPLANT
TOWEL GREEN STERILE (TOWEL DISPOSABLE) ×1 IMPLANT
TRAY CATH INTERMITTENT SS 16FR (CATHETERS) IMPLANT
TRAY FOLEY W/BAG SLVR 16FR ST (SET/KITS/TRAYS/PACK) IMPLANT
TUBE SUCT ARGYLE STRL (TUBING) ×1 IMPLANT
YANKAUER SUCT BULB TIP NO VENT (SUCTIONS) ×1 IMPLANT

## 2023-06-03 NOTE — Evaluation (Signed)
Physical Therapy Evaluation Patient Details Name: Lindsay Erickson MRN: [id number] DOB: [birth year] Today's Date: 06/03/2023  History of Present Illness  Patient is 63 y.o. female s/p Rt THA anterior approach on 06/01/23. PMH significant for anemia, anxiety/depresssion, OA, benign tremor, DM, HLD, PVD, seizures, diplopia, brain aneurysm in 2022.   Clinical Impression  Lindsay Erickson is a 63 y.o. female POD 0 s/p Rt THA. Patient reports mobilizing at [facility] Ind level with RW until ~1-2 weeks PTA. Over last week pt's spouse has been assisting with transfers and gait and pt requires assist for all ADL's at baseline secondary to essential tremor. Patient is now limited by functional impairments (see PT problem list below) and requires mod-max assist for bed mobility today due to pain, decreased motor control, and weakness. Patient c/o dizzines, nausea, and lightheadedness seated EOB and BP noted to be 89/52 mmHg. Max assist to return to supine and pt reported symptoms resolved. Patient instructed in exercise to facilitate ROM and circulation to manage edema. Patient will benefit from continued skilled PT interventions to address impairments and progress towards PLOF. Acute PT will follow to progress mobility and stair training in preparation for safe discharge home.         If plan is discharge home, recommend the following: A lot of help with walking and/or transfers;A lot of help with bathing/dressing/bathroom;Assistance with cooking/housework;Assistance with feeding;Assist for transportation;Help with stairs or ramp for entrance;Direct supervision/assist for medications management   Can travel by private vehicle        Equipment Recommendations None recommended by PT  Recommendations for Other Services       Functional Status Assessment Patient has had a recent decline in their functional status and demonstrates the ability to make significant improvements in function in a reasonable and  predictable amount of time.     Precautions / Restrictions Precautions Precautions: Fall Precaution Comments: denies falls in last 6 months Restrictions Weight Bearing Restrictions Per Provider Order: No      Mobility  Bed Mobility Overal bed mobility: Needs Assistance Bed Mobility: Supine to Sit, Sit to Supine     Supine to sit: Mod assist, Used rails, HOB elevated Sit to supine: Mod assist, Max assist   General bed mobility comments: cues for use of belt to assist Rt LE off EOB. pt required mod assist to fully pivot LE's off edge and raise trunk. pt stable at EOB. Max assist to return to supine as pt dizzy and nauseous with sitting and BP low.    Transfers                   General transfer comment: deferred due to hypotension    Ambulation/Gait                  Stairs            Wheelchair Mobility     Tilt Bed    Modified Rankin (Stroke Patients Only)       Balance Overall balance assessment: Needs assistance Sitting-balance support: Bilateral upper extremity supported, Feet supported Sitting balance-Leahy Scale: Fair                                       Pertinent Vitals/Pain Pain Assessment Pain Assessment: 0-10 Pain Location: Rt hip Pain Descriptors / Indicators: Aching, Discomfort Pain Intervention(s): Monitored during session, Repositioned, Limited activity within patient's tolerance, Ice applied  Home Living Family/patient expects to be discharged to:: Private residence Living Arrangements: Spouse/significant other Available Help at Discharge: Family Type of Home: House Home Access: Stairs to enter Entrance Stairs-Rails: Right;Left;Can reach both Entrance Stairs-Number of Steps: 3   Home Layout: One level Home Equipment: Agricultural consultant (2 wheels);Tub bench;BSC/3in1;Wheelchair - manual (BSC over toilet) Additional Comments: pt's sister is planning to come and assist during pt's recovery.    Prior  Function Prior Level of Function : Needs assist       Physical Assist : Mobility (physical);ADLs (physical) Mobility (physical): Transfers;Gait;Stairs ADLs (physical): Bathing;Feeding;Grooming;Dressing (has needed some assist with toileting due to increased pain over last week) Mobility Comments: uses RW for transfers and gait, using daily since November. prior to Nov using walker as needed. over last week gait has worsened with increased pain. Spouse has been assisting pt to stand and walking with her for safety. ADLs Comments: Spouse helps with all ADL's and does home tasks.     Extremity/Trunk Assessment   Upper Extremity Assessment Upper Extremity Assessment: Generalized weakness    Lower Extremity Assessment Lower Extremity Assessment: Generalized weakness    Cervical / Trunk Assessment Cervical / Trunk Assessment: Kyphotic  Communication   Communication Communication: No apparent difficulties (mumble speech due to tremor)  Cognition Arousal: Alert Behavior During Therapy: WFL for tasks assessed/performed Overall Cognitive Status: Within Functional Limits for tasks assessed                                          General Comments      Exercises Total Joint Exercises Ankle Circles/Pumps: AROM, Both, 15 reps Quad Sets: AROM, Right, 10 reps Heel Slides: AAROM, Right, 10 reps   Assessment/Plan    PT Assessment Patient needs continued PT services  PT Problem List Decreased strength;Decreased range of motion;Decreased activity tolerance;Decreased balance;Decreased mobility;Decreased coordination;Decreased knowledge of use of DME;Decreased safety awareness;Decreased knowledge of precautions;Pain       PT Treatment Interventions DME instruction;Gait training;Stair training;Functional mobility training;Therapeutic activities;Therapeutic exercise;Balance training;Neuromuscular re-education;Patient/family education;Wheelchair mobility training    PT Goals  (Current goals can be found in the Care Plan section)  Acute Rehab PT Goals Patient Stated Goal: recover mobility and hip to hurt less PT Goal Formulation: With patient/family Time For Goal Achievement: 06/10/23 Potential to Achieve Goals: Good    Frequency Min 1X/week     Co-evaluation               AM-PAC PT "6 Clicks" Mobility  Outcome Measure Help needed turning from your back to your side while in a flat bed without using bedrails?: A Little Help needed moving from lying on your back to sitting on the side of a flat bed without using bedrails?: A Little Help needed moving to and from a bed to a chair (including a wheelchair)?: Total Help needed standing up from a chair using your arms (e.g., wheelchair or bedside chair)?: Total Help needed to walk in hospital room?: Total Help needed climbing 3-5 steps with a railing? : Total 6 Click Score: 10    End of Session Equipment Utilized During Treatment: Gait belt Activity Tolerance: Patient tolerated treatment well Patient left: in bed;with call bell/phone within reach;with bed alarm set;with family/visitor present Nurse Communication: Mobility status PT Visit Diagnosis: Other abnormalities of gait and mobility (R26.89);Muscle weakness (generalized) (M62.81);Difficulty in walking, not elsewhere classified (R26.2);Unsteadiness on feet (R26.81);Pain Pain - Right/Left:  Right Pain - part of body: Hip    Time: 1521-1550 PT Time Calculation (min) (ACUTE ONLY): 29 min   Charges:   PT Evaluation $PT Eval Moderate Complexity: 1 Mod PT Treatments $Therapeutic Activity: 8-22 mins PT General Charges $$ ACUTE PT VISIT: 1 Visit         Wynn Maudlin, DPT Acute Rehabilitation Services Office [phone]  06/03/23 4:12 PM

## 2023-06-03 NOTE — Anesthesia Postprocedure Evaluation (Signed)
Anesthesia Post Note  Patient: Lindsay Erickson  Procedure(s) Performed: RIGHT TOTAL HIP ARTHROPLASTY ANTERIOR APPROACH (Right: Hip)     Patient location during evaluation: PACU Anesthesia Type: Spinal Level of consciousness: awake and alert, patient cooperative and oriented Pain management: pain level controlled Vital Signs Assessment: post-procedure vital signs reviewed and stable Respiratory status: nonlabored ventilation, spontaneous breathing and respiratory function stable Cardiovascular status: blood pressure returned to baseline and stable Postop Assessment: spinal receding and no apparent nausea or vomiting Anesthetic complications: no   There were no known notable events for this encounter.  Last Vitals:  Vitals:   06/03/23 1330 06/03/23 1417  BP: 106/88 111/77  Pulse: 96 100  Resp: 18 16  Temp: 36.4 C 36.6 C  SpO2: 96% 98%    Last Pain:  Vitals:   06/03/23 1417  TempSrc: Oral  PainSc:                  Aymee Fomby,E. Armetta Henri

## 2023-06-03 NOTE — Op Note (Signed)
RIGHT TOTAL HIP ARTHROPLASTY ANTERIOR APPROACH  Procedure Note Lindsay Erickson   [id number]  Pre-op Diagnosis: right hip avasular necrosis     Post-op Diagnosis: same  Operative Findings Delaminated cartilage and collapsed subchondral bone Joint effusion, turbid fluid - 2 intraoperative cultures   Operative Procedures  1. Total hip replacement; Right hip; uncemented [id number]   Surgeon: Gershon Mussel, M.D.  Assist: Oneal Grout, PA-C   Anesthesia: spinal  Prosthesis: Depuy Acetabulum: Pinnacle 50 mm Femur: Actis 5 STD Head: 32 mm size: +1.5 Liner: +4 Bearing Type: metal/poly  Total Hip Arthroplasty (Anterior Approach) Op Note:  After informed consent was obtained and the operative extremity marked in the holding area, the patient was brought back to the operating room and placed supine on the HANA table. Next, the operative extremity was prepped and draped in normal sterile fashion. Surgical timeout occurred verifying patient identification, surgical site, surgical procedure and administration of antibiotics.  A bikini incision was made across the anterior aspect of the hip.  A Hueter approach to the hip was performed, using the interval between tensor fascia lata and sartorius.  Dissection was carried bluntly down onto the anterior hip capsule. The lateral femoral circumflex vessels were identified and coagulated. A capsulotomy was performed and the capsular flaps tagged for later repair.  Turbid fluid was encountered in the joint.  Anaerobic and aerobic cultures were taken x 2.  The neck osteotomy was performed. The femoral head was removed which showed classic AVN with collapsed subchondral bone and delaminated cartilage, the acetabular rim was cleared of soft tissue and osteophytes and attention was turned to reaming the acetabulum.  Sequential reaming was performed under fluoroscopic guidance down to the floor of the cotyloid fossa. We reamed to a size 49 mm, and then  impacted the acetabular shell. Two 25 mm cancellous screws were placed to secure the shell.  The liner was then placed after irrigation and attention turned to the femur.  After placing the femoral hook, the leg was taken to externally rotated, extended and adducted position taking care to perform soft tissue releases to allow for adequate mobilization of the femur. Soft tissue was cleared from the shoulder of the greater trochanter and the hook elevator used to improve exposure of the proximal femur. Sequential broaching performed up to a size 5. Trial neck and head were placed. The leg was brought back up to neutral and the construct reduced.  The position and sizing of components, offset and leg lengths were checked using fluoroscopy. Stability of the construct was checked in 45 degrees of hip extension and 90 degrees of external rotation without any subluxation, shuck or impingement of prosthesis. We dislocated the prosthesis, dropped the leg back into position, removed trial components, and irrigated copiously. The final stem and head was then placed, the leg brought back up, the system reduced and fluoroscopy used to verify positioning.  Antibiotic irrigation was placed in the surgical wound.   We irrigated, obtained hemostasis and closed the capsule using #2 ethibond suture.  A topical mixture of 0.25% bupivacaine and meloxicam was placed deep to the fascia.  One gram of vancomycin powder was placed in the surgical bed.   One gram of topical tranexamic acid was injected into the joint.  The fascia was closed with #1 vicryl plus, the deep fat layer was closed with 0 vicryl, the subcutaneous layers closed with 2.0 Vicryl Plus and the skin closed with 2.0 nylon and dermabond. A sterile dressing was applied.  The patient was awakened in the operating room and taken to recovery in stable condition.  All sponge, needle, and instrument counts were correct at the end of the case.   Tessa Lerner, my PA, was a  medical necessity for opening, closing, limb positioning, retracting, exposing, and overall facilitation and timely completion of the surgery.  Position: supine  Complications: see description of procedure.  Time Out: performed   Drains/Packing: none  Estimated blood loss: see anesthesia record  Returned to Recovery Room: in good condition.   Antibiotics: yes   Mechanical VTE (DVT) Prophylaxis: sequential compression devices, TED thigh-high  Chemical VTE (DVT) Prophylaxis: resume aspirin and plavix   Fluid Replacement: see anesthesia record  Specimens Removed: 1 to pathology   Sponge and Instrument Count Correct? yes   PACU: portable radiograph - low AP   Plan/RTC: Return in 2 weeks for staple removal. Weight Bearing/Load Lower Extremity: full  Hip precautions: none Suture Removal: 2 weeks   N. Glee Arvin, MD [facility] 11:56 AM   Implant Name Type Inv. Item Serial No. Manufacturer Lot No. LRB No. Used Action  CUP SECTOR GRIPTON - [id number] Cup CUP SECTOR GRIPTON  [employer] [id number] Right 1 Implanted  PINNACLE PLUS 4 NEUTRAL - [id number] Hips PINNACLE PLUS 4 NEUTRAL  [employer] [id number] Right 1 Implanted  SCREW 6.5MMX25MM - [id number] Screw SCREW 6.5MMX25MM  [employer] [id number] Right 1 Implanted  SCREW 6.5MMX25MM - [id number] Screw SCREW 6.5MMX25MM  [employer] [id number] Right 1 Implanted  STEM FEMORAL SZ STD ACTIS - [id number] Stem STEM FEMORAL SZ STD ACTIS  [employer] [id number] Right 1 Implanted  HEAD FEM STD 32X+1 STRL - [id number] Hips HEAD FEM STD 32X+1 STRL  [employer] [id number] Right 1 Implanted

## 2023-06-03 NOTE — Transfer of Care (Signed)
Immediate Anesthesia Transfer of Care Note  Patient: Lindsay Erickson  Procedure(s) Performed: RIGHT TOTAL HIP ARTHROPLASTY ANTERIOR APPROACH (Right: Hip)  Patient Location: PACU  Anesthesia Type:MAC  Level of Consciousness: awake and drowsy  Airway & Oxygen Therapy: Patient Spontanous Breathing  Post-op Assessment: Report given to RN and Post -op Vital signs reviewed and stable  Post vital signs: Reviewed and stable  Last Vitals:  Vitals Value Taken Time  BP 119/67 06/03/23 1240  Temp    Pulse 76 06/03/23 1243  Resp 24 06/03/23 1243  SpO2 99 % 06/03/23 1243  Vitals shown include unfiled device data.  Last Pain:  Vitals:   06/03/23 0852  TempSrc:   PainSc: 8       Patients Stated Pain Goal: 3 (06/03/23 5643)  Complications: No notable events documented.

## 2023-06-03 NOTE — Discharge Instructions (Signed)

## 2023-06-03 NOTE — H&P (Signed)
PREOPERATIVE H&P  Chief Complaint: right hip avasular necrosis  HPI: Lindsay Erickson is a 63 y.o. female who presents for surgical treatment of right hip avasular necrosis.  She denies any changes in medical history.  Past Surgical History:  Procedure Laterality Date   APPLICATION OF WOUND VAC Right 05/14/2020   Procedure: APPLICATION OF WOUND VAC;  Surgeon: Leonie Douglas, MD;  Location: [facility] OR;  Service: Vascular;  Laterality: Right;   BREAST BIOPSY Left    X3   CARPAL TUNNEL RELEASE     CATARACT EXTRACTION Bilateral    FALSE ANEURYSM REPAIR Right 05/14/2020   Procedure: REPAIR FALSE ANEURYSM;  Surgeon: Leonie Douglas, MD;  Location: [facility] OR;  Service: Vascular;  Laterality: Right;   IR 3D INDEPENDENT WKST  03/18/2021   IR ANGIO INTRA EXTRACRAN SEL COM CAROTID INNOMINATE BILAT MOD SED  01/12/2020   IR ANGIO INTRA EXTRACRAN SEL INTERNAL CAROTID UNI L MOD SED  05/14/2020   IR ANGIO INTRA EXTRACRAN SEL INTERNAL CAROTID UNI R MOD SED  03/18/2021   IR ANGIO VERTEBRAL SEL VERTEBRAL BILAT MOD SED  01/12/2020   IR ANGIOGRAM FOLLOW UP STUDY  05/14/2020   IR ANGIOGRAM FOLLOW UP STUDY  05/14/2020   IR RADIOLOGIST EVAL & MGMT  06/06/2020   IR RADIOLOGIST EVAL & MGMT  12/03/2020   IR RADIOLOGIST EVAL & MGMT  04/15/2021   IR TRANSCATH/EMBOLIZ  05/14/2020   IR TRANSCATH/EMBOLIZ  03/18/2021   IR US GUIDE VASC ACCESS RIGHT  01/12/2020   IR US GUIDE VASC ACCESS RIGHT  03/18/2021   RADIOLOGY WITH ANESTHESIA N/A 05/14/2020   Procedure: IR WITH ANESTHESIA ANEURYSM EMBOLIZATION;  Surgeon: Julieanne Cotton, MD;  Location: [facility] OR;  Service: Radiology;  Laterality: N/A;   RADIOLOGY WITH ANESTHESIA N/A 03/18/2021   Procedure: Sharman Crate;  Surgeon: Julieanne Cotton, MD;  Location: [facility] OR;  Service: Radiology;  Laterality: N/A;   SALPINGOOPHORECTOMY     both ovaries removed by laparoscopic   VAGINAL HYSTERECTOMY     Social History   Socioeconomic History   Marital status: Married     Spouse name: Not on file   Number of children: Not on file   Years of education: Not on file   Highest education level: Not on file  Occupational History   Not on file  Tobacco Use   Smoking status: Every Day    Current packs/day: 0.25    Types: Cigarettes   Smokeless tobacco: Never   Tobacco comments:    2-4 cigarettes per day.  Patient states "trying to quit"  Vaping Use   Vaping status: Never Used  Substance and Sexual Activity   Alcohol use: Not Currently    Comment: rare   Drug use: Never   Sexual activity: Not on file    Comment: Hysterectomy  Other Topics Concern   Not on file  Social History Narrative   One story home mobile home   Right handed   Drinks caffeine on occasion   Social Drivers of Health   Financial Resource Strain: Not on file  Food Insecurity: Not on file  Transportation Needs: Not on file  Physical Activity: Not on file  Stress: Not on file  Social Connections: Not on file   Family History  Problem Relation Age of Onset   Heart disease Mother    Stroke Mother    Tremor Mother    Heart disease Father    Tremor Father    Atrial fibrillation Sister  Tremor Sister    Diabetes Brother    Tremor Maternal Aunt    Heart attack Maternal Uncle    Heart attack Maternal Uncle    Diabetes Maternal Grandmother    Allergies  Allergen Reactions   Effexor [Venlafaxine] Other (See Comments)    Irritable/hyper   Gabapentin Other (See Comments)    Insomnia   Primidone Other (See Comments)    Ineffective    Propranolol Swelling    Can't take beta blockers. Didn't help tremors.   Latex Itching   Codeine Itching   Nickel Itching and Rash   Prior to Admission medications   Medication Sig Start Date End Date Taking? Authorizing Provider  albuterol (VENTOLIN HFA) 108 (90 Base) MCG/ACT inhaler Inhale 2 puffs into the lungs every 6 (six) hours as needed for wheezing or shortness of breath. 09/15/22  Yes [provider]  alendronate (FOSAMAX)  70 MG tablet Take 70 mg by mouth every Wednesday. 05/15/21  Yes [provider]  aspirin EC 81 MG tablet Take 81 mg by mouth daily with lunch. Swallow whole.   Yes [provider]  calcium carbonate (OSCAL) 1500 (600 Ca) MG TABS tablet Take 1,500 mg by mouth daily with lunch.   Yes [provider]  clopidogrel (PLAVIX) 75 MG tablet Take 37.5 mg by mouth 2 (two) times a week. Monday and Thursday   Yes [provider]  diclofenac (VOLTAREN) 75 MG EC tablet Take 75 mg by mouth 2 (two) times daily after a meal. After breakfast & supper   Yes [provider]  divalproex (DEPAKOTE ER) 500 MG 24 hr tablet Take 1 tablet every night 07/07/22  Yes Van Clines, MD  famotidine (PEPCID) 20 MG tablet Take 20 mg by mouth 2 (two) times daily as needed for heartburn or indigestion.   Yes [provider]  methocarbamol (ROBAXIN-750) 750 MG tablet Take 1 tablet (750 mg total) by mouth 2 (two) times daily as needed for muscle spasms. 05/26/23  Yes Cristie Hem, PA-C  Omega-3 Fatty Acids (FISH OIL) 1000 MG CAPS Take 2,000 mg by mouth daily after lunch.   Yes [provider]  oxyCODONE-acetaminophen (PERCOCET) 5-325 MG tablet Take 1-2 tablets by mouth every 6 (six) hours as needed. To be taken after surgery 05/26/23  Yes Cristie Hem, PA-C  rosuvastatin (CRESTOR) 10 MG tablet Take 1 tablet (10 mg total) by mouth at bedtime. 07/19/21  Yes Revankar, Aundra Dubin, MD  Vitamin D, Ergocalciferol, (DRISDOL) 1.25 MG (50000 UNIT) CAPS capsule Take 50,000 Units by mouth every Wednesday. 09/24/21  Yes [provider]  docusate sodium (COLACE) 100 MG capsule Take 1 capsule (100 mg total) by mouth daily as needed. 05/26/23 05/25/24  Cristie Hem, PA-C  doxycycline (VIBRAMYCIN) 100 MG capsule Take 1 capsule (100 mg total) by mouth 2 (two) times daily. To be taken after surgery 05/26/23   Cristie Hem, PA-C  LORazepam (ATIVAN) 0.5 MG tablet Take 0.5 mg by mouth  daily as needed for anxiety. 09/10/21   [provider]  ondansetron (ZOFRAN) 4 MG tablet Take 1 tablet (4 mg total) by mouth every 8 (eight) hours as needed for nausea or vomiting. 05/26/23   Cristie Hem, PA-C     Positive ROS: All other systems have been reviewed and were otherwise negative with the exception of those mentioned in the HPI and as above.  Physical Exam: General: Alert, no acute distress Cardiovascular: No pedal edema Respiratory: No cyanosis, no  use of accessory musculature GI: abdomen soft Skin: No lesions in the area of chief complaint Neurologic: Sensation intact distally Psychiatric: Patient is competent for consent with normal mood and affect Lymphatic: no lymphedema  MUSCULOSKELETAL: exam stable  Assessment: right hip avasular necrosis  Plan: Plan for Procedure(s): RIGHT TOTAL HIP ARTHROPLASTY ANTERIOR APPROACH  The risks benefits and alternatives were discussed with the patient including but not limited to the risks of nonoperative treatment, versus surgical intervention including infection, bleeding, nerve injury,  blood clots, cardiopulmonary complications, morbidity, mortality, among others, and they were willing to proceed.   Glee Arvin, MD 06/03/2023 9:20 AM

## 2023-06-03 NOTE — Anesthesia Procedure Notes (Signed)
Spinal  Patient location during procedure: OR End time: 06/03/2023 10:35 AM Reason for block: surgical anesthesia Staffing Performed: anesthesiologist  Anesthesiologist: Jairo Ben, MD Performed by: Jairo Ben, MD Authorized by: Jairo Ben, MD   Preanesthetic Checklist Completed: patient identified, IV checked, site marked, risks and benefits discussed, surgical consent, monitors and equipment checked, pre-op evaluation and timeout performed Spinal Block Patient position: sitting Prep: DuraPrep and site prepped and draped Patient monitoring: blood pressure, continuous pulse ox, cardiac monitor and heart rate Approach: midline Location: L3-4 Injection technique: single-shot Needle Needle type: Pencan and Introducer  Needle gauge: 24 G Needle length: 9 cm Assessment Events: CSF return Additional Notes Pt identified in Operating room.  Monitors applied. Working IV access confirmed. Sterile prep, drape lumbar spine.  1% lido local L 3,4.  #24ga Pencan into clear CSF L 3,4.  12mg  0.75% Bupivacaine with dextrose injected with asp CSF beginning and end of injection.  Patient asymptomatic, VSS, no heme aspirated, tolerated well.  Sandford Craze, MD

## 2023-06-03 NOTE — Progress Notes (Signed)
-   S/p Total hip replacement; Right hip, 1/29   06/03/23 1516  TOC Brief Assessment  Insurance and Status Reviewed  Patient has primary care physician Yes  Home environment has been reviewed From home with husband  Prior level of function: PTA independent with ADL's  Prior/Current Home Services No current home services  Social Drivers of Health Review SDOH reviewed no interventions necessary  Readmission risk has been reviewed No  Transition of care needs transition of care needs identified, TOC will continue to follow   Home health services prearranged with [facility] by provider's office. Referral made with [facility] for RW and BSC. Equipment will be delivered to bedside prior to d/c. Pt without RX med or transportation concerns.  PT evaluation pending ...  TOC team following and will assist with needs.  Gae Gallop RN,BSN,CM

## 2023-06-03 NOTE — Progress Notes (Signed)
    Durable Medical Equipment  (From admission, onward)           Start     Ordered   06/03/23 1508  DME Bedside commode  Once       Comments: Confine to one room  Question:  Patient needs a bedside commode to treat with the following condition  Answer:  History of hip replacement   06/03/23 1508   06/03/23 1406  DME Walker rolling  Once       Question:  Patient needs a walker to treat with the following condition  Answer:  History of hip replacement   06/03/23 1405   06/03/23 1406  DME 3 n 1  Once        06/03/23 1405

## 2023-06-04 ENCOUNTER — Encounter (HOSPITAL_COMMUNITY): Payer: Self-pay | Admitting: Orthopaedic Surgery

## 2023-06-04 ENCOUNTER — Other Ambulatory Visit: Payer: Self-pay | Admitting: Physician Assistant

## 2023-06-04 MED ORDER — TAMSULOSIN HCL 0.4 MG PO CAPS: 0.4000 mg | ORAL_CAPSULE | Freq: Every day | ORAL | Status: DC

## 2023-06-04 MED ORDER — SODIUM CHLORIDE 0.9 % IV BOLUS
1000.0000 mL | Freq: Once | INTRAVENOUS | Status: AC
  Administered 2023-06-04: 1000 mL via INTRAVENOUS

## 2023-06-04 MED ORDER — FLEET ENEMA RE ENEM
1.0000 | ENEMA | Freq: Once | RECTAL | Status: AC
  Administered 2023-06-04: 1 via RECTAL
  Filled 2023-06-04: qty 1

## 2023-06-04 NOTE — Progress Notes (Signed)
Patient has orthostatic hypotension, PA Mikey Kirschner was made aware, NS bolus ordered. Per PA if patient still arthostatic during next PT session, patient will stay another night. See PT notes

## 2023-06-04 NOTE — Care Management Obs Status (Signed)
MEDICARE OBSERVATION STATUS NOTIFICATION   Patient Details  Name: Lindsay Erickson MRN: [id number] Date of Birth: 1960-11-11   Medicare Observation Status Notification Given:  Yes    Lorri Frederick, LCSW 06/04/2023, 9:00 AM

## 2023-06-04 NOTE — Plan of Care (Signed)

## 2023-06-04 NOTE — Evaluation (Signed)
Occupational Therapy Evaluation Patient Details Name: Lindsay Erickson MRN: [id number] DOB: 01/11/1961 Today's Date: 06/04/2023   History of Present Illness Patient is 63 y.o. female s/p Rt THA anterior approach on 06/01/23. Pt experienced symptomatic orthostatic hypotension 1/29 and 1/30 during PT sessions. PMH significant for anemia, anxiety/depresssion, OA, benign tremor, DM, HLD, PVD, seizures, diplopia, brain aneurysm in 2022.   Clinical Impression   Patient admitted for the diagnosis above.  PTA she lives at home with her spouse, who assists as needed for ADL,iADL and mobility due to declining functional status.  Patient has been living with painful hip and severe essential tremors for some time.  Patient does have needed DME, not sure hip kit will be effective for her due to tremors.  OT will continue efforts in the acute setting to address deficits, and OT will defer to [facility], if [facility] OT is needed.  Patient relies heavily on her spouse.         If plan is discharge home, recommend the following: Assist for transportation;Assistance with cooking/housework;A little help with walking and/or transfers;A lot of help with bathing/dressing/bathroom    Functional Status Assessment  Patient has had a recent decline in their functional status and demonstrates the ability to make significant improvements in function in a reasonable and predictable amount of time.  Equipment Recommendations  None recommended by OT    Recommendations for Other Services       Precautions / Restrictions Precautions Precautions: Fall Precaution Comments: denies falls in last 6 months Restrictions Weight Bearing Restrictions Per Provider Order: No      Mobility Bed Mobility Overal bed mobility: Needs Assistance Bed Mobility: Sit to Supine       Sit to supine: Mod assist        Transfers Overall transfer level: Needs assistance Equipment used: Rolling walker (2 wheels) Transfers: Sit to/from Stand,  Bed to chair/wheelchair/BSC Sit to Stand: Min assist, Mod assist     Step pivot transfers: From elevated surface, Min assist            Balance Overall balance assessment: Needs assistance Sitting-balance support: Bilateral upper extremity supported, Feet supported Sitting balance-Leahy Scale: Good     Standing balance support: Reliant on assistive device for balance Standing balance-Leahy Scale: Poor                             ADL either performed or assessed with clinical judgement   ADL   Eating/Feeding: Set up;Minimal assistance;Sitting   Grooming: Wash/dry face;Set up;Sitting           Upper Body Dressing : Minimal assistance;Moderate assistance;Sitting   Lower Body Dressing: Moderate assistance;Maximal assistance;Sit to/from stand   Toilet Transfer: Minimal assistance;Stand-pivot;Rolling walker (2 wheels);BSC/3in1                   Vision Patient Visual Report: No change from baseline       Perception Perception: Not tested       Praxis Praxis: Not tested       Pertinent Vitals/Pain Pain Assessment Pain Assessment: Faces Faces Pain Scale: Hurts little more Pain Location: Rt hip anterior and lateral aspects Pain Descriptors / Indicators: Grimacing Pain Intervention(s): Monitored during session     Extremity/Trunk Assessment Upper Extremity Assessment Upper Extremity Assessment: Generalized weakness   Lower Extremity Assessment Lower Extremity Assessment: Defer to PT evaluation   Cervical / Trunk Assessment Cervical / Trunk Assessment: Kyphotic  Communication Communication Communication: No apparent difficulties   Cognition Arousal: Alert Behavior During Therapy: WFL for tasks assessed/performed, Anxious Overall Cognitive Status: Within Functional Limits for tasks assessed                                       General Comments  Watch BP    Exercises     Shoulder Instructions      Home Living  Family/patient expects to be discharged to:: Private residence Living Arrangements: Spouse/significant other Available Help at Discharge: Family Type of Home: House Home Access: Stairs to enter Secretary/administrator of Steps: 3 Entrance Stairs-Rails: Right;Left;Can reach both Home Layout: One level     Bathroom Shower/Tub: Chief Strategy Officer: Standard Bathroom Accessibility: Yes How Accessible: Accessible via walker Home Equipment: Rolling Walker (2 wheels);Tub bench;BSC/3in1;Wheelchair - Copywriter, advertising Equipment: Long-handled sponge        Prior Functioning/Environment Prior Level of Function : Needs assist           ADLs (physical): Bathing;Feeding;Grooming;Dressing   ADLs Comments: Spouse helps with all ADL's and does home tasks.        OT Problem List: Decreased strength;Decreased range of motion;Decreased activity tolerance;Impaired balance (sitting and/or standing);Pain      OT Treatment/Interventions: Self-care/ADL training;Therapeutic activities;Patient/family education;Balance training;DME and/or AE instruction    OT Goals(Current goals can be found in the care plan section) Acute Rehab OT Goals Patient Stated Goal: Return home OT Goal Formulation: With patient Time For Goal Achievement: 06/18/23 Potential to Achieve Goals: Good ADL Goals Pt Will Transfer to Toilet: with contact guard assist;ambulating;regular height toilet  OT Frequency: Min 1X/week    Co-evaluation              AM-PAC OT "6 Clicks" Daily Activity     Outcome Measure Help from another person eating meals?: A Little Help from another person taking care of personal grooming?: A Little Help from another person toileting, which includes using toliet, bedpan, or urinal?: A Lot Help from another person bathing (including washing, rinsing, drying)?: A Lot Help from another person to put on and taking off regular upper body clothing?: A Lot Help from  another person to put on and taking off regular lower body clothing?: A Lot 6 Click Score: 14   End of Session Equipment Utilized During Treatment: Gait belt;Rolling walker (2 wheels) Nurse Communication: Mobility status  Activity Tolerance: Patient tolerated treatment well Patient left: in bed;with call bell/phone within reach;with family/visitor present  OT Visit Diagnosis: Unsteadiness on feet (R26.81);Muscle weakness (generalized) (M62.81);Ataxia, unspecified (R27.0);Pain Pain - Right/Left: Right Pain - part of body: Leg;Hip                Time: 1610-9604 OT Time Calculation (min): 22 min Charges:  OT General Charges $OT Visit: 1 Visit OT Evaluation $OT Eval Moderate Complexity: 1 Mod  06/04/2023  RP, OTR/L  Acute Rehabilitation Services  Office:  [phone]   Suzanna Obey 06/04/2023, 5:15 PM

## 2023-06-04 NOTE — Progress Notes (Addendum)
Physical Therapy Treatment Patient Details Name: Lindsay Erickson MRN: [id number] DOB: 12/21/1960 Today's Date: 06/04/2023   History of Present Illness Patient is 63 y.o. female s/p Rt THA anterior approach on 06/01/23. Pt experienced symptomatic orthostatic hypotension 1/29 and 1/30 during PT sessions. PMH significant for anemia, anxiety/depresssion, OA, benign tremor, DM, HLD, PVD, seizures, diplopia, brain aneurysm in 2022.    PT Comments  Pt received in supine, c/o mild pain after premedication, agreeable to therapy session, spouse present and able to assist with guarding and pt mobility with cues on use of gait belt/safety. Pt limited due to symptomatic orthostatic hypotension with mild symptoms sitting, increased symptoms while standing and after pivot to BSC/recliner. Pt needing up to +2 modA for bed mobility and step pivot transfers using RW. RN notified of pt symptoms and that she reports chronic constipation and she has not had a BM in ~7 days per patient and spouse report. Pt agreeable to sit up in recliner at end of session, but does report severe 9/10 pain despite premedication and feeling hot/clammy. BP improved in recliner, pt encouraged to drink more water and PTA called dietary services to have more waters sent up to her room. Pt not yet safe to DC home given symptoms and not yet able to safely ambulate household distances or perform stairs. Anticipate pt will need 1-2 more sessions at minimum to progress mobility prior to DC, she would also benefit from OT consult due to difficulty performing ADLs without +2 assist and limited mobility due to symptoms after surgery.    If plan is discharge home, recommend the following: A lot of help with walking and/or transfers;A lot of help with bathing/dressing/bathroom;Assistance with cooking/housework;Assistance with feeding;Assist for transportation;Help with stairs or ramp for entrance;Direct supervision/assist for medications management   Can  travel by private vehicle        Equipment Recommendations  None recommended by PT (BSC already delivered to her room)    Recommendations for Other Services       Precautions / Restrictions Precautions Precautions: Fall Precaution Comments: denies falls in last 6 months Restrictions Weight Bearing Restrictions Per Provider Order: No     Mobility  Bed Mobility Overal bed mobility: Needs Assistance Bed Mobility: Supine to Sit     Supine to sit: Mod assist, +2 for safety/equipment     General bed mobility comments: cues for use of belt to assist Rt LE off EOB. pt required mod assist to fully pivot LE's off edge and raise trunk. Spouse assisting her with BLE guidance as pt unable to effectively utilize gait belt as leg lifter and PTA providing up to modA at trunk to elevate from flat bed surface without rails. Soft BP sitting EOB when assessed, pt reports feeling of weakness but not dizzy or nauseated. Pt agreeable to transfer to [facility].    Transfers Overall transfer level: Needs assistance Equipment used: Rolling walker (2 wheels) Transfers: Sit to/from Stand, Bed to chair/wheelchair/BSC Sit to Stand: Mod assist   Step pivot transfers: Mod assist, +2 safety/equipment, From elevated surface       General transfer comment: to/from higher BSC height, then to recliner chair, increased time and cues for body mechanics for safety and reduced pain. Posterior lean upon standing needing external support in addition to RW each trial to maintain upright balance and postural cues.    Ambulation/Gait Ambulation/Gait assistance: Mod assist, +2 safety/equipment Gait Distance (Feet): 6 Feet Assistive device: Rolling walker (2 wheels) Gait Pattern/deviations: Step-to pattern, Decreased step  length - right, Decreased step length - left, Decreased dorsiflexion - right, Shuffle, Decreased weight shift to right, Narrow base of support       General Gait Details: downward gaze, mod cues for  upright posture, step sequencing while managing RW, pt needing min to modA for stability via gait belt and RW assist due to difficulty maintaining upright posture and guarding/antalgic gait. Pt often letting go of RW handle with one or both hands while standing, despite cues to maintain. Pt c/o residual anterior RLE numbness. Pt with pale pallor, c/o subjective weakness and feeling hot after short distance.   Stairs Stairs:  (not yet safe to attempt)             Balance Overall balance assessment: Needs assistance Sitting-balance support: Bilateral upper extremity supported, Feet supported Sitting balance-Leahy Scale: Poor Sitting balance - Comments: posterior LOB when unsupported due to guarding/pain   Standing balance support: Bilateral upper extremity supported, Reliant on assistive device for balance Standing balance-Leahy Scale: Poor Standing balance comment: pt letting go of RW on multiple occasions, need constant external support with gait belt                            Cognition Arousal: Alert Behavior During Therapy: WFL for tasks assessed/performed, Anxious Overall Cognitive Status: Within Functional Limits for tasks assessed                                 General Comments: Pt needs repetition of safety cues for transfers and decreased carryover of cues within session. Spouse present throughout and supportive.        Exercises Total Joint Exercises Ankle Circles/Pumps: AROM, Both, 20 reps (10reps x2 sets) Quad Sets: AROM, Right, 10 reps Heel Slides: AAROM, Right, 10 reps, Supine Hip ABduction/ADduction: AAROM, Right, 10 reps, Supine Marching in Standing:  (very limited ROM)    General Comments General comments (skin integrity, edema, etc.): R hip dressing appears c/d/i; pt c/o subjective anterior R thigh numbness Vital Signs  Patient Position (if appropriate) Orthostatic Vitals  Orthostatic Lying   BP- Lying 101/73  Pulse- Lying 78   Orthostatic Sitting  BP- Sitting (!) 74/56  Pulse- Sitting 83  Orthostatic Standing at 0 minutes  BP- Standing at 0 minutes (!) 70/54  Pulse- Standing at 0 minutes 104  Orthostatic Standing at 3 minutes  BP- Standing at 3 minutes  (pt unable)   Orthostatic Lying   BP- Lying 92/58 (recliner chair after step pivots x2)       Pertinent Vitals/Pain Pain Assessment Pain Assessment: 0-10 Pain Score: 9  Pain Location: Rt hip anterior and lateral aspects Pain Descriptors / Indicators: Aching, Discomfort, Numbness, Grimacing, Operative site guarding Pain Intervention(s): Limited activity within patient's tolerance, Monitored during session, Premedicated before session, Repositioned, Ice applied    Home Living                          Prior Function            PT Goals (current goals can now be found in the care plan section) Acute Rehab PT Goals Patient Stated Goal: recover mobility and hip to hurt less PT Goal Formulation: With patient/family Time For Goal Achievement: 06/10/23 Progress towards PT goals: Progressing toward goals    Frequency    Min 1X/week  PT Plan      Co-evaluation              AM-PAC PT "6 Clicks" Mobility   Outcome Measure  Help needed turning from your back to your side while in a flat bed without using bedrails?: A Little Help needed moving from lying on your back to sitting on the side of a flat bed without using bedrails?: A Lot Help needed moving to and from a bed to a chair (including a wheelchair)?: A Lot Help needed standing up from a chair using your arms (e.g., wheelchair or bedside chair)?: A Lot Help needed to walk in hospital room?: Total Help needed climbing 3-5 steps with a railing? : Total 6 Click Score: 11    End of Session Equipment Utilized During Treatment: Gait belt Activity Tolerance: Patient limited by fatigue;Patient limited by pain;Treatment limited secondary to medical complications  (Comment);Other (comment) (symptomatic orthostatic hypotension) Patient left: with call bell/phone within reach;with family/visitor present;in chair;with chair alarm set;Other (comment) (encouraged to drink water) Nurse Communication: Mobility status;Patient requests pain meds;Other (comment) (soft BP, no BM in ~7 days) PT Visit Diagnosis: Other abnormalities of gait and mobility (R26.89);Muscle weakness (generalized) (M62.81);Difficulty in walking, not elsewhere classified (R26.2);Unsteadiness on feet (R26.81);Pain Pain - Right/Left: Right Pain - part of body: Hip     Time: 1027-1120 PT Time Calculation (min) (ACUTE ONLY): 53 min  Charges:    $Gait Training: 8-22 mins $Therapeutic Exercise: 8-22 mins $Therapeutic Activity: 23-37 mins PT General Charges $$ ACUTE PT VISIT: 1 Visit                     Navi Erber P., PTA Acute Rehabilitation Services Secure Chat Preferred 9a-5:30pm Office: [phone]    Dorathy Kinsman [facility] 06/04/2023, 11:50 AM

## 2023-06-04 NOTE — Discharge Summary (Addendum)
Patient ID: Lindsay Erickson MRN: [id number] DOB/AGE: [birth year] 63 y.o.  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   Aortic arch atherosclerosis (HCC)    Aortic atherosclerosis (HCC)    Arthritis    knee   Benign familial tremor 06/17/2017   BMI 22.0-22.9, adult    Brain aneurysm 05/14/2020   Cardiac murmur 07/11/2020   no current problems   Chest discomfort 06/22/2018   Chronic tension-type headache, not intractable 05/10/2014   Cigarette smoker 01/16/2021   Constipation    03/15/21 - improved   Depression    Diabetes (HCC)    type 2   Diplopia 09/30/2017   Dyslipidemia    Essential tremor    History of blood transfusion    2 units after a surgical procedure   History of kidney stones    passed stones   HPV in female    Hyperlipidemia    Intracranial aneurysm 05/14/2020   Memory loss    mild   Migraine without aura and without status migrainosus, not intractable 06/17/2017   Mixed dyslipidemia 06/22/2018   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   Peripheral vascular disease (HCC)    blood clot in leg after being kicked by a horse - age 29   Pneumonia    PONV (postoperative nausea and vomiting)    just 1 time after breast biopsy and headache, no anesthesia problems with other surgeries   Seizure disorder (HCC)    last seizure 1980s- controlled with meds   Tremor, essential    Vitamin D deficiency     Surgeries: Procedure(s): RIGHT TOTAL  HIP ARTHROPLASTY ANTERIOR APPROACH on 06/03/2023   Consultants (if any):   Discharged Condition: Improved  Hospital Course: Lindsay Erickson is an 63 y.o. female who was admitted 06/03/2023 with a diagnosis of Primary osteoarthritis of right hip and went to the operating room on 06/03/2023 and underwent the above named procedures.    She was given perioperative antibiotics:  Anti-infectives (From admission, onward)    Start     Dose/Rate Route Frequency Ordered Stop   06/03/23 1500  doxycycline (VIBRA-TABS) tablet 100 mg  Status:  Discontinued       Note to Pharmacy: To be taken after surgery     100 mg Oral 2 times daily 06/03/23 1405 06/06/23 1740   06/03/23 1500  ceFAZolin (ANCEF) IVPB 2g/100 mL premix        2 g 200 mL/hr over 30 Minutes Intravenous Every 6 hours 06/03/23 1405 06/03/23 2159   06/03/23 1119  vancomycin (VANCOCIN) powder  Status:  Discontinued          As needed 06/03/23 1119 06/03/23 1233   06/03/23 0830  ceFAZolin (ANCEF) IVPB 2g/100 mL premix        2 g 200 mL/hr over 30 Minutes Intravenous On call to O.R. 06/03/23 6962 06/04/23 0559   06/03/23 0830  vancomycin (VANCOCIN) IVPB 1000 mg/200 mL premix  1,000 mg 200 mL/hr over 60 Minutes Intravenous  Once 06/03/23 0823 06/03/23 0956     .  She was given sequential compression devices, early ambulation, and appropriate chemoprophylaxis for DVT prophylaxis.  She benefited maximally from the hospital stay and there were no complications.    Recent vital signs:  Vitals:   06/06/23 0531 06/06/23 0735  BP: (!) 92/51 (!) 97/48  Pulse: 80 83  Resp: 15   Temp:  98.4 F (36.9 C)  SpO2: 99% 100%    Recent laboratory studies:  Lab Results  Component Value Date   HGB 8.6 (L) 06/05/2023   HGB 12.4 05/29/2023   HGB 13.6 07/19/2021   Lab Results  Component Value Date   WBC 8.0 05/29/2023   PLT 282 05/29/2023   Lab Results  Component Value Date   INR 1.0 03/18/2021   Lab Results  Component Value  Date   NA 142 05/29/2023   K 4.0 05/29/2023   CL 105 05/29/2023   CO2 26 05/29/2023   BUN 28 (H) 05/29/2023   CREATININE 0.89 05/29/2023   GLUCOSE 91 05/29/2023    Discharge Medications:   Allergies as of 06/06/2023       Reactions   Effexor [venlafaxine] Other (See Comments)   Irritable/hyper   Gabapentin Other (See Comments)   Insomnia   Primidone Other (See Comments)   Ineffective    Propranolol Swelling   Can't take beta blockers. Didn't help tremors.   Latex Itching   Codeine Itching   Nickel Itching, Rash        Medication List     STOP taking these medications    diclofenac 75 MG EC tablet Commonly known as: VOLTAREN   Fish Oil 1000 MG Caps       TAKE these medications    albuterol 108 (90 Base) MCG/ACT inhaler Commonly known as: VENTOLIN HFA Inhale 2 puffs into the lungs every 6 (six) hours as needed for wheezing or shortness of breath.   alendronate 70 MG tablet Commonly known as: FOSAMAX Take 70 mg by mouth every Wednesday.   aspirin EC 81 MG tablet Take 81 mg by mouth daily with lunch. Swallow whole.   calcium carbonate 1500 (600 Ca) MG Tabs tablet Commonly known as: OSCAL Take 1,500 mg by mouth daily with lunch.   chlorhexidine 4 % external liquid Commonly known as: HIBICLENS Apply 15 mLs (1 Application total) topically as directed for 30 doses. Use as directed daily for 5 days every other week for 6 weeks.   clopidogrel 75 MG tablet Commonly known as: PLAVIX Take 37.5 mg by mouth 2 (two) times a week. Monday and Thursday   divalproex 500 MG 24 hr tablet Commonly known as: Depakote ER Take 1 tablet every night   docusate sodium 100 MG capsule Commonly known as: Colace Take 1 capsule (100 mg total) by mouth daily as needed.   doxycycline 100 MG capsule Commonly known as: Vibramycin Take 1 capsule (100 mg total) by mouth 2 (two) times daily. To be taken after surgery   famotidine 20 MG tablet Commonly known as: PEPCID Take 20 mg  by mouth 2 (two) times daily as needed for heartburn or indigestion.   LORazepam 0.5 MG tablet Commonly known as: ATIVAN Take 0.5 mg by mouth daily as needed for anxiety.   methocarbamol 750 MG tablet Commonly known as: Robaxin-750 Take 1 tablet (750 mg total) by mouth 2 (two) times daily as needed for muscle spasms.   mupirocin ointment  2 % Commonly known as: BACTROBAN Place 1 Application into the nose 2 (two) times daily for 60 doses. Use as directed 2 times daily for 5 days every other week for 6 weeks.   ondansetron 4 MG tablet Commonly known as: Zofran Take 1 tablet (4 mg total) by mouth every 8 (eight) hours as needed for nausea or vomiting.   oxyCODONE-acetaminophen 5-325 MG tablet Commonly known as: Percocet Take 1-2 tablets by mouth every 6 (six) hours as needed. To be taken after surgery   rosuvastatin 10 MG tablet Commonly known as: CRESTOR Take 1 tablet (10 mg total) by mouth at bedtime.   Vitamin D (Ergocalciferol) 1.25 MG (50000 UNIT) Caps capsule Commonly known as: DRISDOL Take 50,000 Units by mouth every Wednesday.        Diagnostic Studies: DG Pelvis Portable Result Date: 06/03/2023 CLINICAL DATA:  Postoperative exam, right hip. EXAM: PORTABLE PELVIS 1-2 VIEWS COMPARISON:  Pelvis and right hip radiographs 04/07/2023 FINDINGS: Interval total right hip arthroplasty. No perihardware lucency is seen to indicate hardware failure or loosening. Expected postoperative changes including lateral right hip subcutaneous air. The left femoroacetabular joint space is maintained. Mild superior pubic symphysis degenerative spurring. The bilateral sacroiliac joint spaces are maintained. IMPRESSION: Interval total right hip arthroplasty without evidence of hardware failure. Electronically Signed   By: Neita Garnet M.D.   On: 06/03/2023 13:33   DG HIP UNILAT WITH PELVIS 1V RIGHT Result Date: 06/03/2023 CLINICAL DATA:  Total right hip arthroplasty elective surgery. Intraoperative  fluoroscopy. EXAM: DG HIP (WITH OR WITHOUT PELVIS) 1V RIGHT COMPARISON:  Pelvis and right hip radiographs 04/07/2023 FINDINGS: Images were performed intraoperatively without the presence of a radiologist. The patient is undergoing total right hip arthroplasty. No hardware complication is seen. Total fluoroscopy images: 4 Total fluoroscopy time: 29 seconds Total dose: Radiation Exposure Index (as provided by the fluoroscopic device): 3.03 mGy air Kerma Please see intraoperative findings for further detail. IMPRESSION: Intraoperative fluoroscopy for total right hip arthroplasty. Electronically Signed   By: Neita Garnet M.D.   On: 06/03/2023 13:19   DG C-Arm 1-60 Min-No Report Result Date: 06/03/2023 Fluoroscopy was utilized by the requesting physician.  No radiographic interpretation.   DG C-Arm 1-60 Min-No Report Result Date: 06/03/2023 Fluoroscopy was utilized by the requesting physician.  No radiographic interpretation.    Disposition: Discharge disposition: 01-Home or Self Care       Discharge Instructions     Call MD / Call 911   Complete by: As directed    If you experience chest pain or shortness of breath, CALL 911 and be transported to the hospital emergency room.  If you develope a fever above 101.5 F, pus (white drainage) or increased drainage or redness at the wound, or calf pain, call your surgeon's office.   Call MD / Call 911   Complete by: As directed    If you experience chest pain or shortness of breath, CALL 911 and be transported to the hospital emergency room.  If you develope a fever above 101 F, pus (white drainage) or increased drainage or redness at the wound, or calf pain, call your surgeon's office.   Constipation Prevention   Complete by: As directed    Drink plenty of fluids.  Prune juice may be helpful.  You may use a stool softener, such as Colace (over the counter) 100 mg twice a day.  Use MiraLax (over the counter) for constipation as needed.   Constipation  Prevention   Complete by:  As directed    Drink plenty of fluids.  Prune juice may be helpful.  You may use a stool softener, such as Colace (over the counter) 100 mg twice a day.  Use MiraLax (over the counter) for constipation as needed.   Diet - low sodium heart healthy   Complete by: As directed    Driving restrictions   Complete by: As directed    No driving while taking narcotic pain meds.   Increase activity slowly as tolerated   Complete by: As directed    Increase activity slowly as tolerated   Complete by: As directed    Post-operative opioid taper instructions:   Complete by: As directed    POST-OPERATIVE OPIOID TAPER INSTRUCTIONS: It is important to wean off of your opioid medication as soon as possible. If you do not need pain medication after your surgery it is ok to stop day one. Opioids include: Codeine, Hydrocodone(Norco, Vicodin), Oxycodone(Percocet, oxycontin) and hydromorphone amongst others.  Long term and even short term use of opiods can cause: Increased pain response Dependence Constipation Depression Respiratory depression And more.  Withdrawal symptoms can include Flu like symptoms Nausea, vomiting And more Techniques to manage these symptoms Hydrate well Eat regular healthy meals Stay active Use relaxation techniques(deep breathing, meditating, yoga) Do Not substitute Alcohol to help with tapering If you have been on opioids for less than two weeks and do not have pain than it is ok to stop all together.  Plan to wean off of opioids This plan should start within one week post op of your joint replacement. Maintain the same interval or time between taking each dose and first decrease the dose.  Cut the total daily intake of opioids by one tablet each day Next start to increase the time between doses. The last dose that should be eliminated is the evening dose.      Post-operative opioid taper instructions:   Complete by: As directed     POST-OPERATIVE OPIOID TAPER INSTRUCTIONS: It is important to wean off of your opioid medication as soon as possible. If you do not need pain medication after your surgery it is ok to stop day one. Opioids include: Codeine, Hydrocodone(Norco, Vicodin), Oxycodone(Percocet, oxycontin) and hydromorphone amongst others.  Long term and even short term use of opiods can cause: Increased pain response Dependence Constipation Depression Respiratory depression And more.  Withdrawal symptoms can include Flu like symptoms Nausea, vomiting And more Techniques to manage these symptoms Hydrate well Eat regular healthy meals Stay active Use relaxation techniques(deep breathing, meditating, yoga) Do Not substitute Alcohol to help with tapering If you have been on opioids for less than two weeks and do not have pain than it is ok to stop all together.  Plan to wean off of opioids This plan should start within one week post op of your joint replacement. Maintain the same interval or time between taking each dose and first decrease the dose.  Cut the total daily intake of opioids by one tablet each day Next start to increase the time between doses. The last dose that should be eliminated is the evening dose.           Follow-up Information     Cristie Hem, PA-C. Schedule an appointment as soon as possible for a visit in 2 week(s).   Specialty: Orthopedic Surgery Contact information: [address] [place] [address] [phone]         Buckner Malta, MD Follow up.   Specialty: Family  Medicine Contact information: [address] [place] [address] [phone]         [facility] Follow up.   Specialty: Home Health Services Why: home health sercvices will be provided by [facility], start of care within 48 hours post discharge Contact information: [address] [place] [address] [phone]                   Signed: Glee Arvin 06/09/2023, 7:22 PM

## 2023-06-04 NOTE — Progress Notes (Signed)
Physical Therapy Contact note: Pt seen for PT tx, full note pending. Pt limited due to symptomatic orthostatic hypotension upon sitting/standing including pale pallor, c/o feeling hot, BLE weakness and fatigue, unable to safely progress ambulation other than pivotal transfers to BSC/recliner. Pt also reports RLE pain 9/10 after premedication in AM. Of note, pt and spouse report she has not had a BM in ~1 week, RN notified. Pt encouraged to hydrate and eat lunch once up in chair, PTA will return for second session later in the day.

## 2023-06-04 NOTE — Plan of Care (Signed)
  Problem: Education: Goal: Knowledge of General Education information will improve Description: Including pain rating scale, medication(s)/side effects and non-pharmacologic comfort measures Outcome: Adequate for Discharge   Problem: Health Behavior/Discharge Planning: Goal: Ability to manage health-related needs will improve Outcome: Adequate for Discharge   Problem: Clinical Measurements: Goal: Ability to maintain clinical measurements within normal limits will improve Outcome: Adequate for Discharge Goal: Will remain free from infection Outcome: Adequate for Discharge Goal: Diagnostic test results will improve Outcome: Adequate for Discharge Goal: Respiratory complications will improve Outcome: Adequate for Discharge Goal: Cardiovascular complication will be avoided Outcome: Adequate for Discharge   Problem: Activity: Goal: Risk for activity intolerance will decrease Outcome: Adequate for Discharge   Problem: Nutrition: Goal: Adequate nutrition will be maintained Outcome: Adequate for Discharge   Problem: Coping: Goal: Level of anxiety will decrease Outcome: Adequate for Discharge   Problem: Elimination: Goal: Will not experience complications related to bowel motility Outcome: Adequate for Discharge Goal: Will not experience complications related to urinary retention Outcome: Adequate for Discharge   Problem: Pain Managment: Goal: General experience of comfort will improve and/or be controlled Outcome: Adequate for Discharge   Problem: Safety: Goal: Ability to remain free from injury will improve Outcome: Adequate for Discharge   Problem: Skin Integrity: Goal: Risk for impaired skin integrity will decrease Outcome: Adequate for Discharge   Problem: Education: Goal: Knowledge of the prescribed therapeutic regimen will improve Outcome: Adequate for Discharge Goal: Understanding of discharge needs will improve Outcome: Adequate for Discharge Goal:  Individualized Educational Video(s) Outcome: Adequate for Discharge   Problem: Activity: Goal: Ability to avoid complications of mobility impairment will improve Outcome: Adequate for Discharge Goal: Ability to tolerate increased activity will improve Outcome: Adequate for Discharge   Problem: Clinical Measurements: Goal: Postoperative complications will be avoided or minimized Outcome: Adequate for Discharge   Problem: Pain Management: Goal: Pain level will decrease with appropriate interventions Outcome: Adequate for Discharge   Problem: Skin Integrity: Goal: Will show signs of wound healing Outcome: Adequate for Discharge   Problem: Acute Rehab PT Goals(only PT should resolve) Goal: Pt Will Go Supine/Side To Sit Outcome: Adequate for Discharge Goal: Patient Will Transfer Sit To/From Stand Outcome: Adequate for Discharge Goal: Pt Will Ambulate Outcome: Adequate for Discharge Goal: Pt Will Go Up/Down Stairs Outcome: Adequate for Discharge Goal: Pt/caregiver will Perform Home Exercise Program Outcome: Adequate for Discharge

## 2023-06-04 NOTE — Progress Notes (Addendum)
Physical Therapy Treatment Patient Details Name: Lindsay Erickson MRN: [id number] DOB: 17-Mar-1961 Today's Date: 06/04/2023   History of Present Illness Patient is 63 y.o. female s/p Rt THA anterior approach on 06/01/23. Pt experienced symptomatic orthostatic hypotension 1/29 and 1/30 during PT sessions. PMH significant for anemia, anxiety/depresssion, OA, benign tremor, DM, HLD, PVD, seizures, diplopia, brain aneurysm in 2022.    PT Comments  Pt received in supine, reporting feeling better after MD ordered a bolus for her and she had enema and BM, pt agreeable to therapy session with plan to work on gait progression. Once standing, pt with urinary urgency and used BSC but reports she had to cough to try to get urine out, RN/PA notified. Pt BP improved but she remains mildly orthostatic with standing at 2 and 5 min readings, with c/o subjective BLE weakness and mild dizziness. Pt limited to ~11ft gait with RW and minA prior to c/o increased RLE severe pain and needing seated break. Pt not yet able to perform household distance gait trial or stair negotiation and will need to be able to walk ~54ft to get from parking area into home, will need additional PT session(s) following date to progress functional mobility. Pt continues to benefit from PT services to progress toward functional mobility goals, disposition pending pt progress in next 1-2 sessions, pt and spouse hopeful for DC home with assist from her sister and her spouse.   Orthostatic Lying   BP- Lying 120/57  Pulse- Lying 81  Orthostatic Sitting  BP- Sitting 116/62  Pulse- Sitting 94  Orthostatic Standing at 0 minutes  BP- Standing at 0 minutes 98/59  Pulse- Standing at 0 minutes 124  Orthostatic Standing at 3 minutes  BP- Standing at 3 minutes 92/55  Pulse- Standing at 3 minutes  (subject c/o BLE weakness/fatigue)     If plan is discharge home, recommend the following: A lot of help with walking and/or transfers;A lot of help with  bathing/dressing/bathroom;Assistance with cooking/housework;Assistance with feeding;Assist for transportation;Help with stairs or ramp for entrance;Direct supervision/assist for medications management   Can travel by private vehicle        Equipment Recommendations  None recommended by PT (BSC already delivered to her room)    Recommendations for Other Services       Precautions / Restrictions Precautions Precautions: Fall Precaution Comments: denies falls in last 6 months Restrictions Weight Bearing Restrictions Per Provider Order: No     Mobility  Bed Mobility Overal bed mobility: Needs Assistance Bed Mobility: Sidelying to Sit, Rolling Rolling: Contact guard assist, Used rails Sidelying to sit: Mod assist, Used rails (from flat bed) Supine to sit: Mod assist, +2 for safety/equipment     General bed mobility comments: to L EOB from flat bed, use of rails (pt does not have rails at home), heavy modA to elevate trunk but pt moving her legs well to EOB without BLE assist. BP stable sitting EOB    Transfers Overall transfer level: Needs assistance Equipment used: Rolling walker (2 wheels) Transfers: Sit to/from Stand, Bed to chair/wheelchair/BSC Sit to Stand: Min assist, From elevated surface   Step pivot transfers: From elevated surface, Min assist       General transfer comment: from EOB>RW and higher BSC height<>RW and to recliner chair. Needs cues for sequencing and BUE placement. Pt urgent to pivot to [facility] after standing due to sensation of needing to urinate, pt still reports she has to cough to get urine out, RN/ortho PA notified.  Ambulation/Gait Ambulation/Gait assistance: Min assist, +2 safety/equipment Gait Distance (Feet): 6 Feet Assistive device: Rolling walker (2 wheels) Gait Pattern/deviations: Step-to pattern, Decreased step length - right, Decreased step length - left, Decreased dorsiflexion - right, Shuffle, Decreased weight shift to right, Narrow base  of support       General Gait Details: downward gaze, mod cues for upright posture, step sequencing while managing RW, pt needing minA for stability via gait belt and RW assist. Pt reports pain is more severe with standing exercises/ambulation and unable to perform gait distance to get to bathroom or hallway due to pain/fatigue. Pt agreeable to sit up in chair after using BSC and walking ~46ft to chair. HR ~120 bpm and SpO2 WFL on RA. Tremors increase when pt letting go of RW to gesture when talking, causing posteiror imbalance. Pt encouraged not to let go of RW, spouse able to maintain hold of gait belt when assisting her.   Stairs Stairs:  (Not yet safe to attempt)           Wheelchair Mobility     Tilt Bed    Modified Rankin (Stroke Patients Only)       Balance Overall balance assessment: Needs assistance Sitting-balance support: Bilateral upper extremity supported, Feet supported Sitting balance-Leahy Scale: Good Sitting balance - Comments: posterior LOB when unsupported due to guarding/pain   Standing balance support: Bilateral upper extremity supported, Reliant on assistive device for balance Standing balance-Leahy Scale: Poor Standing balance comment: pt letting go of RW on multiple occasions, need constant external support with gait belt                            Cognition Arousal: Alert Behavior During Therapy: WFL for tasks assessed/performed, Anxious Overall Cognitive Status: Within Functional Limits for tasks assessed                                 General Comments: Pt with improved carryover of some cues but still unsafe standing, tending to let go of RW when talking and seems somewhat unaware of posterior instability. Spouse present throughout and supportive.        Exercises Total Joint Exercises Ankle Circles/Pumps: AROM, Both, 20 reps (10reps x2 sets) Quad Sets: AROM, Right, 10 reps Heel Slides: AAROM, Right, 10 reps,  Supine Hip ABduction/ADduction: AROM, Right, 5 reps, Standing (small movements while standing in RW) Long Arc Quad: AROM, Right, 10 reps, Seated Marching in Standing: AROM, Right, 10 reps, Standing (a few reps on LLE per patient request)    General Comments General comments (skin integrity, edema, etc.): R hip dressing appears c/d/i; pt c/o subjective anterior R thigh numbness possibly due to use of iceman/cold      Pertinent Vitals/Pain Pain Assessment Pain Assessment: 0-10 Pain Score: 9  Pain Location: Rt hip anterior and lateral aspects Pain Descriptors / Indicators: Aching, Discomfort, Numbness, Grimacing, Operative site guarding Pain Intervention(s): Limited activity within patient's tolerance, Monitored during session, Premedicated before session, Repositioned, Ice applied    Home Living                          Prior Function            PT Goals (current goals can now be found in the care plan section) Acute Rehab PT Goals Patient Stated Goal: recover mobility and hip to hurt  less PT Goal Formulation: With patient/family Time For Goal Achievement: 06/10/23 Progress towards PT goals: Progressing toward goals    Frequency    Min 1X/week      PT Plan      Co-evaluation              AM-PAC PT "6 Clicks" Mobility   Outcome Measure  Help needed turning from your back to your side while in a flat bed without using bedrails?: A Little Help needed moving from lying on your back to sitting on the side of a flat bed without using bedrails?: A Lot Help needed moving to and from a bed to a chair (including a wheelchair)?: A Little Help needed standing up from a chair using your arms (e.g., wheelchair or bedside chair)?: A Little Help needed to walk in hospital room?: Total Help needed climbing 3-5 steps with a railing? : Total 6 Click Score: 13    End of Session Equipment Utilized During Treatment: Gait belt Activity Tolerance: Patient limited by  fatigue;Patient limited by pain;Treatment limited secondary to medical complications (Comment);Other (comment) (mild sx orthostatic hypotension, pt needs to cough to urinate) Patient left: with call bell/phone within reach;with family/visitor present;in chair;with chair alarm set;Other (comment) (encouraged to drink water and use iceman as needed) Nurse Communication: Mobility status;Patient requests pain meds;Other (comment) (orthostatic hypotension) PT Visit Diagnosis: Other abnormalities of gait and mobility (R26.89);Muscle weakness (generalized) (M62.81);Difficulty in walking, not elsewhere classified (R26.2);Unsteadiness on feet (R26.81);Pain Pain - Right/Left: Right Pain - part of body: Hip     Time: 8469-6295 PT Time Calculation (min) (ACUTE ONLY): 38 min  Charges:    $Gait Training: 8-22 mins $Therapeutic Exercise: 8-22 mins $Therapeutic Activity: 8-22 mins PT General Charges $$ ACUTE PT VISIT: 1 Visit                     Hawthorne Day P., PTA Acute Rehabilitation Services Secure Chat Preferred 9a-5:30pm Office: [phone]    [place]us [place] 06/04/2023, 4:35 PM

## 2023-06-04 NOTE — Progress Notes (Signed)
   Subjective:  Patient reports pain as moderate.  No events. A little dizzy with PT yesterday.  Objective:   VITALS:   Vitals:   06/03/23 1330 06/03/23 1417 06/03/23 2000 06/04/23 0600  BP: 106/88 111/77 97/75 (!) 91/53  Pulse: 96 100 82 78  Resp: 18 16 16 16   Temp: 97.6 F (36.4 C) 97.9 F (36.6 C) 98.7 F (37.1 C) 99 F (37.2 C)  TempSrc:  Oral Oral Oral  SpO2: 96% 98% 97% 97%  Weight:      Height:        Sensation intact distally Intact pulses distally Dorsiflexion/Plantar flexion intact Incision: dressing C/D/I and no drainage   Lab Results  Component Value Date   WBC 8.0 05/29/2023   HGB 12.4 05/29/2023   HCT 38.0 05/29/2023   MCV 96.0 05/29/2023   PLT 282 05/29/2023     Assessment/Plan:  1 Day Post-Op   - Up with PT/OT - DVT ppx - SCDs, ambulation, aspirin and plavix - WBAT operative extremity - Pain control - Discharge pending PT  Lindsay Erickson 06/04/2023, 7:21 AM

## 2023-06-05 DIAGNOSIS — M87051 Idiopathic aseptic necrosis of right femur: Secondary | ICD-10-CM | POA: Diagnosis present

## 2023-06-05 DIAGNOSIS — M1611 Unilateral primary osteoarthritis, right hip: Secondary | ICD-10-CM | POA: Diagnosis present

## 2023-06-05 DIAGNOSIS — F419 Anxiety disorder, unspecified: Secondary | ICD-10-CM | POA: Diagnosis present

## 2023-06-05 DIAGNOSIS — Z7983 Long term (current) use of bisphosphonates: Secondary | ICD-10-CM | POA: Diagnosis not present

## 2023-06-05 DIAGNOSIS — Z96641 Presence of right artificial hip joint: Secondary | ICD-10-CM | POA: Diagnosis present

## 2023-06-05 DIAGNOSIS — E782 Mixed hyperlipidemia: Secondary | ICD-10-CM | POA: Diagnosis present

## 2023-06-05 DIAGNOSIS — Z823 Family history of stroke: Secondary | ICD-10-CM | POA: Diagnosis not present

## 2023-06-05 DIAGNOSIS — K219 Gastro-esophageal reflux disease without esophagitis: Secondary | ICD-10-CM | POA: Diagnosis present

## 2023-06-05 DIAGNOSIS — E1151 Type 2 diabetes mellitus with diabetic peripheral angiopathy without gangrene: Secondary | ICD-10-CM | POA: Diagnosis present

## 2023-06-05 DIAGNOSIS — Z91040 Latex allergy status: Secondary | ICD-10-CM | POA: Diagnosis not present

## 2023-06-05 DIAGNOSIS — G250 Essential tremor: Secondary | ICD-10-CM | POA: Diagnosis present

## 2023-06-05 DIAGNOSIS — Z888 Allergy status to other drugs, medicaments and biological substances status: Secondary | ICD-10-CM | POA: Diagnosis not present

## 2023-06-05 DIAGNOSIS — Z885 Allergy status to narcotic agent status: Secondary | ICD-10-CM | POA: Diagnosis not present

## 2023-06-05 DIAGNOSIS — M25451 Effusion, right hip: Secondary | ICD-10-CM | POA: Diagnosis present

## 2023-06-05 DIAGNOSIS — Z9109 Other allergy status, other than to drugs and biological substances: Secondary | ICD-10-CM | POA: Diagnosis not present

## 2023-06-05 DIAGNOSIS — I959 Hypotension, unspecified: Secondary | ICD-10-CM | POA: Diagnosis not present

## 2023-06-05 DIAGNOSIS — R339 Retention of urine, unspecified: Secondary | ICD-10-CM | POA: Diagnosis not present

## 2023-06-05 DIAGNOSIS — Z8249 Family history of ischemic heart disease and other diseases of the circulatory system: Secondary | ICD-10-CM | POA: Diagnosis not present

## 2023-06-05 DIAGNOSIS — F17210 Nicotine dependence, cigarettes, uncomplicated: Secondary | ICD-10-CM | POA: Diagnosis present

## 2023-06-05 DIAGNOSIS — J4489 Other specified chronic obstructive pulmonary disease: Secondary | ICD-10-CM | POA: Diagnosis present

## 2023-06-05 DIAGNOSIS — Z87442 Personal history of urinary calculi: Secondary | ICD-10-CM | POA: Diagnosis not present

## 2023-06-05 DIAGNOSIS — Z833 Family history of diabetes mellitus: Secondary | ICD-10-CM | POA: Diagnosis not present

## 2023-06-05 DIAGNOSIS — Z90710 Acquired absence of both cervix and uterus: Secondary | ICD-10-CM | POA: Diagnosis not present

## 2023-06-05 DIAGNOSIS — Z7982 Long term (current) use of aspirin: Secondary | ICD-10-CM | POA: Diagnosis not present

## 2023-06-05 DIAGNOSIS — G40109 Localization-related (focal) (partial) symptomatic epilepsy and epileptic syndromes with simple partial seizures, not intractable, without status epilepticus: Secondary | ICD-10-CM | POA: Diagnosis present

## 2023-06-05 HISTORY — DX: Unilateral primary osteoarthritis, right hip: M16.11

## 2023-06-05 LAB — HEMOGLOBIN AND HEMATOCRIT, BLOOD
HCT: 24.1 % — ABNORMAL LOW (ref 36.0–46.0)
Hemoglobin: 8.6 g/dL — ABNORMAL LOW (ref 12.0–15.0)

## 2023-06-05 LAB — PREPARE RBC (CROSSMATCH)

## 2023-06-05 MED ORDER — SODIUM CHLORIDE 0.9 % IV BOLUS
1000.0000 mL | Freq: Once | INTRAVENOUS | Status: AC
  Administered 2023-06-05: 1000 mL via INTRAVENOUS

## 2023-06-05 MED ORDER — SODIUM CHLORIDE 0.9% IV SOLUTION: Freq: Once | INTRAVENOUS | Status: AC

## 2023-06-05 NOTE — Progress Notes (Addendum)
Physical Therapy Treatment Patient Details Name: Lindsay Erickson MRN: [id number] DOB: 30-Sep-1960 Today's Date: 06/05/2023   History of Present Illness Patient is 63 y.o. female s/p Rt THA anterior approach on 06/01/23. Pt experienced symptomatic orthostatic hypotension 1/29, 1/30, 1/31 during PT sessions. PMH significant for anemia, anxiety/depresssion, OA, benign tremor, DM, HLD, PVD, seizures, diplopia, brain aneurysm in 2022.    PT Comments  Pt received in supine, pleasantly agreeable to therapy session, with good participation and improved tolerance for standing activity, including gait and stair negotiation. Pt with mild symptoms toward end of gait trial including BLE fatigue and throbbing headache sensation, so she took a seated break in chair and BP found to be very low (see below), RN/PA notified. Pt also reports some feeling of bladder fullness, RN notified. Pt making excellent progress toward goals, needing up to minA for transfers/gait/stair training and up to [facility] for bed mobility from flat bed, which she reports she typically also needs help at home for bed mobility. Anticipate once pt is medically cleared she will be able to manage from a functional standpoint for DC home with HHPT or as surgeon recommends, but will continue to follow her acutely to ensure pt maintains her functional progress given her BP instability. Orthostatic Sitting  BP- Sitting 108/54  Pulse- Sitting 96  Orthostatic Standing at 0 minutes  BP- Standing at 0 minutes 90/54 (asymptomatic)  Pulse- Standing at 0 minutes 118    BP -at 11:38 (!) 69/58 (sitting in recliner head up after stair and gait training with PTA)  BP Location Left Arm  BP Method Automatic  Patient Position (if appropriate) Sitting (c/o throbbing headache, fatigue)   BP -at 11:42 (!) 81/43  BP Location Left Arm  BP Method Automatic  Patient Position (if appropriate) Lying (fully supine in recliner, symptoms resolved)   BP - at 11:47 (!)  94/57  BP Location Left Arm  BP Method Automatic  Patient Position (if appropriate) Lying (fully supine still in recliner)     If plan is discharge home, recommend the following: A lot of help with walking and/or transfers;A lot of help with bathing/dressing/bathroom;Assistance with cooking/housework;Assistance with feeding;Assist for transportation;Help with stairs or ramp for entrance;Direct supervision/assist for medications management   Can travel by private vehicle        Equipment Recommendations  None recommended by PT (BSC already delivered to her room)    Recommendations for Other Services       Precautions / Restrictions Precautions Precautions: Fall Precaution Comments: denies falls in last 6 months Restrictions Weight Bearing Restrictions Per Provider Order: No     Mobility  Bed Mobility Overal bed mobility: Needs Assistance Bed Mobility: Sidelying to Sit, Rolling Rolling: Contact guard assist Sidelying to sit: Mod assist (from flat bed)       General bed mobility comments: to L EOB from flat bed, cues not to use rails, modA to elevate trunk but pt moving her legs well to EOB without BLE assist with increased time/effort. BP stable sitting EOB.    Transfers Overall transfer level: Needs assistance Equipment used: Rolling walker (2 wheels) Transfers: Sit to/from Stand, Bed to chair/wheelchair/BSC Sit to Stand: Min assist, Contact guard assist   Step pivot transfers: From elevated surface, Min assist       General transfer comment: EOB>BSC and BSC>chair with RW support    Ambulation/Gait Ambulation/Gait assistance: Min assist, +2 safety/equipment Gait Distance (Feet): 100 Feet (62ft, then 153ft with chair follow) Assistive device: Rolling walker (2  wheels) Gait Pattern/deviations: Step-to pattern, Decreased step length - right, Decreased step length - left, Decreased dorsiflexion - right, Decreased weight shift to right Gait velocity: decreased      General Gait Details: Cues for step sequencing while managing RW, pt needing up to minA for stability via gait belt and less RW assist this date, some L veering pt needs cues to walk more toward center of hallway. Pt reports some headache after longer distance so returned to recliner and BP check, found to be profoundly low- BP 69/58 (64) HR 100 bpm, RN notified immediately and pt reclined further, RN calling ortho PA.   Stairs Stairs: Yes Stairs assistance: Min assist Stair Management: Two rails, Step to pattern, Forwards Number of Stairs: 4 (2+2 steps in PT gym) General stair comments: cues for sequencing/safety, pt c/o fatigue at end of trial but able to continue ambulation in hallway, spouse assist via gait belt throughout no buckling or significant LOB on stairs, no c/o dizziness or nausea.   Wheelchair Mobility     Tilt Bed    Modified Rankin (Stroke Patients Only)       Balance Overall balance assessment: Needs assistance Sitting-balance support: Bilateral upper extremity supported, Feet supported Sitting balance-Leahy Scale: Good     Standing balance support: Bilateral upper extremity supported, Reliant on assistive device for balance Standing balance-Leahy Scale: Poor Standing balance comment: pt letting go of RW on multiple occasions, need constant external support with gait belt for safety, but no overt buckling, some posterior instability initially upon standing and when performing peri-care after toileting.                            Cognition Arousal: Alert Behavior During Therapy: WFL for tasks assessed/performed, Anxious Overall Cognitive Status: Within Functional Limits for tasks assessed                                 General Comments: Pt with improved carryover of some cues but needs reminders for safe UE placement with each transfer. Spouse present throughout and supportive, able to assist with gait belt.        Exercises  Total Joint Exercises Ankle Circles/Pumps: AROM, Both, 20 reps, Supine (x10 before/after OOB) Short Arc Quad: AROM, Right, 5 reps, Supine Heel Slides: AAROM, Right, 10 reps, Supine, AROM (AA for improved ROM) Hip ABduction/ADduction: Right, 5 reps, AAROM, Supine    General Comments        Pertinent Vitals/Pain Pain Assessment Pain Assessment: Faces Faces Pain Scale: Hurts even more Pain Location: Rt hip anterior and lateral aspects and dull headache Pain Descriptors / Indicators: Aching, Discomfort, Numbness, Grimacing, Operative site guarding, Headache Pain Intervention(s): Monitored during session, Premedicated before session, Repositioned, Ice applied    Home Living                          Prior Function            PT Goals (current goals can now be found in the care plan section) Acute Rehab PT Goals Patient Stated Goal: recover mobility and hip to hurt less PT Goal Formulation: With patient/family Time For Goal Achievement: 06/10/23 Progress towards PT goals: Progressing toward goals    Frequency    Min 1X/week      PT Plan      Co-evaluation  AM-PAC PT "6 Clicks" Mobility   Outcome Measure  Help needed turning from your back to your side while in a flat bed without using bedrails?: A Little Help needed moving from lying on your back to sitting on the side of a flat bed without using bedrails?: A Lot Help needed moving to and from a bed to a chair (including a wheelchair)?: A Little Help needed standing up from a chair using your arms (e.g., wheelchair or bedside chair)?: A Little Help needed to walk in hospital room?: A Little (chair follow but did not need to sit) Help needed climbing 3-5 steps with a railing? : A Little 6 Click Score: 17    End of Session Equipment Utilized During Treatment: Gait belt (bil knee high TED hose in place) Activity Tolerance: Treatment limited secondary to medical complications (Comment);Other  (comment);Patient tolerated treatment well (mild sx orthostatic hypotension, pt needs to cough to urinate sometimes, very low BP after activity) Patient left: with call bell/phone within reach;with family/visitor present;in chair;with chair alarm set;Other (comment) (spouse in room, ice pack to her R hip) Nurse Communication: Mobility status;Other (comment) (very low BP after gait/stair training) PT Visit Diagnosis: Other abnormalities of gait and mobility (R26.89);Muscle weakness (generalized) (M62.81);Difficulty in walking, not elsewhere classified (R26.2);Unsteadiness on feet (R26.81);Pain Pain - Right/Left: Right Pain - part of body: Hip     Time: 1100-1150 PT Time Calculation (min) (ACUTE ONLY): 50 min  Charges:    $Gait Training: 23-37 mins $Therapeutic Exercise: 8-22 mins PT General Charges $$ ACUTE PT VISIT: 1 Visit                     Tailyn Hantz P., PTA Acute Rehabilitation Services Secure Chat Preferred 9a-5:30pm Office: [phone]    Dorathy Kinsman [facility] 06/05/2023, 12:08 PM

## 2023-06-05 NOTE — Progress Notes (Addendum)
Subjective: 2 Days Post-Op Procedure(s) (LRB): RIGHT TOTAL HIP ARTHROPLASTY ANTERIOR APPROACH (Right) Patient reports pain as mild.  Feeling fine this am.  No complaints of lightheadedness/dizziness. Patient has a purewick this am and is able to urinate without apparent issues.  Had a bm yesterday after fleets enema  Objective: Vital signs in last 24 hours: Temp:  [97.6 F (36.4 C)-98.9 F (37.2 C)] 98.3 F (36.8 C) (01/31 0500) Pulse Rate:  [70-88] 70 (01/31 0500) Resp:  [16-18] 16 (01/31 0500) BP: (89-120)/(45-62) 89/48 (01/31 0500) SpO2:  [98 %-100 %] 100 % (01/31 0500)  Intake/Output from previous day: 01/30 0701 - 01/31 0700 In: 240 [P.O.:240] Out: 1000 [Urine:1000] Intake/Output this shift: No intake/output data recorded.  No results for input(s): "HGB" in the last 72 hours. No results for input(s): "WBC", "RBC", "HCT", "PLT" in the last 72 hours. No results for input(s): "NA", "K", "CL", "CO2", "BUN", "CREATININE", "GLUCOSE", "CALCIUM" in the last 72 hours. No results for input(s): "LABPT", "INR" in the last 72 hours.  Neurologically intact Neurovascular intact Sensation intact distally Intact pulses distally Dorsiflexion/Plantar flexion intact Incision: dressing C/D/I No cellulitis present Compartment soft   Assessment/Plan: 2 Days Post-Op Procedure(s) (LRB): RIGHT TOTAL HIP ARTHROPLASTY ANTERIOR APPROACH (Right) Advance diet Up with therapy D/C IV fluids Discharge home with home health once BP stabilizes and patient is cleared by PT Hypotension- fluid bolus ordered.  D/c oxycontin.  Will also order hemoglobin/hematocrit Acute urinary retention- seems to have resolved with flomax and having had a large bm yesterday.  Currently in bed with a purewick and able to urinate      Cristie Hem 06/05/2023, 7:42 AM

## 2023-06-06 LAB — TYPE AND SCREEN
ABO/RH(D): A NEG
Antibody Screen: NEGATIVE
Unit division: 0

## 2023-06-06 LAB — BPAM RBC
Blood Product Expiration Date: 202502182359
ISSUE DATE / TIME: 202501311441
Unit Type and Rh: 600

## 2023-06-06 NOTE — TOC Transition Note (Signed)
Transition of Care [facility]) - Discharge Note   Patient Details  Name: Lindsay Erickson MRN: [id number] Date of Birth: [birth year]  Transition of Care [facility]) CM/SW Contact:  Ronny Bacon, RN Phone Number: 06/06/2023, 11:03 AM   Clinical Narrative:   Patient is being discharged today, Glenda with [facility] made aware.    Final next level of care: Home w Home Health Services Barriers to Discharge: No Barriers Identified   Patient Goals and CMS Choice            Discharge Placement                       Discharge Plan and Services Additional resources added to the After Visit Summary for                                       Social Drivers of Health (SDOH) Interventions SDOH Screenings   Food Insecurity: No Food Insecurity (06/05/2023)  Housing: Unknown (06/03/2023)  Transportation Needs: Unknown (06/03/2023)  Utilities: Not At Risk (06/03/2023)  Depression (PHQ2-9): Low Risk  (07/09/2018)  Tobacco Use: High Risk (06/03/2023)     Readmission Risk Interventions     No data to display

## 2023-06-06 NOTE — Progress Notes (Signed)
  Subjective:   Patient stable.  Did do physical therapy this morning.  Was able to get around without any dizziness or lightheadedness.  Blood pressure measurements taken and show mild hypotension but is not really symptomatic.  She did have 1 unit packed red blood cell transfusion yesterday.  Has husband with her who is able and willing to assist her with mobilization. Objective: Vital signs in last 24 hours: Temp:  [98.2 F (36.8 C)-98.9 F (37.2 C)] 98.4 F (36.9 C) (02/01 0735) Pulse Rate:  [75-83] 83 (02/01 0735) Resp:  [15-17] 15 (02/01 0531) BP: (69-108)/(43-77) 97/48 (02/01 0735) SpO2:  [98 %-100 %] 100 % (02/01 0735)  Intake/Output from previous day: 01/31 0701 - 02/01 0700 In: 835.8 [P.O.:540; Blood:295.8] Out: 2700 [Urine:2700] Intake/Output this shift: No intake/output data recorded.  Exam:  Sensation intact distally Intact pulses distally Dorsiflexion/Plantar flexion intact  Labs: Recent Labs    06/05/23 0755  HGB 8.6*   Recent Labs    06/05/23 0755  HCT 24.1*   No results for input(s): "NA", "K", "CL", "CO2", "BUN", "CREATININE", "GLUCOSE", "CALCIUM" in the last 72 hours. No results for input(s): "LABPT", "INR" in the last 72 hours.  Assessment/Plan: Plan at this time is discharge to home.  Patient is able to mobilize.  Her pain is controlled.  She has no clinical symptoms of hypotension.  All cultures negative to date.  Hemoglobin 8.6 yesterday prior to 1 unit packed red blood cell transfusion.   G Scott Alvon Nygaard 06/06/2023, 10:46 AM

## 2023-06-06 NOTE — Progress Notes (Signed)
Physical Therapy Treatment Patient Details Name: Lindsay Erickson MRN: [id number] DOB: 1961/02/10 Today's Date: 06/06/2023   History of Present Illness Patient is 63 y.o. female s/p Rt THA anterior approach on 06/01/23. Pt experienced symptomatic orthostatic hypotension 1/29, 1/30, 1/31 during PT sessions. PMH significant for anemia, anxiety/depresssion, OA, benign tremor, DM, HLD, PVD, seizures, diplopia, brain aneurysm in 2022.    PT Comments  Pt resting in bed on arrival and agreeable to session with steady progress towards acute goals. Pt requiring min A to come to sitting EOB with assist needed to manage RLE to and off EOB and elevate trunk. Pt with improved transfers this session, able to boost to stand with grossly CGA for safety, pt does continue to require cues for safe hand placement with each transfer. Pt demonstrating gait x3 bouts during session with grossly CGA for safety with RW for support. Pt with soft BP throughout session and with continued orthostatic hypotension initially, however improving with mobility and pt asymptomatic throughout (see below) RN notified and aware. Anticipate safe discharge, with assist level outlined below, once medically cleared, will continue to follow acutely to progress functional mobility as able given her BP instability.   BP readings: Supine: 94/58 Unable to obtain BP in sitting as pt with bathroom urgency Seated up in recliner after ambulating from bathroom: 74/58 Seated in recliner after second gait bout:  87/77    If plan is discharge home, recommend the following: A lot of help with walking and/or transfers;A lot of help with bathing/dressing/bathroom;Assistance with cooking/housework;Assistance with feeding;Assist for transportation;Help with stairs or ramp for entrance;Direct supervision/assist for medications management   Can travel by private vehicle        Equipment Recommendations  None recommended by PT (BSC already delivered to her  room)    Recommendations for Other Services       Precautions / Restrictions Precautions Precautions: Fall Precaution Comments: denies falls in last 6 months Restrictions Weight Bearing Restrictions Per Provider Order: No     Mobility  Bed Mobility Overal bed mobility: Needs Assistance Bed Mobility: Supine to Sit     Supine to sit: Min assist     General bed mobility comments: increased time needed and effortful, able to rise without rail use, min A to manage RLE to and off EOB and to elevate trunk    Transfers Overall transfer level: Needs assistance Equipment used: Rolling walker (2 wheels) Transfers: Sit to/from Stand, Bed to chair/wheelchair/BSC Sit to Stand: Min assist, Contact guard assist           General transfer comment: CGA from EOB and recliner, light min A from low commode    Ambulation/Gait Ambulation/Gait assistance: Contact guard assist Gait Distance (Feet): 15 Feet (+ 15' + 60') Assistive device: Rolling walker (2 wheels) Gait Pattern/deviations: Step-to pattern, Decreased step length - right, Decreased step length - left, Decreased dorsiflexion - right, Decreased weight shift to right Gait velocity: decreased     General Gait Details: no overt LOB noted, CGA for safety, distance limited by pain and to pt stated tolerance, no c/o dizziness this session   Stairs             Wheelchair Mobility     Tilt Bed    Modified Rankin (Stroke Patients Only)       Balance Overall balance assessment: Needs assistance Sitting-balance support: Bilateral upper extremity supported, Feet supported Sitting balance-Leahy Scale: Good     Standing balance support: Bilateral upper extremity supported, Reliant on  assistive device for balance Standing balance-Leahy Scale: Poor Standing balance comment: reliant on UE support of RW during dynamic tasks, pt letting go of RW with RUE to gesture when talking, no LOB this session                             Cognition Arousal: Alert Behavior During Therapy: [facility] for tasks assessed/performed, Anxious Overall Cognitive Status: Within Functional Limits for tasks assessed                                          Exercises      General Comments General comments (skin integrity, edema, etc.): BP supine 94/58, pt with bathroom urgency upon sitting (unable to obtain BP in sitting) BP in recliner after ambulating from bathroom 74/58, BP seated in recliner after second gait bout 87/77, ted hose donned throughout session and RN aware of BP readings      Pertinent Vitals/Pain Pain Assessment Pain Assessment: Faces Faces Pain Scale: Hurts even more Pain Location: Rt hip anterior and lateral aspects and dull headache Pain Descriptors / Indicators: Aching, Discomfort, Numbness, Grimacing, Operative site guarding, Headache Pain Intervention(s): Monitored during session, Limited activity within patient's tolerance, Repositioned, Ice applied    Home Living                          Prior Function            PT Goals (current goals can now be found in the care plan section) Acute Rehab PT Goals Patient Stated Goal: recover mobility and hip to hurt less PT Goal Formulation: With patient/family Time For Goal Achievement: 06/10/23 Progress towards PT goals: Progressing toward goals    Frequency    Min 1X/week      PT Plan      Co-evaluation              AM-PAC PT "6 Clicks" Mobility   Outcome Measure  Help needed turning from your back to your side while in a flat bed without using bedrails?: A Little Help needed moving from lying on your back to sitting on the side of a flat bed without using bedrails?: A Lot Help needed moving to and from a bed to a chair (including a wheelchair)?: A Little Help needed standing up from a chair using your arms (e.g., wheelchair or bedside chair)?: A Little Help needed to walk in hospital room?: A  Little Help needed climbing 3-5 steps with a railing? : A Little 6 Click Score: 17    End of Session Equipment Utilized During Treatment: Gait belt (bil knee high TED hose) Activity Tolerance: Patient tolerated treatment well;Patient limited by pain;Patient limited by fatigue Patient left: with call bell/phone within reach;in chair;with chair alarm set (ice pack to her R hip) Nurse Communication: Mobility status;Other (comment) (BP readings) PT Visit Diagnosis: Other abnormalities of gait and mobility (R26.89);Muscle weakness (generalized) (M62.81);Difficulty in walking, not elsewhere classified (R26.2);Unsteadiness on feet (R26.81);Pain Pain - Right/Left: Right Pain - part of body: Hip     Time: 0454-0981 PT Time Calculation (min) (ACUTE ONLY): 25 min  Charges:    $Gait Training: 8-22 mins $Therapeutic Activity: 8-22 mins PT General Charges $$ ACUTE PT VISIT: 1 Visit  Lenora Boys. PTA Acute Rehabilitation Services Office: [phone]   Catalina Antigua 06/06/2023, 9:51 AM

## 2023-06-06 NOTE — Progress Notes (Signed)
Pt discharged to home. DC instructions given with husband at bedside . No concerns verbalized. Both patient and spouse denied DME needs at this time. Pt left unit in wheelchair pushed by SWOT RN, Melanda. Left in stable condition.

## 2023-06-07 DIAGNOSIS — E782 Mixed hyperlipidemia: Secondary | ICD-10-CM | POA: Diagnosis not present

## 2023-06-07 DIAGNOSIS — F419 Anxiety disorder, unspecified: Secondary | ICD-10-CM | POA: Diagnosis not present

## 2023-06-07 DIAGNOSIS — G43909 Migraine, unspecified, not intractable, without status migrainosus: Secondary | ICD-10-CM | POA: Diagnosis not present

## 2023-06-07 DIAGNOSIS — Z96641 Presence of right artificial hip joint: Secondary | ICD-10-CM | POA: Diagnosis not present

## 2023-06-07 DIAGNOSIS — Z471 Aftercare following joint replacement surgery: Secondary | ICD-10-CM | POA: Diagnosis not present

## 2023-06-07 DIAGNOSIS — E1151 Type 2 diabetes mellitus with diabetic peripheral angiopathy without gangrene: Secondary | ICD-10-CM | POA: Diagnosis not present

## 2023-06-07 DIAGNOSIS — F32A Depression, unspecified: Secondary | ICD-10-CM | POA: Diagnosis not present

## 2023-06-07 DIAGNOSIS — G250 Essential tremor: Secondary | ICD-10-CM | POA: Diagnosis not present

## 2023-06-07 DIAGNOSIS — K219 Gastro-esophageal reflux disease without esophagitis: Secondary | ICD-10-CM | POA: Diagnosis not present

## 2023-06-08 LAB — AEROBIC/ANAEROBIC CULTURE W GRAM STAIN (SURGICAL/DEEP WOUND)
Culture: NO GROWTH
Culture: NO GROWTH

## 2023-06-09 DIAGNOSIS — E1151 Type 2 diabetes mellitus with diabetic peripheral angiopathy without gangrene: Secondary | ICD-10-CM | POA: Diagnosis not present

## 2023-06-09 DIAGNOSIS — F32A Depression, unspecified: Secondary | ICD-10-CM | POA: Diagnosis not present

## 2023-06-09 DIAGNOSIS — G250 Essential tremor: Secondary | ICD-10-CM | POA: Diagnosis not present

## 2023-06-09 DIAGNOSIS — F419 Anxiety disorder, unspecified: Secondary | ICD-10-CM | POA: Diagnosis not present

## 2023-06-09 DIAGNOSIS — K219 Gastro-esophageal reflux disease without esophagitis: Secondary | ICD-10-CM | POA: Diagnosis not present

## 2023-06-09 DIAGNOSIS — Z96641 Presence of right artificial hip joint: Secondary | ICD-10-CM | POA: Diagnosis not present

## 2023-06-09 DIAGNOSIS — E782 Mixed hyperlipidemia: Secondary | ICD-10-CM | POA: Diagnosis not present

## 2023-06-09 DIAGNOSIS — Z471 Aftercare following joint replacement surgery: Secondary | ICD-10-CM | POA: Diagnosis not present

## 2023-06-09 DIAGNOSIS — G43909 Migraine, unspecified, not intractable, without status migrainosus: Secondary | ICD-10-CM | POA: Diagnosis not present

## 2023-06-11 DIAGNOSIS — F419 Anxiety disorder, unspecified: Secondary | ICD-10-CM | POA: Diagnosis not present

## 2023-06-11 DIAGNOSIS — Z96641 Presence of right artificial hip joint: Secondary | ICD-10-CM | POA: Diagnosis not present

## 2023-06-11 DIAGNOSIS — G43909 Migraine, unspecified, not intractable, without status migrainosus: Secondary | ICD-10-CM | POA: Diagnosis not present

## 2023-06-11 DIAGNOSIS — K219 Gastro-esophageal reflux disease without esophagitis: Secondary | ICD-10-CM | POA: Diagnosis not present

## 2023-06-11 DIAGNOSIS — E1151 Type 2 diabetes mellitus with diabetic peripheral angiopathy without gangrene: Secondary | ICD-10-CM | POA: Diagnosis not present

## 2023-06-11 DIAGNOSIS — E782 Mixed hyperlipidemia: Secondary | ICD-10-CM | POA: Diagnosis not present

## 2023-06-11 DIAGNOSIS — F32A Depression, unspecified: Secondary | ICD-10-CM | POA: Diagnosis not present

## 2023-06-11 DIAGNOSIS — Z471 Aftercare following joint replacement surgery: Secondary | ICD-10-CM | POA: Diagnosis not present

## 2023-06-11 DIAGNOSIS — G250 Essential tremor: Secondary | ICD-10-CM | POA: Diagnosis not present

## 2023-06-15 DIAGNOSIS — K219 Gastro-esophageal reflux disease without esophagitis: Secondary | ICD-10-CM | POA: Diagnosis not present

## 2023-06-15 DIAGNOSIS — G43909 Migraine, unspecified, not intractable, without status migrainosus: Secondary | ICD-10-CM | POA: Diagnosis not present

## 2023-06-15 DIAGNOSIS — F419 Anxiety disorder, unspecified: Secondary | ICD-10-CM | POA: Diagnosis not present

## 2023-06-15 DIAGNOSIS — G250 Essential tremor: Secondary | ICD-10-CM | POA: Diagnosis not present

## 2023-06-15 DIAGNOSIS — E782 Mixed hyperlipidemia: Secondary | ICD-10-CM | POA: Diagnosis not present

## 2023-06-15 DIAGNOSIS — Z96641 Presence of right artificial hip joint: Secondary | ICD-10-CM | POA: Diagnosis not present

## 2023-06-15 DIAGNOSIS — F32A Depression, unspecified: Secondary | ICD-10-CM | POA: Diagnosis not present

## 2023-06-15 DIAGNOSIS — E1151 Type 2 diabetes mellitus with diabetic peripheral angiopathy without gangrene: Secondary | ICD-10-CM | POA: Diagnosis not present

## 2023-06-15 DIAGNOSIS — Z471 Aftercare following joint replacement surgery: Secondary | ICD-10-CM | POA: Diagnosis not present

## 2023-06-16 DIAGNOSIS — K219 Gastro-esophageal reflux disease without esophagitis: Secondary | ICD-10-CM | POA: Diagnosis not present

## 2023-06-16 DIAGNOSIS — E782 Mixed hyperlipidemia: Secondary | ICD-10-CM | POA: Diagnosis not present

## 2023-06-16 DIAGNOSIS — Z471 Aftercare following joint replacement surgery: Secondary | ICD-10-CM | POA: Diagnosis not present

## 2023-06-16 DIAGNOSIS — G43909 Migraine, unspecified, not intractable, without status migrainosus: Secondary | ICD-10-CM | POA: Diagnosis not present

## 2023-06-16 DIAGNOSIS — G250 Essential tremor: Secondary | ICD-10-CM | POA: Diagnosis not present

## 2023-06-16 DIAGNOSIS — E1151 Type 2 diabetes mellitus with diabetic peripheral angiopathy without gangrene: Secondary | ICD-10-CM | POA: Diagnosis not present

## 2023-06-16 DIAGNOSIS — F32A Depression, unspecified: Secondary | ICD-10-CM | POA: Diagnosis not present

## 2023-06-16 DIAGNOSIS — Z96641 Presence of right artificial hip joint: Secondary | ICD-10-CM | POA: Diagnosis not present

## 2023-06-16 DIAGNOSIS — F419 Anxiety disorder, unspecified: Secondary | ICD-10-CM | POA: Diagnosis not present

## 2023-06-17 DIAGNOSIS — Z1211 Encounter for screening for malignant neoplasm of colon: Secondary | ICD-10-CM | POA: Diagnosis not present

## 2023-06-17 DIAGNOSIS — D62 Acute posthemorrhagic anemia: Secondary | ICD-10-CM | POA: Diagnosis not present

## 2023-06-17 DIAGNOSIS — Z96641 Presence of right artificial hip joint: Secondary | ICD-10-CM | POA: Diagnosis not present

## 2023-06-17 DIAGNOSIS — K219 Gastro-esophageal reflux disease without esophagitis: Secondary | ICD-10-CM | POA: Diagnosis not present

## 2023-06-17 DIAGNOSIS — F419 Anxiety disorder, unspecified: Secondary | ICD-10-CM | POA: Diagnosis not present

## 2023-06-17 DIAGNOSIS — F32A Depression, unspecified: Secondary | ICD-10-CM | POA: Diagnosis not present

## 2023-06-17 DIAGNOSIS — Z471 Aftercare following joint replacement surgery: Secondary | ICD-10-CM | POA: Diagnosis not present

## 2023-06-17 DIAGNOSIS — Z6821 Body mass index (BMI) 21.0-21.9, adult: Secondary | ICD-10-CM | POA: Diagnosis not present

## 2023-06-17 DIAGNOSIS — G250 Essential tremor: Secondary | ICD-10-CM | POA: Diagnosis not present

## 2023-06-17 DIAGNOSIS — E1151 Type 2 diabetes mellitus with diabetic peripheral angiopathy without gangrene: Secondary | ICD-10-CM | POA: Diagnosis not present

## 2023-06-17 DIAGNOSIS — M7989 Other specified soft tissue disorders: Secondary | ICD-10-CM | POA: Diagnosis not present

## 2023-06-17 DIAGNOSIS — M79604 Pain in right leg: Secondary | ICD-10-CM | POA: Diagnosis not present

## 2023-06-17 DIAGNOSIS — G43909 Migraine, unspecified, not intractable, without status migrainosus: Secondary | ICD-10-CM | POA: Diagnosis not present

## 2023-06-17 DIAGNOSIS — E782 Mixed hyperlipidemia: Secondary | ICD-10-CM | POA: Diagnosis not present

## 2023-06-18 ENCOUNTER — Encounter: Payer: Self-pay | Admitting: Physician Assistant

## 2023-06-18 ENCOUNTER — Ambulatory Visit (INDEPENDENT_AMBULATORY_CARE_PROVIDER_SITE_OTHER): Payer: Medicare HMO | Admitting: Physician Assistant

## 2023-06-18 ENCOUNTER — Telehealth: Payer: Self-pay | Admitting: *Deleted

## 2023-06-18 DIAGNOSIS — Z96641 Presence of right artificial hip joint: Secondary | ICD-10-CM

## 2023-06-18 MED ORDER — OXYCODONE-ACETAMINOPHEN 5-325 MG PO TABS: 1.0000 | ORAL_TABLET | Freq: Three times a day (TID) | ORAL | 0 refills | Status: DC | PRN

## 2023-06-18 NOTE — Telephone Encounter (Signed)
Ok, thanks.

## 2023-06-18 NOTE — Telephone Encounter (Signed)
Patient called back after her visit today and wanted to see if she could get additional [facility] visits for therapy instead of OPPT. I told her I wasn't in that conversation, but I'd ask. Maybe a few more visits over the next several weeks (2), then transition if she needs more? I know it's harder for her to get out.

## 2023-06-18 NOTE — Progress Notes (Signed)
Post-Op Visit Note   Patient: Lindsay Erickson           Date of Birth: 23-May-1960           MRN: [id number] Visit Date: 06/18/2023 PCP: Buckner Malta, MD   Assessment & Plan:  Chief Complaint:  Chief Complaint  Patient presents with   Right Hip - Follow-up    Right total hip arthroplasty 06/03/2023   Visit Diagnoses:  1. Status post total replacement of right hip     Plan: Patient is a pleasant 63 year old female who comes in today 2 weeks status post right total hip replacement 06/03/2023.  She has been doing relatively well.  She is getting home health physical therapy and ambulating with a walker.  She has been taking Percocet Robaxin for pain.  She is on aspirin and Plavix and has been compliant with this.  She tells me her PCP sent her for a right lower extremity ultrasound yesterday.  She was not told of the results.  On my unable to find these in the chart today.  Examination of the right hip reveals a well-healing surgical incision with nylon sutures in place.  No evidence of infection or cellulitis.  Calves are soft and nontender.  She is neurovascularly intact distally.  Today, sutures were removed and Steri-Strips applied.  We have discussed outpatient referral for which I think she would benefit.  Referral has been made.  I refilled her Percocet.  Postoperative instructions provided.  In regards to her ultrasound results from yesterday, I am unable to find these in the chart.  Have discussed calling her PCP for these, but I did reassure her that usually if someone has a DVT they will make the patient aware prior to leaving the appointment.  Follow-up in 4 weeks for repeat evaluation and AP pelvis exercises.  Follow-Up Instructions: Return in about 4 weeks (around 07/16/2023).   Orders:  Orders Placed This Encounter  Procedures   Ambulatory referral to Physical Therapy   Meds ordered this encounter  Medications   oxyCODONE-acetaminophen (PERCOCET) 5-325 MG tablet     Sig: Take 1-2 tablets by mouth every 8 (eight) hours as needed.    Dispense:  40 tablet    Refill:  0    Imaging: No new imaging  PMFS History: Patient Active Problem List   Diagnosis Date Noted   Osteoarthritis of right hip, unspecified osteoarthritis type 06/05/2023   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/03/2023   Primary osteoarthritis of right hip 06/02/2023   Avascular necrosis of bone of hip, right (HCC) 04/07/2023   Diabetes (HCC) 09/19/2022   Acute cystitis with hematuria 10/16/2021   Aneurysm, ophthalmic artery 10/16/2021   Aortic arch atherosclerosis (HCC) 10/16/2021   BMI 22.0-22.9, adult 10/16/2021   Hyperlipidemia 10/16/2021   Vitamin D deficiency 10/16/2021   Arthritis 07/17/2021   History of blood transfusion 07/17/2021   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   Cigarette smoker 01/16/2021   Cardiac murmur 07/11/2020   Constipation    PONV (postoperative nausea and vomiting)    Tremor, essential    Seizure disorder (HCC)    Pneumonia    Peripheral vascular disease (HCC)    History of kidney stones    Essential tremor    Dyslipidemia    Depression    Anxiety    Anemia    Acid reflux    Brain aneurysm 05/14/2020   Intracranial aneurysm 05/14/2020   Chest discomfort 06/22/2018   Mixed dyslipidemia 06/22/2018  Diplopia 09/30/2017   Benign familial tremor 06/17/2017   Migraine without aura and without status migrainosus, not intractable 06/17/2017   Chronic tension-type headache, not intractable 05/10/2014   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   Abnormal involuntary movement 07/26/2013   Memory loss 07/26/2013   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   Aortic arch atherosclerosis (HCC)    Aortic atherosclerosis  (HCC)    Arthritis    knee   Benign familial tremor 06/17/2017   BMI 22.0-22.9, adult    Brain aneurysm 05/14/2020   Cardiac murmur 07/11/2020   no current problems   Chest discomfort 06/22/2018   Chronic tension-type headache, not intractable 05/10/2014   Cigarette smoker 01/16/2021   Constipation    03/15/21 - improved   Depression    Diabetes (HCC)    type 2   Diplopia 09/30/2017   Dyslipidemia    Essential tremor    History of blood transfusion    2 units after a surgical procedure   History of kidney stones    passed stones   HPV in female    Hyperlipidemia    Intracranial aneurysm 05/14/2020   Memory loss    mild   Migraine without aura and without status migrainosus, not intractable 06/17/2017   Mixed dyslipidemia 06/22/2018   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   Peripheral vascular disease (HCC)    blood clot in leg after being kicked by a horse - age 37   Pneumonia    PONV (postoperative nausea and vomiting)    just 1 time after breast biopsy and headache, no anesthesia problems with other surgeries   Seizure disorder (HCC)    last seizure 1980s- controlled with meds   Tremor, essential    Vitamin D deficiency     Family History  Problem Relation Age of Onset   Heart disease Mother    Stroke Mother    Tremor Mother    Heart disease Father    Tremor Father    Atrial fibrillation Sister    Tremor Sister    Diabetes Brother    Tremor Maternal Aunt    Heart attack Maternal Uncle    Heart attack Maternal Uncle    Diabetes Maternal Grandmother     Past Surgical History:  Procedure Laterality Date   APPLICATION OF WOUND VAC Right 05/14/2020   Procedure: APPLICATION OF WOUND VAC;  Surgeon: Leonie Douglas, MD;  Location: [facility] OR;  Service: Vascular;  Laterality: Right;   BREAST BIOPSY Left    X3   CARPAL TUNNEL RELEASE     CATARACT EXTRACTION Bilateral    FALSE ANEURYSM REPAIR Right 05/14/2020   Procedure: REPAIR FALSE  ANEURYSM;  Surgeon: Leonie Douglas, MD;  Location: [facility] OR;  Service: Vascular;  Laterality: Right;   IR 3D INDEPENDENT WKST  03/18/2021   IR ANGIO INTRA EXTRACRAN SEL COM CAROTID INNOMINATE BILAT MOD SED  01/12/2020   IR ANGIO INTRA EXTRACRAN SEL INTERNAL CAROTID UNI L MOD SED  05/14/2020   IR ANGIO INTRA EXTRACRAN SEL INTERNAL CAROTID UNI R MOD SED  03/18/2021   IR ANGIO VERTEBRAL SEL VERTEBRAL BILAT MOD SED  01/12/2020   IR ANGIOGRAM FOLLOW UP STUDY  05/14/2020  IR ANGIOGRAM FOLLOW UP STUDY  05/14/2020   IR RADIOLOGIST EVAL & MGMT  06/06/2020   IR RADIOLOGIST EVAL & MGMT  12/03/2020   IR RADIOLOGIST EVAL & MGMT  04/15/2021   IR TRANSCATH/EMBOLIZ  05/14/2020   IR TRANSCATH/EMBOLIZ  03/18/2021   IR US GUIDE VASC ACCESS RIGHT  01/12/2020   IR US GUIDE VASC ACCESS RIGHT  03/18/2021   RADIOLOGY WITH ANESTHESIA N/A 05/14/2020   Procedure: IR WITH ANESTHESIA ANEURYSM EMBOLIZATION;  Surgeon: Julieanne Cotton, MD;  Location: [facility] OR;  Service: Radiology;  Laterality: N/A;   RADIOLOGY WITH ANESTHESIA N/A 03/18/2021   Procedure: Sharman Crate;  Surgeon: Julieanne Cotton, MD;  Location: [facility] OR;  Service: Radiology;  Laterality: N/A;   SALPINGOOPHORECTOMY     both ovaries removed by laparoscopic   TOTAL HIP ARTHROPLASTY Right 06/03/2023   Procedure: RIGHT TOTAL HIP ARTHROPLASTY ANTERIOR APPROACH;  Surgeon: Tarry Kos, MD;  Location: [facility] OR;  Service: Orthopedics;  Laterality: Right;  3-C   VAGINAL HYSTERECTOMY     Social History   Occupational History   Not on file  Tobacco Use   Smoking status: Every Day    Current packs/day: 0.25    Types: Cigarettes   Smokeless tobacco: Never   Tobacco comments:    2-4 cigarettes per day.  Patient states "trying to quit"  Vaping Use   Vaping status: Never Used  Substance and Sexual Activity   Alcohol use: Not Currently    Comment: rare   Drug use: Never   Sexual activity: Not on file    Comment: Hysterectomy

## 2023-06-18 NOTE — Telephone Encounter (Signed)
Yea, ok for more as I think she would truly benefit.  Are you able to have it extended?

## 2023-07-01 ENCOUNTER — Ambulatory Visit (HOSPITAL_COMMUNITY): Payer: Medicare HMO

## 2023-07-06 ENCOUNTER — Encounter: Payer: Self-pay | Admitting: Neurology

## 2023-07-06 ENCOUNTER — Ambulatory Visit: Payer: Medicare HMO | Admitting: Neurology

## 2023-07-06 VITALS — BP 92/53 | HR 93 | Ht 63.0 in | Wt 117.0 lb

## 2023-07-06 DIAGNOSIS — G250 Essential tremor: Secondary | ICD-10-CM | POA: Diagnosis not present

## 2023-07-06 DIAGNOSIS — G40009 Localization-related (focal) (partial) idiopathic epilepsy and epileptic syndromes with seizures of localized onset, not intractable, without status epilepticus: Secondary | ICD-10-CM

## 2023-07-06 MED ORDER — DIVALPROEX SODIUM ER 500 MG PO TB24: ORAL_TABLET | ORAL | 4 refills | Status: AC
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
## 2023-07-15 DIAGNOSIS — F17210 Nicotine dependence, cigarettes, uncomplicated: Secondary | ICD-10-CM | POA: Diagnosis not present

## 2023-07-15 DIAGNOSIS — D62 Acute posthemorrhagic anemia: Secondary | ICD-10-CM | POA: Diagnosis not present

## 2023-07-15 DIAGNOSIS — I9589 Other hypotension: Secondary | ICD-10-CM | POA: Diagnosis not present

## 2023-07-15 DIAGNOSIS — Z6820 Body mass index (BMI) 20.0-20.9, adult: Secondary | ICD-10-CM | POA: Diagnosis not present

## 2023-07-15 DIAGNOSIS — Z96641 Presence of right artificial hip joint: Secondary | ICD-10-CM | POA: Diagnosis not present

## 2023-07-15 DIAGNOSIS — R7303 Prediabetes: Secondary | ICD-10-CM | POA: Diagnosis not present

## 2023-07-16 ENCOUNTER — Other Ambulatory Visit: Payer: Self-pay | Admitting: Physician Assistant

## 2023-07-16 ENCOUNTER — Other Ambulatory Visit (INDEPENDENT_AMBULATORY_CARE_PROVIDER_SITE_OTHER)

## 2023-07-16 ENCOUNTER — Ambulatory Visit (INDEPENDENT_AMBULATORY_CARE_PROVIDER_SITE_OTHER): Payer: Medicare HMO | Admitting: Physician Assistant

## 2023-07-16 ENCOUNTER — Encounter: Payer: Self-pay | Admitting: Physician Assistant

## 2023-07-16 DIAGNOSIS — Z96641 Presence of right artificial hip joint: Secondary | ICD-10-CM | POA: Diagnosis not present

## 2023-07-16 MED ORDER — HYDROCODONE-ACETAMINOPHEN 5-325 MG PO TABS: 1.0000 | ORAL_TABLET | Freq: Two times a day (BID) | ORAL | 0 refills | Status: DC | PRN

## 2023-07-16 MED ORDER — METHOCARBAMOL 750 MG PO TABS: 750.0000 mg | ORAL_TABLET | Freq: Two times a day (BID) | ORAL | 1 refills | Status: DC | PRN

## 2023-07-16 NOTE — Progress Notes (Signed)
 Post-Op Visit Note   Patient: Lindsay Erickson           Date of Birth: 1960-06-09           MRN: [id number] Visit Date: 07/16/2023 PCP: Buckner Malta, MD   Assessment & Plan:  Chief Complaint:  Chief Complaint  Patient presents with   Right Hip - Pain   Visit Diagnoses:  1. Status post total replacement of right hip     Plan: Patient is a pleasant 63 year old female who comes in today approximately 6 weeks status post right total hip replacement 06/03/2023.  She has been doing much better.  She is primarily ambulating unassisted but does take a walker when she goes out in public which she thinks she will be walking long distances.  Unable to use a cane due to her tremors.  She is taking occasional oxycodone and Robaxin for pain.  She is on a baby aspirin and Plavix daily.  Examination right hip reveals painless hip flexion.  This is however somewhat limited by strength.  She is neurovascularly intact distally.  At this point, I will put in an outpatient referral for physical therapy to somewhere in [place] where they currently live.  I will refill her pain meds but weaned to Norco.  Will refill Robaxin.  Dental prophylaxis reinforced.  Follow-up in 6 weeks for recheck.  Call with concerns or questions.  Follow-Up Instructions: Return in about 6 weeks (around 08/27/2023).   Orders:  Orders Placed This Encounter  Procedures   XR HIP UNILAT W OR W/O PELVIS 2-3 VIEWS RIGHT   Ambulatory referral to Physical Therapy   Meds ordered this encounter  Medications   HYDROcodone-acetaminophen (NORCO/VICODIN) 5-325 MG tablet    Sig: Take 1-2 tablets by mouth 2 (two) times daily as needed for moderate pain (pain score 4-6).    Dispense:  30 tablet    Refill:  0   methocarbamol (ROBAXIN-750) 750 MG tablet    Sig: Take 1 tablet (750 mg total) by mouth 2 (two) times daily as needed for muscle spasms.    Dispense:  20 tablet    Refill:  1    Imaging: XR HIP UNILAT W OR W/O PELVIS 2-3  VIEWS RIGHT Result Date: 07/16/2023 Well-seated prosthesis without complication   PMFS History: Patient Active Problem List   Diagnosis Date Noted   Osteoarthritis of right hip, unspecified osteoarthritis type 06/05/2023   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/03/2023   Primary osteoarthritis of right hip 06/02/2023   Avascular necrosis of bone of hip, right (HCC) 04/07/2023   Diabetes (HCC) 09/19/2022   Acute cystitis with hematuria 10/16/2021   Aneurysm, ophthalmic artery 10/16/2021   Aortic arch atherosclerosis (HCC) 10/16/2021   BMI 22.0-22.9, adult 10/16/2021   Hyperlipidemia 10/16/2021   Vitamin D deficiency 10/16/2021   Arthritis 07/17/2021   History of blood transfusion 07/17/2021   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   Cigarette smoker 01/16/2021   Cardiac murmur 07/11/2020   Constipation    PONV (postoperative nausea and vomiting)    Tremor, essential    Seizure disorder (HCC)    Pneumonia    Peripheral vascular disease (HCC)    History of kidney stones    Essential tremor    Dyslipidemia    Depression    Anxiety    Anemia    Acid reflux    Brain aneurysm 05/14/2020   Intracranial aneurysm 05/14/2020   Chest discomfort 06/22/2018   Mixed dyslipidemia 06/22/2018  Diplopia 09/30/2017   Benign familial tremor 06/17/2017   Migraine without aura and without status migrainosus, not intractable 06/17/2017   Chronic tension-type headache, not intractable 05/10/2014   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   Abnormal involuntary movement 07/26/2013   Memory loss 07/26/2013   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   Aortic arch atherosclerosis (HCC)    Aortic atherosclerosis (HCC)    Arthritis    knee   Benign familial tremor  06/17/2017   BMI 22.0-22.9, adult    Brain aneurysm 05/14/2020   Cardiac murmur 07/11/2020   no current problems   Chest discomfort 06/22/2018   Chronic tension-type headache, not intractable 05/10/2014   Cigarette smoker 01/16/2021   Constipation    03/15/21 - improved   Depression    Diabetes (HCC)    type 2   Diplopia 09/30/2017   Dyslipidemia    Essential tremor    History of blood transfusion    2 units after a surgical procedure   History of kidney stones    passed stones   HPV in female    Hyperlipidemia    Intracranial aneurysm 05/14/2020   Memory loss    mild   Migraine without aura and without status migrainosus, not intractable 06/17/2017   Mixed dyslipidemia 06/22/2018   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   Peripheral vascular disease (HCC)    blood clot in leg after being kicked by a horse - age 43   Pneumonia    PONV (postoperative nausea and vomiting)    just 1 time after breast biopsy and headache, no anesthesia problems with other surgeries   Seizure disorder (HCC)    last seizure 1980s- controlled with meds   Tremor, essential    Vitamin D deficiency     Family History  Problem Relation Age of Onset   Heart disease Mother    Stroke Mother    Tremor Mother    Heart disease Father    Tremor Father    Atrial fibrillation Sister    Tremor Sister    Diabetes Brother    Tremor Maternal Aunt    Heart attack Maternal Uncle    Heart attack Maternal Uncle    Diabetes Maternal Grandmother     Past Surgical History:  Procedure Laterality Date   APPLICATION OF WOUND VAC Right 05/14/2020   Procedure: APPLICATION OF WOUND VAC;  Surgeon: Leonie Douglas, MD;  Location: [facility] OR;  Service: Vascular;  Laterality: Right;   BREAST BIOPSY Left    X3   CARPAL TUNNEL RELEASE     CATARACT EXTRACTION Bilateral    FALSE ANEURYSM REPAIR Right 05/14/2020   Procedure: REPAIR FALSE ANEURYSM;  Surgeon: Leonie Douglas, MD;  Location: [facility]  OR;  Service: Vascular;  Laterality: Right;   IR 3D INDEPENDENT WKST  03/18/2021   IR ANGIO INTRA EXTRACRAN SEL COM CAROTID INNOMINATE BILAT MOD SED  01/12/2020   IR ANGIO INTRA EXTRACRAN SEL INTERNAL CAROTID UNI L MOD SED  05/14/2020   IR ANGIO INTRA EXTRACRAN SEL INTERNAL CAROTID UNI R MOD SED  03/18/2021   IR ANGIO VERTEBRAL SEL VERTEBRAL BILAT MOD SED  01/12/2020   IR ANGIOGRAM FOLLOW UP STUDY  05/14/2020  IR ANGIOGRAM FOLLOW UP STUDY  05/14/2020   IR RADIOLOGIST EVAL & MGMT  06/06/2020   IR RADIOLOGIST EVAL & MGMT  12/03/2020   IR RADIOLOGIST EVAL & MGMT  04/15/2021   IR TRANSCATH/EMBOLIZ  05/14/2020   IR TRANSCATH/EMBOLIZ  03/18/2021   IR US GUIDE VASC ACCESS RIGHT  01/12/2020   IR US GUIDE VASC ACCESS RIGHT  03/18/2021   RADIOLOGY WITH ANESTHESIA N/A 05/14/2020   Procedure: IR WITH ANESTHESIA ANEURYSM EMBOLIZATION;  Surgeon: Julieanne Cotton, MD;  Location: [facility] OR;  Service: Radiology;  Laterality: N/A;   RADIOLOGY WITH ANESTHESIA N/A 03/18/2021   Procedure: Sharman Crate;  Surgeon: Julieanne Cotton, MD;  Location: [facility] OR;  Service: Radiology;  Laterality: N/A;   SALPINGOOPHORECTOMY     both ovaries removed by laparoscopic   TOTAL HIP ARTHROPLASTY Right 06/03/2023   Procedure: RIGHT TOTAL HIP ARTHROPLASTY ANTERIOR APPROACH;  Surgeon: Tarry Kos, MD;  Location: [facility] OR;  Service: Orthopedics;  Laterality: Right;  3-C   VAGINAL HYSTERECTOMY     Social History   Occupational History   Not on file  Tobacco Use   Smoking status: Every Day    Current packs/day: 0.25    Types: Cigarettes   Smokeless tobacco: Never   Tobacco comments:    2-4 cigarettes per day.  Patient states "trying to quit"  Vaping Use   Vaping status: Never Used  Substance and Sexual Activity   Alcohol use: Not Currently    Comment: rare   Drug use: Never   Sexual activity: Not on file    Comment: Hysterectomy

## 2023-07-20 ENCOUNTER — Telehealth: Payer: Self-pay | Admitting: Physician Assistant

## 2023-07-20 NOTE — Telephone Encounter (Signed)
 Pt called requesting referral be sent to physical therapy to [facility] Physical therapy. Pt phone number is [phone]

## 2023-07-21 NOTE — Telephone Encounter (Signed)
 Ok, thanks. Do you know if it was sent to this particular PT office?

## 2023-07-24 ENCOUNTER — Telehealth: Payer: Self-pay | Admitting: Orthopaedic Surgery

## 2023-07-24 NOTE — Telephone Encounter (Signed)
 Op Note faxed [facility] PT, pt has appt. I faxed [phone]

## 2023-07-27 DIAGNOSIS — M25651 Stiffness of right hip, not elsewhere classified: Secondary | ICD-10-CM | POA: Diagnosis not present

## 2023-07-27 DIAGNOSIS — M25551 Pain in right hip: Secondary | ICD-10-CM | POA: Diagnosis not present

## 2023-07-27 DIAGNOSIS — R2689 Other abnormalities of gait and mobility: Secondary | ICD-10-CM | POA: Diagnosis not present

## 2023-07-31 DIAGNOSIS — M25651 Stiffness of right hip, not elsewhere classified: Secondary | ICD-10-CM | POA: Diagnosis not present

## 2023-07-31 DIAGNOSIS — M25551 Pain in right hip: Secondary | ICD-10-CM | POA: Diagnosis not present

## 2023-07-31 DIAGNOSIS — R2689 Other abnormalities of gait and mobility: Secondary | ICD-10-CM | POA: Diagnosis not present

## 2023-08-05 DIAGNOSIS — R2689 Other abnormalities of gait and mobility: Secondary | ICD-10-CM | POA: Diagnosis not present

## 2023-08-05 DIAGNOSIS — M25651 Stiffness of right hip, not elsewhere classified: Secondary | ICD-10-CM | POA: Diagnosis not present

## 2023-08-05 DIAGNOSIS — M25551 Pain in right hip: Secondary | ICD-10-CM | POA: Diagnosis not present

## 2023-08-12 DIAGNOSIS — M25551 Pain in right hip: Secondary | ICD-10-CM | POA: Diagnosis not present

## 2023-08-12 DIAGNOSIS — R2689 Other abnormalities of gait and mobility: Secondary | ICD-10-CM | POA: Diagnosis not present

## 2023-08-12 DIAGNOSIS — M25651 Stiffness of right hip, not elsewhere classified: Secondary | ICD-10-CM | POA: Diagnosis not present

## 2023-08-28 ENCOUNTER — Ambulatory Visit: Admitting: Physician Assistant

## 2023-08-28 DIAGNOSIS — Z96641 Presence of right artificial hip joint: Secondary | ICD-10-CM

## 2023-08-28 NOTE — Progress Notes (Signed)
 Post-Op Visit Note   Patient: Lindsay Erickson           Date of Birth: 12-06-1960           MRN: [id number] Visit Date: 08/28/2023 PCP: Harvest Lineman, MD   Assessment & Plan:  Chief Complaint:  Chief Complaint  Patient presents with   Right Hip - Pain   Visit Diagnoses:  1. Status post total replacement of right hip     Plan: Patient is a 63 female who comes in today 3 months status post right total hip replacement 06/03/2023.  She has been doing very well.  She has finished outpatient physical therapy which significantly helped.  Right hip exam: Minimal discomfort with hip flexion.  No pain with logroll.  She is neurovascularly intact distally.  At this point, she will continue to advance with activity as tolerated.  Follow-up in 3 months for repeat evaluation and AP pelvis x-rays.  Call with concerns or questions.  Follow-Up Instructions: Return in about 3 months (around 11/27/2023).   Orders:  No orders of the defined types were placed in this encounter.  No orders of the defined types were placed in this encounter.   Imaging: No new imaging  PMFS History: Patient Active Problem List   Diagnosis Date Noted   Osteoarthritis of right hip, unspecified osteoarthritis type 06/05/2023   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/03/2023   Primary osteoarthritis of right hip 06/02/2023   Avascular necrosis of bone of hip, right (HCC) 04/07/2023   Diabetes (HCC) 09/19/2022   Acute cystitis with hematuria 10/16/2021   Aneurysm, ophthalmic artery 10/16/2021   Aortic arch atherosclerosis (HCC) 10/16/2021   BMI 22.0-22.9, adult 10/16/2021   Hyperlipidemia 10/16/2021   Vitamin D deficiency 10/16/2021   Arthritis 07/17/2021   History of blood transfusion 07/17/2021   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   Cigarette smoker 01/16/2021   Cardiac murmur 07/11/2020   Constipation    PONV (postoperative nausea and vomiting)    Tremor, essential    Seizure disorder (HCC)     Pneumonia    Peripheral vascular disease (HCC)    History of kidney stones    Essential tremor    Dyslipidemia    Depression    Anxiety    Anemia    Acid reflux    Brain aneurysm 05/14/2020   Intracranial aneurysm 05/14/2020   Chest discomfort 06/22/2018   Mixed dyslipidemia 06/22/2018   Diplopia 09/30/2017   Benign familial tremor 06/17/2017   Migraine without aura and without status migrainosus, not intractable 06/17/2017   Chronic tension-type headache, not intractable 05/10/2014   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   Abnormal involuntary movement 07/26/2013   Memory loss 07/26/2013   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   Aortic arch atherosclerosis (HCC)    Aortic atherosclerosis (HCC)    Arthritis    knee   Benign familial tremor 06/17/2017   BMI 22.0-22.9, adult    Brain aneurysm 05/14/2020   Cardiac murmur 07/11/2020   no current problems   Chest discomfort 06/22/2018   Chronic tension-type headache, not intractable 05/10/2014   Cigarette smoker 01/16/2021   Constipation    03/15/21 - improved   Depression    Diabetes (  HCC)    type 2   Diplopia 09/30/2017   Dyslipidemia    Essential tremor    History of blood transfusion    2 units after a surgical procedure   History of kidney stones    passed stones   HPV in female    Hyperlipidemia    Intracranial aneurysm 05/14/2020   Memory loss    mild   Migraine without aura and without status migrainosus, not intractable 06/17/2017   Mixed dyslipidemia 06/22/2018   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   Peripheral vascular disease (HCC)    blood clot in leg after being kicked by a horse - age 22   Pneumonia    PONV (postoperative nausea  and vomiting)    just 1 time after breast biopsy and headache, no anesthesia problems with other surgeries   Seizure disorder (HCC)    last seizure 1980s- controlled with meds   Tremor, essential    Vitamin D deficiency     Family History  Problem Relation Age of Onset   Heart disease Mother    Stroke Mother    Tremor Mother    Heart disease Father    Tremor Father    Atrial fibrillation Sister    Tremor Sister    Diabetes Brother    Tremor Maternal Aunt    Heart attack Maternal Uncle    Heart attack Maternal Uncle    Diabetes Maternal Grandmother     Past Surgical History:  Procedure Laterality Date   APPLICATION OF WOUND VAC Right 05/14/2020   Procedure: APPLICATION OF WOUND VAC;  Surgeon: Carlene Che, MD;  Location: [facility] OR;  Service: Vascular;  Laterality: Right;   BREAST BIOPSY Left    X3   CARPAL TUNNEL RELEASE     CATARACT EXTRACTION Bilateral    FALSE ANEURYSM REPAIR Right 05/14/2020   Procedure: REPAIR FALSE ANEURYSM;  Surgeon: Carlene Che, MD;  Location: [facility] OR;  Service: Vascular;  Laterality: Right;   IR 3D INDEPENDENT WKST  03/18/2021   IR ANGIO INTRA EXTRACRAN SEL COM CAROTID INNOMINATE BILAT MOD SED  01/12/2020   IR ANGIO INTRA EXTRACRAN SEL INTERNAL CAROTID UNI L MOD SED  05/14/2020   IR ANGIO INTRA EXTRACRAN SEL INTERNAL CAROTID UNI R MOD SED  03/18/2021   IR ANGIO VERTEBRAL SEL VERTEBRAL BILAT MOD SED  01/12/2020   IR ANGIOGRAM FOLLOW UP STUDY  05/14/2020   IR ANGIOGRAM FOLLOW UP STUDY  05/14/2020   IR RADIOLOGIST EVAL & MGMT  06/06/2020   IR RADIOLOGIST EVAL & MGMT  12/03/2020   IR RADIOLOGIST EVAL & MGMT  04/15/2021   IR TRANSCATH/EMBOLIZ  05/14/2020   IR TRANSCATH/EMBOLIZ  03/18/2021   IR US  GUIDE VASC ACCESS RIGHT  01/12/2020   IR US  GUIDE VASC ACCESS RIGHT  03/18/2021   RADIOLOGY WITH ANESTHESIA N/A 05/14/2020   Procedure: IR WITH ANESTHESIA ANEURYSM EMBOLIZATION;  Surgeon: Luellen Sages, MD;  Location: [facility];  Service: Radiology;   Laterality: N/A;   RADIOLOGY WITH ANESTHESIA N/A 03/18/2021   Procedure: Rich Champ;  Surgeon: Luellen Sages, MD;  Location: [facility];  Service: Radiology;  Laterality: N/A;   SALPINGOOPHORECTOMY     both ovaries removed by laparoscopic   TOTAL HIP ARTHROPLASTY Right 06/03/2023   Procedure: RIGHT TOTAL HIP ARTHROPLASTY ANTERIOR APPROACH;  Surgeon: Wes Hamman, MD;  Location: [facility];  Service: Orthopedics;  Laterality: Right;  3-C   VAGINAL HYSTERECTOMY     Social History  Occupational History   Not on file  Tobacco Use   Smoking status: Every Day    Current packs/day: 0.25    Types: Cigarettes   Smokeless tobacco: Never   Tobacco comments:    2-4 cigarettes per day.  Patient states "trying to quit"  Vaping Use   Vaping status: Never Used  Substance and Sexual Activity   Alcohol use: Not Currently    Comment: rare   Drug use: Never   Sexual activity: Not on file    Comment: Hysterectomy

## 2023-09-16 NOTE — Progress Notes (Unsigned)
 Cardiology Office Note:    Date:  09/18/2023   ID:  ARBEDELLA [name], DOB 1960-12-12, MRN [id number]  PCP:  Harvest Lineman, MD   [facility] Providers Cardiologist:  Nelia Balzarine, MD     Referring MD: Harvest Lineman, MD   CC: follow up    History of Present Illness:    Lindsay Erickson is a 63 y.o. female with a hx of PVD, aortic arch atherosclerosis, history of intracranial aneurysm s/p coiling, migraine, seizure disorder, DM2, hyperlipidemia, depression, anxiety, tobacco use, stroke.  Echo in March 2022 revealed an EF of 55 to 60%, mild concentric LVH, grade 1 DD, no valvular abnormalities. 07/20/2018 myocardial perfusion low risk  Established in 2020 with Dr. Revankar at the behest of his PCP for evaluation for chest pain. She underwent a stress evaluation, which was negative.  In 2022 she was found to have a left internal carotid artery paraophthalmic aneurysm underwent embolization with coiling with Dr. Alvira Josephs.  Evaluated by Dr. Lafayette Pierre in July 2023, at that time she was doing well from a cardiac perspective.  She continued to struggle with smoking cessation however wanted to continue to try to stop on her own.  Most recently evaluated by myself in May 2020 for, stable from a cardiac perspective, her A1c had been elevated to 6.7 and she had make considerable changes dropping her A1c back to 5.8, she had cut down on her tobacco abuse significantly as well.  She presents today for follow-up.  She has been doing relatively well, she underwent right hip replacement in January of this year and is doing remarkably well.  She is exercising at home and trying to stay active.  She has been bothered by palpitations, states they have occurred on and off for years, she also mentions that her sister has some type of arrhythmia for which she needed some sort of device-as she is not entirely clear what her condition is. She denies chest pain, dyspnea, pnd, orthopnea, n, v,  dizziness, syncope, edema, weight gain, or early satiety.    EKG Interpretation Date/Time:  Friday Sep 18 2023 15:04:26 EDT Ventricular Rate:  85 PR Interval:  122 QRS Duration:  78 QT Interval:  328 QTC Calculation: 390 R Axis:   28  Text Interpretation: Normal sinus rhythm ST & T wave abnormality, consider lateral ischemia Abnormal ECG When compared with ECG of 26-Dec-2002 08:37, ST now depressed in Inferior leads Nonspecific T wave abnormality now evident in Inferior leads T wave inversion now evident in Anterolateral leads Confirmed by Pattricia Erickson [phone]) on 09/18/2023 3:11:54 PM    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   Aortic arch atherosclerosis (HCC)    Aortic atherosclerosis (HCC)    Arthritis    knee   Avascular necrosis of bone of hip, right (HCC) 04/07/2023   Benign familial tremor 06/17/2017   BMI 22.0-22.9, adult    Brain aneurysm 05/14/2020   Cardiac murmur 07/11/2020   no current problems   Chest discomfort 06/22/2018   Chronic tension-type headache, not intractable 05/10/2014   Cigarette smoker 01/16/2021   Constipation    03/15/21 - improved   Depression    Diabetes (HCC)    type 2   Diplopia 09/30/2017   Dyslipidemia    Essential tremor  History of blood transfusion    2 units after a surgical procedure   History of kidney stones    passed stones   HPV in female    Hyperlipidemia    Intracranial aneurysm 05/14/2020   Memory loss    mild   Migraine without aura and without status migrainosus, not intractable 06/17/2017   Mixed dyslipidemia 06/22/2018   Osteoarthritis of right hip, unspecified osteoarthritis type 06/05/2023   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   Peripheral vascular disease (HCC)    blood clot in leg after being kicked  by a horse - age 63   Pneumonia    PONV (postoperative nausea and vomiting)    just 1 time after breast biopsy and headache, no anesthesia problems with other surgeries   Primary osteoarthritis of right hip 06/02/2023   Seizure disorder (HCC)    last seizure 1980s- controlled with meds   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/03/2023   Tremor, essential    Vitamin D deficiency     Past Surgical History:  Procedure Laterality Date   APPLICATION OF WOUND VAC Right 05/14/2020   Procedure: APPLICATION OF WOUND VAC;  Surgeon: Carlene Che, MD;  Location: [facility] OR;  Service: Vascular;  Laterality: Right;   BREAST BIOPSY Left    X3   CARPAL TUNNEL RELEASE     CATARACT EXTRACTION Bilateral    FALSE ANEURYSM REPAIR Right 05/14/2020   Procedure: REPAIR FALSE ANEURYSM;  Surgeon: Carlene Che, MD;  Location: [facility] OR;  Service: Vascular;  Laterality: Right;   IR 3D INDEPENDENT WKST  03/18/2021   IR ANGIO INTRA EXTRACRAN SEL COM CAROTID INNOMINATE BILAT MOD SED  01/12/2020   IR ANGIO INTRA EXTRACRAN SEL INTERNAL CAROTID UNI L MOD SED  05/14/2020   IR ANGIO INTRA EXTRACRAN SEL INTERNAL CAROTID UNI R MOD SED  03/18/2021   IR ANGIO VERTEBRAL SEL VERTEBRAL BILAT MOD SED  01/12/2020   IR ANGIOGRAM FOLLOW UP STUDY  05/14/2020   IR ANGIOGRAM FOLLOW UP STUDY  05/14/2020   IR RADIOLOGIST EVAL & MGMT  06/06/2020   IR RADIOLOGIST EVAL & MGMT  12/03/2020   IR RADIOLOGIST EVAL & MGMT  04/15/2021   IR TRANSCATH/EMBOLIZ  05/14/2020   IR TRANSCATH/EMBOLIZ  03/18/2021   IR US  GUIDE VASC ACCESS RIGHT  01/12/2020   IR US  GUIDE VASC ACCESS RIGHT  03/18/2021   RADIOLOGY WITH ANESTHESIA N/A 05/14/2020   Procedure: IR WITH ANESTHESIA ANEURYSM EMBOLIZATION;  Surgeon: Luellen Sages, MD;  Location: [facility];  Service: Radiology;  Laterality: N/A;   RADIOLOGY WITH ANESTHESIA N/A 03/18/2021   Procedure: Rich Champ;  Surgeon: Luellen Sages, MD;  Location: [facility];  Service: Radiology;  Laterality: N/A;    SALPINGOOPHORECTOMY     both ovaries removed by laparoscopic   TOTAL HIP ARTHROPLASTY Right 06/03/2023   Procedure: RIGHT TOTAL HIP ARTHROPLASTY ANTERIOR APPROACH;  Surgeon: Wes Hamman, MD;  Location: [facility];  Service: Orthopedics;  Laterality: Right;  3-C   VAGINAL HYSTERECTOMY      Current Medications: Current Meds  Medication Sig   albuterol  (VENTOLIN  HFA) 108 (90 Base) MCG/ACT inhaler Inhale 2 puffs into the lungs every 6 (six) hours as needed for wheezing or shortness of breath.   alendronate (FOSAMAX) 70 MG tablet Take 70 mg by mouth every Wednesday.   aspirin  EC 81 MG tablet Take 81 mg by mouth daily with lunch. Swallow whole.   calcium  carbonate (OSCAL) 1500 (600 Ca) MG TABS  tablet Take 1,500 mg by mouth daily with lunch.   clopidogrel  (PLAVIX ) 75 MG tablet Take 37.5 mg by mouth 2 (two) times a week. Monday and Thursday   divalproex  (DEPAKOTE  ER) 500 MG 24 hr tablet Take 1 tablet every night   docusate sodium  (COLACE) 100 MG capsule Take 1 capsule (100 mg total) by mouth daily as needed.   famotidine (PEPCID) 20 MG tablet Take 20 mg by mouth 2 (two) times daily as needed for heartburn or indigestion.   LORazepam  (ATIVAN ) 0.5 MG tablet Take 0.5 mg by mouth daily as needed for anxiety.   rosuvastatin  (CRESTOR ) 10 MG tablet Take 1 tablet (10 mg total) by mouth at bedtime.   Vitamin D, Ergocalciferol, (DRISDOL) 1.25 MG (50000 UNIT) CAPS capsule Take 50,000 Units by mouth every Wednesday.     Allergies:   Effexor [venlafaxine], Gabapentin, Primidone, Propranolol, Latex, Codeine, and Nickel   Social History   Socioeconomic History   Marital status: Married    Spouse name: Not on file   Number of children: Not on file   Years of education: Not on file   Highest education level: Not on file  Occupational History   Not on file  Tobacco Use   Smoking status: Every Day    Current packs/day: 0.25    Types: Cigarettes   Smokeless tobacco: Never   Tobacco comments:    2-4  cigarettes per day.  Patient states "trying to quit"  Vaping Use   Vaping status: Never Used  Substance and Sexual Activity   Alcohol use: Not Currently    Comment: rare   Drug use: Never   Sexual activity: Not on file    Comment: Hysterectomy  Other Topics Concern   Not on file  Social History Narrative   One story home mobile home   Right handed   Drinks caffeine on occasion   Social Drivers of Health   Financial Resource Strain: Not on file  Food Insecurity: No Food Insecurity (06/05/2023)   Hunger Vital Sign    Worried About Running Out of Food in the Last Year: Never true    Ran Out of Food in the Last Year: Never true  Transportation Needs: Unknown (06/03/2023)   PRAPARE - Administrator, Civil Service (Medical): No    Lack of Transportation (Non-Medical): Not on file  Physical Activity: Not on file  Stress: Not on file  Social Connections: Not on file     Family History: The patient's family history includes Atrial fibrillation in her sister; Diabetes in her brother and maternal grandmother; Heart attack in her maternal uncle and maternal uncle; Heart disease in her father and mother; Stroke in her mother; Tremor in her father, maternal aunt, mother, and sister.  ROS:   Please see the history of present illness.     All other systems reviewed and are negative.  EKGs/Labs/Other Studies Reviewed:    The following studies were reviewed today: Cardiac Studies & Procedures   ______________________________________________________________________________________________   STRESS TESTS  MYOCARDIAL PERFUSION IMAGING 07/20/2018  Narrative  The left ventricular ejection fraction is hyperdynamic (>65%).  Nuclear stress EF: 78%.  Blood pressure demonstrated a normal response to exercise.  There was no ST segment deviation noted during stress.  The study is normal.  This is a low risk study.  No evidence of ischemia or MI.  Normal LVEF.    ECHOCARDIOGRAM  ECHOCARDIOGRAM COMPLETE 07/19/2020  Narrative ECHOCARDIOGRAM REPORT    Patient Name:  Zenon Hilda Date of Exam: 07/19/2020 Medical Rec #:  [id number]          Height:       62.6 in Accession #:    [id number]         Weight:       119.6 lb Date of Birth:  1960/10/11           BSA:          1.547 m Patient Age:    59 years           BP:           114/62 mmHg Patient Gender: F                  HR:           79 bpm. Exam Location:  [facility]  Procedure: 2D Echo  Indications:    Murmur [R01.1 (ICD-10-CM)]  History:        Patient has no prior history of Echocardiogram examinations. Brain aneurysm and PAD, Signs/Symptoms:Chest Pain; Risk Factors:Dyslipidemia.  Sonographer:    Kristen Petri Referring Phys: Pollie Bring [facility]  IMPRESSIONS   1. Left ventricular ejection fraction, by estimation, is 55 to 60%. The left ventricle has normal function. The left ventricle has no regional wall motion abnormalities. There is mild concentric left ventricular hypertrophy. Left ventricular diastolic parameters are consistent with Grade I diastolic dysfunction (impaired relaxation). 2. Right ventricular systolic function is normal. The right ventricular size is normal. 3. The mitral valve is normal in structure. No evidence of mitral valve regurgitation. No evidence of mitral stenosis. 4. The aortic valve is tricuspid. Aortic valve regurgitation is not visualized. No aortic stenosis is present. 5. The inferior vena cava is normal in size with greater than 50% respiratory variability, suggesting right atrial pressure of 3 mmHg.  FINDINGS Left Ventricle: Left ventricular ejection fraction, by estimation, is 55 to 60%. The left ventricle has normal function. The left ventricle has no regional wall motion abnormalities. The left ventricular internal cavity size was normal in size. There is mild concentric left ventricular hypertrophy. Left ventricular diastolic parameters are  consistent with Grade I diastolic dysfunction (impaired relaxation). Normal left ventricular filling pressure.  Right Ventricle: The right ventricular size is normal. No increase in right ventricular wall thickness. Right ventricular systolic function is normal.  Left Atrium: Left atrial size was normal in size.  Right Atrium: Right atrial size was normal in size.  Pericardium: There is no evidence of pericardial effusion.  Mitral Valve: The mitral valve is normal in structure. No evidence of mitral valve regurgitation. No evidence of mitral valve stenosis.  Tricuspid Valve: The tricuspid valve is normal in structure. Tricuspid valve regurgitation is mild . No evidence of tricuspid stenosis.  Aortic Valve: The aortic valve is tricuspid. Aortic valve regurgitation is not visualized. No aortic stenosis is present.  Pulmonic Valve: The pulmonic valve was normal in structure. Pulmonic valve regurgitation is not visualized. No evidence of pulmonic stenosis.  Aorta: The aortic root and ascending aorta are structurally normal, with no evidence of dilitation and the aortic arch was not well visualized.  Venous: The inferior vena cava is normal in size with greater than 50% respiratory variability, suggesting right atrial pressure of 3 mmHg.  IAS/Shunts: No atrial level shunt detected by color flow Doppler.   LEFT VENTRICLE PLAX 2D LVIDd:         3.60 cm     Diastology LVIDs:  2.40 cm     LV e' medial:    5.33 cm/s LV PW:         1.20 cm     LV E/e' medial:  13.8 LV IVS:        1.20 cm     LV e' lateral:   4.68 cm/s LVOT diam:     2.00 cm     LV E/e' lateral: 15.8 LV SV:         67 LV SV Index:   43 LVOT Area:     3.14 cm  LV Volumes (MOD) LV vol d, MOD A2C: 18.9 ml LV vol d, MOD A4C: 33.6 ml LV vol s, MOD A2C: 8.1 ml LV vol s, MOD A4C: 16.1 ml LV SV MOD A2C:     10.8 ml LV SV MOD A4C:     33.6 ml LV SV MOD BP:      13.6 ml  RIGHT VENTRICLE             IVC RV S prime:      10.80 cm/s  IVC diam: 1.20 cm TAPSE (M-mode): 1.5 cm  LEFT ATRIUM             Index       RIGHT ATRIUM           Index LA diam:        2.90 cm 1.87 cm/m  RA Area:     12.80 cm LA Vol (A2C):   26.3 ml 17.00 ml/m RA Volume:   26.80 ml  17.32 ml/m LA Vol (A4C):   20.3 ml 13.12 ml/m LA Biplane Vol: 23.6 ml 15.26 ml/m AORTIC VALVE LVOT Vmax:   99.20 cm/s LVOT Vmean:  70.800 cm/s LVOT VTI:    0.213 m  AORTA Ao Root diam: 2.90 cm Ao Asc diam:  2.80 cm Ao Desc diam: 2.00 cm  MITRAL VALVE               TRICUSPID VALVE MV Area (PHT): 3.61 cm    TR Peak grad:   21.7 mmHg MV Decel Time: 210 msec    TR Vmax:        233.00 cm/s MV E velocity: 73.80 cm/s MV A velocity: 95.20 cm/s  SHUNTS MV E/A ratio:  0.78        Systemic VTI:  0.21 m Systemic Diam: 2.00 cm  Zoe Hinds MD Electronically signed by Zoe Hinds MD Signature Date/Time: 07/19/2020/12:27:44 PM    Final          ______________________________________________________________________________________________       EKG:  EKG is  ordered today.  The ekg ordered today demonstrates NSR, ST and T wave abnormalities however unchanged since prior EKG tracings.   Recent Labs: 09/22/2022: ALT 7 05/29/2023: BUN 28; Creatinine, Ser 0.89; Platelets 282; Potassium 4.0; Sodium 142 06/05/2023: Hemoglobin 8.6  Recent Lipid Panel    Component Value Date/Time   CHOL 190 07/19/2021 0929   TRIG 193 (H) 07/19/2021 0929   HDL 60 07/19/2021 0929   CHOLHDL 3.2 07/19/2021 0929   LDLCALC 97 07/19/2021 0929   LDLDIRECT 65 09/22/2022 1445     Risk Assessment/Calculations:                Physical Exam:    VS:  BP 100/62   Pulse 85   Ht 5\' 3"  (1.6 m)   Wt 118 lb (53.5 kg)   SpO2 97%   BMI 20.90 kg/m  Wt Readings from Last 3 Encounters:  09/18/23 118 lb (53.5 kg)  07/06/23 117 lb (53.1 kg)  06/03/23 117 lb (53.1 kg)     GEN:  Well nourished, well developed in no acute distress HEENT: Normal NECK: No JVD; No  carotid bruits LYMPHATICS: No lymphadenopathy CARDIAC: RRR, no murmurs, rubs, gallops RESPIRATORY:  Clear to auscultation without rales, wheezing or rhonchi  ABDOMEN: Soft, non-tender, non-distended MUSCULOSKELETAL:  No edema; No deformity  SKIN: Warm and dry NEUROLOGIC:  Alert and oriented x 3 PSYCHIATRIC:  Normal affect   ASSESSMENT:    1. Mixed hyperlipidemia   2. Palpitations   3. Aortic arch atherosclerosis (HCC)   4. Diastolic dysfunction   5. Aneurysm, ophthalmic artery     PLAN:    In order of problems listed above:  Aortic atherosclerosis- Stable with no anginal symptoms. No indication for ischemic evaluation.  Continue Crestor  10 mg daily, continue aspirin  81 mg daily.  She is on Plavix  75 mg daily per neurology. Palpitations-we will arrange for a 1 week monitor. Diastolic dysfunction-noted on echocardiogram in 2022.  She is asymptomatic, denies shortness of breath NYHA class I, euvolemic. Hyperlipidemia-most recent LDL was 65, currently on Crestor  10 mg daily.  She states her PCP just check lab work and thinks that they checked her lipid panel as well.  Will reach out and request a copy of her current labs. Ophthalmic artery aneurysm-s/p coiling in 2022. Tobacco abuse-she has cut down drastically to 3 cigarettes/day, encouraged complete cessation, congratulated her on cutting back.  Disposition-monitor x 1 week, will request lab work from PCP, follow-up in 1 year, sooner if needed.           Medication Adjustments/Labs and Tests Ordered: Current medicines are reviewed at length with the patient today.  Concerns regarding medicines are outlined above.  Orders Placed This Encounter  Procedures   LONG TERM MONITOR (3-14 DAYS)   EKG 12-Lead   No orders of the defined types were placed in this encounter.   Patient Instructions  Medication Instructions:  Your physician recommends that you continue on your current medications as directed. Please refer to the  Current Medication list given to you today.  *If you need a refill on your cardiac medications before your next appointment, please call your pharmacy*  Lab Work: NONE If you have labs (blood work) drawn today and your tests are completely normal, you will receive your results only by: MyChart Message (if you have MyChart) OR A paper copy in the mail If you have any lab test that is abnormal or we need to change your treatment, we will call you to review the results.  Testing/Procedures: You have been asked to wear a Zio Heart Monitor today. It is to be worn for 7 days. Please remove the monitor on 5/23 and mail back in the box provided.  If you have any questions about the monitor please call the company at [phone]    Follow-Up: At [facility], you and your health needs are our priority.  As part of our continuing mission to provide you with exceptional heart care, our providers are all part of one team.  This team includes your primary Cardiologist (physician) and Advanced Practice Providers or APPs (Physician Assistants and Nurse Practitioners) who all work together to provide you with the care you need, when you need it.  Your next appointment:   1 year(s)  Provider:   Pattricia Bores, NP [facility])    We recommend signing  up for the patient portal called "MyChart".  Sign up information is provided on this After Visit Summary.  MyChart is used to connect with patients for Virtual Visits (Telemedicine).  Patients are able to view lab/test results, encounter notes, upcoming appointments, etc.  Non-urgent messages can be sent to your provider as well.   To learn more about what you can do with MyChart, go to [email or web address].   Other Instructions        Signed, Terrance Ferretti, NP  09/18/2023 4:21 PM    [facility]

## 2023-09-18 ENCOUNTER — Ambulatory Visit

## 2023-09-18 ENCOUNTER — Ambulatory Visit: Attending: Cardiology | Admitting: Cardiology

## 2023-09-18 ENCOUNTER — Other Ambulatory Visit: Payer: Self-pay

## 2023-09-18 VITALS — BP 100/62 | HR 85 | Ht 63.0 in | Wt 118.0 lb

## 2023-09-18 DIAGNOSIS — E1169 Type 2 diabetes mellitus with other specified complication: Secondary | ICD-10-CM | POA: Diagnosis not present

## 2023-09-18 DIAGNOSIS — R002 Palpitations: Secondary | ICD-10-CM

## 2023-09-18 DIAGNOSIS — E1151 Type 2 diabetes mellitus with diabetic peripheral angiopathy without gangrene: Secondary | ICD-10-CM | POA: Diagnosis not present

## 2023-09-18 DIAGNOSIS — E119 Type 2 diabetes mellitus without complications: Secondary | ICD-10-CM | POA: Diagnosis not present

## 2023-09-18 DIAGNOSIS — E782 Mixed hyperlipidemia: Secondary | ICD-10-CM

## 2023-09-18 DIAGNOSIS — I5189 Other ill-defined heart diseases: Secondary | ICD-10-CM

## 2023-09-18 DIAGNOSIS — Z13820 Encounter for screening for osteoporosis: Secondary | ICD-10-CM | POA: Diagnosis not present

## 2023-09-18 DIAGNOSIS — I700 Atherosclerosis of aorta: Secondary | ICD-10-CM | POA: Diagnosis not present

## 2023-09-18 DIAGNOSIS — Z0000 Encounter for general adult medical examination without abnormal findings: Secondary | ICD-10-CM | POA: Diagnosis not present

## 2023-09-18 DIAGNOSIS — E785 Hyperlipidemia, unspecified: Secondary | ICD-10-CM | POA: Diagnosis not present

## 2023-09-18 DIAGNOSIS — E1142 Type 2 diabetes mellitus with diabetic polyneuropathy: Secondary | ICD-10-CM | POA: Diagnosis not present

## 2023-09-18 DIAGNOSIS — I671 Cerebral aneurysm, nonruptured: Secondary | ICD-10-CM

## 2023-09-18 DIAGNOSIS — E559 Vitamin D deficiency, unspecified: Secondary | ICD-10-CM | POA: Diagnosis not present

## 2023-09-18 DIAGNOSIS — G40909 Epilepsy, unspecified, not intractable, without status epilepticus: Secondary | ICD-10-CM | POA: Diagnosis not present

## 2023-09-18 NOTE — Patient Instructions (Signed)
 Medication Instructions:  Your physician recommends that you continue on your current medications as directed. Please refer to the Current Medication list given to you today.  *If you need a refill on your cardiac medications before your next appointment, please call your pharmacy*  Lab Work: NONE If you have labs (blood work) drawn today and your tests are completely normal, you will receive your results only by: MyChart Message (if you have MyChart) OR A paper copy in the mail If you have any lab test that is abnormal or we need to change your treatment, we will call you to review the results.  Testing/Procedures: You have been asked to wear a Zio Heart Monitor today. It is to be worn for 7 days. Please remove the monitor on 5/23 and mail back in the box provided.  If you have any questions about the monitor please call the company at [phone]    Follow-Up: At [facility], you and your health needs are our priority.  As part of our continuing mission to provide you with exceptional heart care, our providers are all part of one team.  This team includes your primary Cardiologist (physician) and Advanced Practice Providers or APPs (Physician Assistants and Nurse Practitioners) who all work together to provide you with the care you need, when you need it.  Your next appointment:   1 year(s)  Provider:   Pattricia Bores, NP [facility])    We recommend signing up for the patient portal called "MyChart".  Sign up information is provided on this After Visit Summary.  MyChart is used to connect with patients for Virtual Visits (Telemedicine).  Patients are able to view lab/test results, encounter notes, upcoming appointments, etc.  Non-urgent messages can be sent to your provider as well.   To learn more about what you can do with MyChart, go to [email or web address].   Other Instructions

## 2023-10-05 DIAGNOSIS — R002 Palpitations: Secondary | ICD-10-CM | POA: Diagnosis not present

## 2023-10-07 ENCOUNTER — Ambulatory Visit: Payer: Self-pay | Admitting: Cardiology

## 2023-10-07 DIAGNOSIS — R002 Palpitations: Secondary | ICD-10-CM | POA: Diagnosis not present

## 2023-10-30 ENCOUNTER — Encounter (HOSPITAL_COMMUNITY): Payer: Self-pay | Admitting: Interventional Radiology

## 2023-11-27 ENCOUNTER — Ambulatory Visit: Admitting: Physician Assistant

## 2023-11-27 ENCOUNTER — Other Ambulatory Visit (INDEPENDENT_AMBULATORY_CARE_PROVIDER_SITE_OTHER): Payer: Self-pay

## 2023-11-27 DIAGNOSIS — Z96641 Presence of right artificial hip joint: Secondary | ICD-10-CM | POA: Diagnosis not present

## 2023-11-27 DIAGNOSIS — M25551 Pain in right hip: Secondary | ICD-10-CM

## 2023-11-27 NOTE — Progress Notes (Signed)
 Post-Op Visit Note   Patient: Lindsay Erickson           Date of Birth: [birth year]           MRN: [id number] Visit Date: 11/27/2023 PCP: Clemmie Nest, MD   Assessment & Plan:  Chief Complaint:  Chief Complaint  Patient presents with   Right Hip - Follow-up    05/29/23 RT THA   Visit Diagnoses:  1. Status post total replacement of right hip     Plan: Patient is a pleasant 63 year old female who comes in today 6 months status post right total hip replacement 06/03/2023.  She has been doing well.  She notes occasional lateral soreness but nothing more.  She is walking without any assistance.  She continues to work on a home exercise program.  Examination of the right hip reveals painless hip flexion and logroll.  She is neurovascularly intact distally.  At this point, she will continue to advance with activity as tolerated.  Dental prophylaxis reinforced.  Follow-up in 6 months for repeat evaluation and AP pelvis x-rays.  Call with concerns or questions.  Follow-Up Instructions: Return in about 6 months (around 05/29/2024).   Orders:  Orders Placed This Encounter  Procedures   XR Pelvis 1-2 Views   No orders of the defined types were placed in this encounter.   Imaging: XR Pelvis 1-2 Views Result Date: 11/27/2023 Well-seated prosthesis without complication   PMFS History: Patient Active Problem List   Diagnosis Date Noted   Aortic atherosclerosis (HCC)    Osteoarthritis of right hip, unspecified osteoarthritis type 06/05/2023   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/03/2023   Primary osteoarthritis of right hip 06/02/2023   Avascular necrosis of bone of hip, right (HCC) 04/07/2023   Diabetes (HCC) 09/19/2022   Acute cystitis with hematuria 10/16/2021   Aneurysm, ophthalmic artery 10/16/2021   Aortic arch atherosclerosis (HCC) 10/16/2021   BMI 22.0-22.9, adult 10/16/2021   Hyperlipidemia 10/16/2021   Vitamin D deficiency 10/16/2021   Arthritis 07/17/2021    History of blood transfusion 07/17/2021   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   Cigarette smoker 01/16/2021   Cardiac murmur 07/11/2020   Constipation    PONV (postoperative nausea and vomiting)    Tremor, essential    Seizure disorder (HCC)    Pneumonia    Peripheral vascular disease (HCC)    History of kidney stones    Essential tremor    Dyslipidemia    Depression    Anxiety    Anemia    Acid reflux    Brain aneurysm 05/14/2020   Intracranial aneurysm 05/14/2020   Chest discomfort 06/22/2018   Mixed dyslipidemia 06/22/2018   Diplopia 09/30/2017   Benign familial tremor 06/17/2017   Migraine without aura and without status migrainosus, not intractable 06/17/2017   Chronic tension-type headache, not intractable 05/10/2014   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   Abnormal involuntary movement 07/26/2013   Memory loss 07/26/2013   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   Aortic arch atherosclerosis (HCC)    Aortic atherosclerosis (HCC)    Arthritis    knee   Avascular necrosis of bone of hip, right (HCC) 04/07/2023   Benign familial tremor 06/17/2017   BMI  22.0-22.9, adult    Brain aneurysm 05/14/2020   Cardiac murmur 07/11/2020   no current problems   Chest discomfort 06/22/2018   Chronic tension-type headache, not intractable 05/10/2014   Cigarette smoker 01/16/2021   Constipation    03/15/21 - improved   Depression    Diabetes (HCC)    type 2   Diplopia 09/30/2017   Dyslipidemia    Essential tremor    History of blood transfusion    2 units after a surgical procedure   History of kidney stones    passed stones   HPV in female    Hyperlipidemia    Intracranial aneurysm 05/14/2020   Memory loss    mild   Migraine without  aura and without status migrainosus, not intractable 06/17/2017   Mixed dyslipidemia 06/22/2018   Osteoarthritis of right hip, unspecified osteoarthritis type 06/05/2023   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   Peripheral vascular disease (HCC)    blood clot in leg after being kicked by a horse - age 55   Pneumonia    PONV (postoperative nausea and vomiting)    just 1 time after breast biopsy and headache, no anesthesia problems with other surgeries   Primary osteoarthritis of right hip 06/02/2023   Seizure disorder (HCC)    last seizure 1980s- controlled with meds   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/03/2023   Tremor, essential    Vitamin D deficiency     Family History  Problem Relation Age of Onset   Heart disease Mother    Stroke Mother    Tremor Mother    Heart disease Father    Tremor Father    Atrial fibrillation Sister    Tremor Sister    Diabetes Brother    Tremor Maternal Aunt    Heart attack Maternal Uncle    Heart attack Maternal Uncle    Diabetes Maternal Grandmother     Past Surgical History:  Procedure Laterality Date   APPLICATION OF WOUND VAC Right 05/14/2020   Procedure: APPLICATION OF WOUND VAC;  Surgeon: Magda Debby SAILOR, MD;  Location: [facility] OR;  Service: Vascular;  Laterality: Right;   BREAST BIOPSY Left    X3   CARPAL TUNNEL RELEASE     CATARACT EXTRACTION Bilateral    FALSE ANEURYSM REPAIR Right 05/14/2020   Procedure: REPAIR FALSE ANEURYSM;  Surgeon: Magda Debby SAILOR, MD;  Location: [facility] OR;  Service: Vascular;  Laterality: Right;   IR 3D INDEPENDENT WKST  03/18/2021   IR ANGIO INTRA EXTRACRAN SEL COM CAROTID INNOMINATE BILAT MOD SED  01/12/2020   IR ANGIO INTRA EXTRACRAN SEL INTERNAL CAROTID UNI L MOD SED  05/14/2020   IR ANGIO INTRA EXTRACRAN SEL INTERNAL CAROTID UNI R MOD SED  03/18/2021   IR ANGIO VERTEBRAL SEL VERTEBRAL BILAT MOD SED  01/12/2020   IR ANGIOGRAM FOLLOW UP STUDY  05/14/2020   IR ANGIOGRAM  FOLLOW UP STUDY  05/14/2020   IR RADIOLOGIST EVAL & MGMT  06/06/2020   IR RADIOLOGIST EVAL & MGMT  12/03/2020   IR RADIOLOGIST EVAL & MGMT  04/15/2021   IR TRANSCATH/EMBOLIZ  05/14/2020   IR TRANSCATH/EMBOLIZ  03/18/2021   IR US  GUIDE VASC ACCESS RIGHT  01/12/2020   IR US  GUIDE VASC ACCESS RIGHT  03/18/2021   RADIOLOGY WITH ANESTHESIA N/A 05/14/2020   Procedure: IR WITH ANESTHESIA ANEURYSM EMBOLIZATION;  Surgeon: Dolphus Carrion, MD;  Location: [facility] OR;  Service: Radiology;  Laterality: N/A;   RADIOLOGY WITH  ANESTHESIA N/A 03/18/2021   Procedure: BARBARANN;  Surgeon: Dolphus Carrion, MD;  Location: [facility] OR;  Service: Radiology;  Laterality: N/A;   SALPINGOOPHORECTOMY     both ovaries removed by laparoscopic   TOTAL HIP ARTHROPLASTY Right 06/03/2023   Procedure: RIGHT TOTAL HIP ARTHROPLASTY ANTERIOR APPROACH;  Surgeon: Jerri Kay HERO, MD;  Location: [facility] OR;  Service: Orthopedics;  Laterality: Right;  3-C   VAGINAL HYSTERECTOMY     Social History   Occupational History   Not on file  Tobacco Use   Smoking status: Every Day    Current packs/day: 0.25    Types: Cigarettes   Smokeless tobacco: Never   Tobacco comments:    2-4 cigarettes per day.  Patient states trying to quit  Vaping Use   Vaping status: Never Used  Substance and Sexual Activity   Alcohol use: Not Currently    Comment: rare   Drug use: Never   Sexual activity: Not on file    Comment: Hysterectomy

## 2023-12-14 DIAGNOSIS — H524 Presbyopia: Secondary | ICD-10-CM | POA: Diagnosis not present

## 2023-12-14 DIAGNOSIS — H5203 Hypermetropia, bilateral: Secondary | ICD-10-CM | POA: Diagnosis not present

## 2023-12-14 DIAGNOSIS — Z961 Presence of intraocular lens: Secondary | ICD-10-CM | POA: Diagnosis not present

## 2023-12-18 DIAGNOSIS — E1151 Type 2 diabetes mellitus with diabetic peripheral angiopathy without gangrene: Secondary | ICD-10-CM | POA: Diagnosis not present

## 2023-12-18 DIAGNOSIS — G40909 Epilepsy, unspecified, not intractable, without status epilepticus: Secondary | ICD-10-CM | POA: Diagnosis not present

## 2023-12-18 DIAGNOSIS — F17210 Nicotine dependence, cigarettes, uncomplicated: Secondary | ICD-10-CM | POA: Diagnosis not present

## 2023-12-18 DIAGNOSIS — E1165 Type 2 diabetes mellitus with hyperglycemia: Secondary | ICD-10-CM | POA: Diagnosis not present

## 2023-12-18 DIAGNOSIS — G250 Essential tremor: Secondary | ICD-10-CM | POA: Diagnosis not present

## 2023-12-18 DIAGNOSIS — E785 Hyperlipidemia, unspecified: Secondary | ICD-10-CM | POA: Diagnosis not present

## 2023-12-18 DIAGNOSIS — E1142 Type 2 diabetes mellitus with diabetic polyneuropathy: Secondary | ICD-10-CM | POA: Diagnosis not present

## 2023-12-18 DIAGNOSIS — M810 Age-related osteoporosis without current pathological fracture: Secondary | ICD-10-CM | POA: Diagnosis not present

## 2023-12-18 DIAGNOSIS — E1169 Type 2 diabetes mellitus with other specified complication: Secondary | ICD-10-CM | POA: Diagnosis not present

## 2023-12-24 ENCOUNTER — Other Ambulatory Visit (HOSPITAL_COMMUNITY): Payer: Self-pay

## 2023-12-25 ENCOUNTER — Telehealth: Payer: Self-pay | Admitting: Pharmacist

## 2023-12-25 NOTE — Progress Notes (Signed)
 Attempted to contact patient for medication management re: Doreen. Left HIPAA compliant message for patient to return my call at their convenience.   Annabella Galla, PharmD Clinical Pharmacist [place] Direct Dial: [phone]

## 2023-12-28 ENCOUNTER — Telehealth: Payer: Self-pay

## 2023-12-28 ENCOUNTER — Telehealth: Payer: Self-pay | Admitting: Pharmacist

## 2023-12-28 DIAGNOSIS — K6389 Other specified diseases of intestine: Secondary | ICD-10-CM | POA: Diagnosis not present

## 2023-12-28 DIAGNOSIS — Z1211 Encounter for screening for malignant neoplasm of colon: Secondary | ICD-10-CM | POA: Diagnosis not present

## 2023-12-28 NOTE — Progress Notes (Signed)
   12/28/2023  Patient ID: Lindsay Erickson, female   DOB: 08-04-1960, 63 y.o.   MRN: [id number]  Telephonic engagement today with Lindsay Erickson regarding recent labs. Discussed with patient that Dr. Clemmie would like to start Farxiga 5 mg 1 tablet PO daily for increased proteinuria. Patient is agreeable to start medication. Reviewed anticipated costs with patient and patient is interested in applying for the AZ&Me patient assistance program. I will send to [facility] Patient Advocate, Inocente Butcher, CPhT to coordinate application.   Annabella Galla, PharmD Clinical Pharmacist [place] Direct Dial: [phone]

## 2023-12-28 NOTE — Telephone Encounter (Signed)
 Completed patient assistance application for AZ&Me for Doreen and emailed to Tiffany to have patient and provider review and sign at upcoming appointment.

## 2023-12-29 NOTE — Telephone Encounter (Signed)
 PAP: Application for Marcelline Deist has been submitted to [employer] (AZ&Me), via fax

## 2023-12-29 NOTE — Progress Notes (Signed)
 Pharmacy Medication Assistance Program Note    12/29/2023  Patient ID: Lindsay Erickson, female   DOB: [birth year], 63 y.o.   MRN: [id number]     12/28/2023  Outreach Medication One  Initial Outreach Date (Medication One) 12/28/2023  Manufacturer Medication One [employer]  [employer] Farxiga  Dose of Farxiga 5mg   Type of Radiographer, therapeutic Assistance  Date Application Sent to Patient 12/28/2023  Application Items Requested Application  Date Application Sent to Prescriber 12/28/2023  Name of Prescriber Delon Contes  Date Application Received From Patient 12/29/2023  Application Items Received From Patient Application  Date Application Received From Provider 12/29/2023  Date Application Submitted to Manufacturer 12/29/2023  Method Application Sent to Manufacturer Fax  Patient Assistance Determination Approved  Approval Start Date 12/29/2023  Approval End Date 05/04/2024  Patient Notification Method Telephone Call  Telephone Call Outcome Left Voicemail     Signature

## 2023-12-29 NOTE — Telephone Encounter (Signed)
 PAP: Patient assistance application for Farxiga has been approved by PAP Companies: AZ[employer]ME from 12/29/2023 to 05/04/2024. Medication should be delivered to PAP Delivery: Home. For further shipping updates, please contact [employer] (AZ[employer]Me) at [phone]. Patient ID is: [id number]

## 2024-01-08 ENCOUNTER — Telehealth: Payer: Self-pay | Admitting: *Deleted

## 2024-01-08 NOTE — Telephone Encounter (Signed)
   Pre-operative Risk Assessment    Patient Name: Lindsay Erickson  DOB: August 21, 1960 MRN: [id number]      Request for Surgical Clearance    Procedure:  Colonoscopy  Date of Surgery:  Clearance 01/28/24                                 Surgeon:  Dr. Ozell BIRCH. [place] Surgeon's Group or Practice Name:  [facility] [place] Phone number:  [phone] Fax number:  [phone]   Type of Clearance Requested:   - Medical  - Pharmacy:  Hold Aspirin  and Clopidogrel  (Plavix )     Type of Anesthesia:  Propoful   Additional requests/questions:    [name]   01/08/2024, 5:00 PM

## 2024-01-11 ENCOUNTER — Telehealth: Payer: Self-pay | Admitting: *Deleted

## 2024-01-11 NOTE — Telephone Encounter (Signed)
   Name: NAYELIZ HIPP  DOB: Dec 18, 1960  MRN: [id number]  Primary Cardiologist: Jennifer JONELLE Crape, MD   Preoperative team, please contact this patient and set up a phone call appointment for further preoperative risk assessment. Please obtain consent and complete medication review. Thank you for your help.  I confirm that guidance regarding antiplatelet and oral anticoagulation therapy has been completed and, if necessary, noted below.  Her Plavix  may be held for 5 days prior to her procedure. Regarding ASA therapy, we recommend continuation of ASA throughout the perioperative period. However, if the surgeon feels that cessation of ASA is required in the perioperative period, it may be stopped 5-7 days prior to surgery with a plan to resume it as soon as felt to be feasible from a surgical standpoint in the post-operative period.    I also confirmed the patient resides in the state of [place] . As per [facility] Medical Board telemedicine laws, the patient must reside in the state in which the provider is licensed.   Josefa CHRISTELLA Beauvais, NP 01/11/2024, 11:14 AM [facility]

## 2024-01-11 NOTE — Telephone Encounter (Signed)
 Pt has been scheduled tele preop appt 01/19/24. Med rec and consent are done.

## 2024-01-11 NOTE — Telephone Encounter (Signed)
 Pt has  been scheduled tele preop appt 01/19/24. Med rec and consent are done.    Patient Consent for Virtual Visit        Lindsay Erickson has provided verbal consent on 01/11/2024 for a virtual visit (video or telephone).   CONSENT FOR VIRTUAL VISIT FOR:  Lindsay Erickson  By participating in this virtual visit I agree to the following:  I hereby voluntarily request, consent and authorize [facility] and its employed or contracted physicians, physician assistants, nurse practitioners or other licensed health care professionals (the Practitioner), to provide me with telemedicine health care services (the "Services) as deemed necessary by the treating Practitioner. I acknowledge and consent to receive the Services by the Practitioner via telemedicine. I understand that the telemedicine visit will involve communicating with the Practitioner through live audiovisual communication technology and the disclosure of certain medical information by electronic transmission. I acknowledge that I have been given the opportunity to request an in-person assessment or other available alternative prior to the telemedicine visit and am voluntarily participating in the telemedicine visit.  I understand that I have the right to withhold or withdraw my consent to the use of telemedicine in the course of my care at any time, without affecting my right to future care or treatment, and that the Practitioner or I may terminate the telemedicine visit at any time. I understand that I have the right to inspect all information obtained and/or recorded in the course of the telemedicine visit and may receive copies of available information for a reasonable fee.  I understand that some of the potential risks of receiving the Services via telemedicine include:  Delay or interruption in medical evaluation due to technological equipment failure or disruption; Information transmitted may not be sufficient (e.g. poor  resolution of images) to allow for appropriate medical decision making by the Practitioner; and/or  In rare instances, security protocols could fail, causing a breach of personal health information.  Furthermore, I acknowledge that it is my responsibility to provide information about my medical history, conditions and care that is complete and accurate to the best of my ability. I acknowledge that Practitioner's advice, recommendations, and/or decision may be based on factors not within their control, such as incomplete or inaccurate data provided by me or distortions of diagnostic images or specimens that may result from electronic transmissions. I understand that the practice of medicine is not an exact science and that Practitioner makes no warranties or guarantees regarding treatment outcomes. I acknowledge that a copy of this consent can be made available to me via my patient portal [facility] MyChart), or I can request a printed copy by calling the office of [facility].    I understand that my insurance will be billed for this visit.   I have read or had this consent read to me. I understand the contents of this consent, which adequately explains the benefits and risks of the Services being provided via telemedicine.  I have been provided ample opportunity to ask questions regarding this consent and the Services and have had my questions answered to my satisfaction. I give my informed consent for the services to be provided through the use of telemedicine in my medical care

## 2024-01-19 ENCOUNTER — Ambulatory Visit: Attending: Cardiology

## 2024-01-19 DIAGNOSIS — Z01810 Encounter for preprocedural cardiovascular examination: Secondary | ICD-10-CM | POA: Diagnosis not present

## 2024-01-19 NOTE — Progress Notes (Signed)
 Virtual Visit via Telephone Note   Because of Lindsay Erickson co-morbid illnesses, she is at least at moderate risk for complications without adequate follow up.  This format is felt to be most appropriate for this patient at this time.  Due to technical limitations with video connection (technology), today's appointment will be conducted as an audio only telehealth visit, and Lindsay Erickson verbally agreed to proceed in this manner.   All issues noted in this document were discussed and addressed.  No physical exam could be performed with this format.  Evaluation Performed:  Preoperative cardiovascular risk assessment _____________   Date:  01/19/2024   Patient ID:  Lindsay Erickson, DOB 06/06/1960, MRN [id number] Patient Location:  Home Provider location:   Office  Primary Care Provider:  Clemmie Nest, MD Primary Cardiologist:  Jennifer JONELLE Crape, MD Chief Complaint / Patient Profile  63 y.o. y/o female with a h/o PVD, aortic arch atherosclerosis, intracranial aneurysm s/p coiling, migraine, seizure disorder, type II DM, hyperlipidemia, depression, anxiety, tobacco use who is pending colonoscopy and presents today for telephonic preoperative cardiovascular risk assessment. History of Present Illness  Lindsay Erickson is a 64 y.o. female who presents via audio/video conferencing for a telehealth visit today.  Pt was last seen in cardiology clinic on 09/18/2023 by Nest Hoover, NP.  At that time Lindsay Erickson was doing well.  The patient is now pending procedure as outlined above. Since her last visit, she has remained stable cardiac standpoint. Today she denies chest pain, shortness of breath, lower extremity edema, fatigue, palpitations, melena, hematuria, hemoptysis, diaphoresis, weakness, presyncope, syncope, orthopnea, and PND.  She is able to achieve greater than 4 METS of activity with ADLs, walking, climbing stairs and doing some household chores. 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   Aortic arch atherosclerosis (HCC)    Aortic atherosclerosis (HCC)    Arthritis    knee   Avascular necrosis of bone of hip, right (HCC) 04/07/2023   Benign familial tremor 06/17/2017   BMI 22.0-22.9, adult    Brain aneurysm 05/14/2020   Cardiac murmur 07/11/2020   no current problems   Chest discomfort 06/22/2018   Chronic tension-type headache, not intractable 05/10/2014   Cigarette smoker 01/16/2021   Constipation    03/15/21 - improved   Depression    Diabetes (HCC)    type 2   Diplopia 09/30/2017   Dyslipidemia    Essential tremor    History of blood transfusion    2 units after a surgical procedure   History of kidney stones    passed stones   HPV in female    Hyperlipidemia    Intracranial aneurysm 05/14/2020   Memory loss    mild   Migraine without aura and without status migrainosus, not intractable 06/17/2017   Mixed dyslipidemia 06/22/2018   Osteoarthritis of right hip, unspecified osteoarthritis type 06/05/2023   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   Peripheral vascular disease (HCC)    blood clot in leg after being kicked by a horse - age 50   Pneumonia    PONV (postoperative nausea and vomiting)    just 1 time after breast biopsy and headache, no anesthesia problems with other surgeries  Primary osteoarthritis of right hip 06/02/2023   Seizure disorder (HCC)    last seizure 1980s- controlled with meds   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/03/2023   Tremor, essential    Vitamin D deficiency    Past Surgical History:  Procedure Laterality Date   APPLICATION OF WOUND VAC Right 05/14/2020   Procedure: APPLICATION OF WOUND VAC;  Surgeon: Magda Debby SAILOR, MD;  Location: [facility] OR;  Service:  Vascular;  Laterality: Right;   BREAST BIOPSY Left    X3   CARPAL TUNNEL RELEASE     CATARACT EXTRACTION Bilateral    FALSE ANEURYSM REPAIR Right 05/14/2020   Procedure: REPAIR FALSE ANEURYSM;  Surgeon: Magda Debby SAILOR, MD;  Location: [facility] OR;  Service: Vascular;  Laterality: Right;   IR 3D INDEPENDENT WKST  03/18/2021   IR ANGIO INTRA EXTRACRAN SEL COM CAROTID INNOMINATE BILAT MOD SED  01/12/2020   IR ANGIO INTRA EXTRACRAN SEL INTERNAL CAROTID UNI L MOD SED  05/14/2020   IR ANGIO INTRA EXTRACRAN SEL INTERNAL CAROTID UNI R MOD SED  03/18/2021   IR ANGIO VERTEBRAL SEL VERTEBRAL BILAT MOD SED  01/12/2020   IR ANGIOGRAM FOLLOW UP STUDY  05/14/2020   IR ANGIOGRAM FOLLOW UP STUDY  05/14/2020   IR RADIOLOGIST EVAL & MGMT  06/06/2020   IR RADIOLOGIST EVAL & MGMT  12/03/2020   IR RADIOLOGIST EVAL & MGMT  04/15/2021   IR TRANSCATH/EMBOLIZ  05/14/2020   IR TRANSCATH/EMBOLIZ  03/18/2021   IR US  GUIDE VASC ACCESS RIGHT  01/12/2020   IR US  GUIDE VASC ACCESS RIGHT  03/18/2021   RADIOLOGY WITH ANESTHESIA N/A 05/14/2020   Procedure: IR WITH ANESTHESIA ANEURYSM EMBOLIZATION;  Surgeon: Dolphus Carrion, MD;  Location: [facility] OR;  Service: Radiology;  Laterality: N/A;   RADIOLOGY WITH ANESTHESIA N/A 03/18/2021   Procedure: BARBARANN;  Surgeon: Dolphus Carrion, MD;  Location: [facility] OR;  Service: Radiology;  Laterality: N/A;   SALPINGOOPHORECTOMY     both ovaries removed by laparoscopic   TOTAL HIP ARTHROPLASTY Right 06/03/2023   Procedure: RIGHT TOTAL HIP ARTHROPLASTY ANTERIOR APPROACH;  Surgeon: Jerri Kay HERO, MD;  Location: [facility] OR;  Service: Orthopedics;  Laterality: Right;  3-C   VAGINAL HYSTERECTOMY     Allergies Allergies  Allergen Reactions   Effexor [Venlafaxine] Other (See Comments)    Irritable/hyper   Gabapentin Other (See Comments)    Insomnia   Primidone Other (See Comments)    Ineffective    Propranolol Swelling    Can't take beta blockers. Didn't help tremors.   Latex Itching    Codeine Itching   Nickel Itching and Rash    Home Medications    Prior to Admission medications   Medication Sig Start Date End Date Taking? Authorizing Provider  albuterol  (VENTOLIN  HFA) 108 (90 Base) MCG/ACT inhaler Inhale 2 puffs into the lungs every 6 (six) hours as needed for wheezing or shortness of breath. 09/15/22   [provider]  alendronate (FOSAMAX) 70 MG tablet Take 70 mg by mouth every Wednesday. 05/15/21   [provider]  aspirin  EC 81 MG tablet Take 81 mg by mouth daily with lunch. Swallow whole.    [provider]  calcium  carbonate (OSCAL) 1500 (600 Ca) MG TABS tablet Take 1,500 mg by mouth daily with lunch.    [provider]  clopidogrel  (PLAVIX ) 75 MG tablet Take 37.5 mg by mouth 2 (two) times a week. Monday and Thursday    [provider]  divalproex  (DEPAKOTE   ER) 500 MG 24 hr tablet Take 1 tablet every night 07/06/23   Georjean Darice HERO, MD  docusate sodium  (COLACE) 100 MG capsule Take 1 capsule (100 mg total) by mouth daily as needed. 05/26/23 05/25/24  Jule Ronal CROME, PA-C  famotidine (PEPCID) 20 MG tablet Take 20 mg by mouth 2 (two) times daily as needed for heartburn or indigestion.    [provider]  LORazepam  (ATIVAN ) 0.5 MG tablet Take 0.5 mg by mouth daily as needed for anxiety. 09/10/21   [provider]  rosuvastatin  (CRESTOR ) 10 MG tablet Take 1 tablet (10 mg total) by mouth at bedtime. 07/19/21   Revankar, Rajan R, MD  Vitamin D, Ergocalciferol, (DRISDOL) 1.25 MG (50000 UNIT) CAPS capsule Take 50,000 Units by mouth every Wednesday. 09/24/21   [provider]    Physical Exam  Vital Signs:  Lindsay Erickson does not have vital signs available for review today Given telephonic nature of communication, physical exam is limited. AAOx3. NAD. Normal affect.  Speech and respirations are unlabored. Accessory Clinical Findings  None Assessment & Plan    1.  Preoperative Cardiovascular Risk  Assessment: Ms. Gilmer perioperative risk of a major cardiac event is 0.4% according to the Revised Cardiac Risk Index (RCRI).  Therefore, she is at low risk for perioperative complications.   Her functional capacity is fair at 5.07 METs according to the Duke Activity Status Index (DASI). Recommendations: According to ACC/AHA guidelines, no further cardiovascular testing needed.  The patient may proceed to surgery at acceptable risk.   Antiplatelet and/or Anticoagulation Recommendations: Per notes patient is on Plavix  per neurology, recommendations regarding holding of Plavix  will need to come from neurology. Regarding ASA therapy, we recommend continuation of ASA throughout the perioperative period.  However, if the surgeon feels that cessation of ASA is required in the perioperative period, it may be stopped 5-7 days prior to surgery with a plan to resume it as soon as felt to be feasible from a surgical standpoint in the post-operative period.   The patient was advised that if she develops new symptoms prior to surgery to contact our office to arrange for a follow-up visit, and she verbalized understanding.  A copy of this note will be routed to requesting surgeon.  Time:   Today, I have spent 12 minutes with the patient with telehealth technology discussing medical history, symptoms, and management plan.     Tracye Szuch D Marny Smethers, NP  01/19/2024, 12:05 PM

## 2024-01-21 ENCOUNTER — Telehealth: Payer: Self-pay

## 2024-01-21 DIAGNOSIS — E1165 Type 2 diabetes mellitus with hyperglycemia: Secondary | ICD-10-CM | POA: Diagnosis not present

## 2024-01-21 NOTE — Telephone Encounter (Signed)
 2026 Renewal  Completed renewal application for 2026 Farxiga with [employer].  Patient has an appointment in October.  Will fax to office to have patient and provider review and sign.

## 2024-01-28 DIAGNOSIS — R011 Cardiac murmur, unspecified: Secondary | ICD-10-CM | POA: Diagnosis not present

## 2024-01-28 DIAGNOSIS — K635 Polyp of colon: Secondary | ICD-10-CM | POA: Diagnosis not present

## 2024-01-28 DIAGNOSIS — D128 Benign neoplasm of rectum: Secondary | ICD-10-CM | POA: Diagnosis not present

## 2024-01-28 DIAGNOSIS — Z1211 Encounter for screening for malignant neoplasm of colon: Secondary | ICD-10-CM | POA: Diagnosis not present

## 2024-01-28 DIAGNOSIS — E785 Hyperlipidemia, unspecified: Secondary | ICD-10-CM | POA: Diagnosis not present

## 2024-01-28 DIAGNOSIS — F17210 Nicotine dependence, cigarettes, uncomplicated: Secondary | ICD-10-CM | POA: Diagnosis not present

## 2024-01-28 DIAGNOSIS — F32A Depression, unspecified: Secondary | ICD-10-CM | POA: Diagnosis not present

## 2024-01-28 DIAGNOSIS — K6389 Other specified diseases of intestine: Secondary | ICD-10-CM | POA: Diagnosis not present

## 2024-01-28 DIAGNOSIS — Z860101 Personal history of adenomatous and serrated colon polyps: Secondary | ICD-10-CM | POA: Diagnosis not present

## 2024-01-28 DIAGNOSIS — E119 Type 2 diabetes mellitus without complications: Secondary | ICD-10-CM | POA: Diagnosis not present

## 2024-01-28 DIAGNOSIS — K621 Rectal polyp: Secondary | ICD-10-CM | POA: Diagnosis not present

## 2024-02-11 DIAGNOSIS — Z1231 Encounter for screening mammogram for malignant neoplasm of breast: Secondary | ICD-10-CM | POA: Diagnosis not present

## 2024-02-11 NOTE — Telephone Encounter (Signed)
 LVM--multiple times to pt husband to call the office back.

## 2024-02-11 NOTE — Telephone Encounter (Signed)
-----   Message from Darice CHRISTELLA Shivers sent at 01/31/2024 10:55 PM EDT ----- Regarding: pls call Pls call patient/husband and let them know I received a request for clearance for her colonoscopy because she is on Plavix . Dr. Dolphus was prescribing/recommending this but he has retired. The last note I see from 2023 says he wanted a follow-up CTA head and neck with and without contrast in 6 months but I don't see that done. Recommend CTA head and neck with and without contrast before we can fill out any clearance forms. Thanks

## 2024-02-12 DIAGNOSIS — Z1231 Encounter for screening mammogram for malignant neoplasm of breast: Secondary | ICD-10-CM | POA: Diagnosis not present

## 2024-02-15 NOTE — Telephone Encounter (Signed)
 Received provider portion of application

## 2024-02-25 DIAGNOSIS — E785 Hyperlipidemia, unspecified: Secondary | ICD-10-CM | POA: Diagnosis not present

## 2024-02-25 DIAGNOSIS — M1990 Unspecified osteoarthritis, unspecified site: Secondary | ICD-10-CM | POA: Diagnosis not present

## 2024-02-25 DIAGNOSIS — E1151 Type 2 diabetes mellitus with diabetic peripheral angiopathy without gangrene: Secondary | ICD-10-CM | POA: Diagnosis not present

## 2024-02-25 DIAGNOSIS — I700 Atherosclerosis of aorta: Secondary | ICD-10-CM | POA: Diagnosis not present

## 2024-02-25 DIAGNOSIS — J45990 Exercise induced bronchospasm: Secondary | ICD-10-CM | POA: Diagnosis not present

## 2024-02-25 DIAGNOSIS — F17210 Nicotine dependence, cigarettes, uncomplicated: Secondary | ICD-10-CM | POA: Diagnosis not present

## 2024-02-25 DIAGNOSIS — G40909 Epilepsy, unspecified, not intractable, without status epilepticus: Secondary | ICD-10-CM | POA: Diagnosis not present

## 2024-02-25 DIAGNOSIS — G43909 Migraine, unspecified, not intractable, without status migrainosus: Secondary | ICD-10-CM | POA: Diagnosis not present

## 2024-02-25 DIAGNOSIS — K219 Gastro-esophageal reflux disease without esophagitis: Secondary | ICD-10-CM | POA: Diagnosis not present

## 2024-02-25 DIAGNOSIS — Z7982 Long term (current) use of aspirin: Secondary | ICD-10-CM | POA: Diagnosis not present

## 2024-02-25 DIAGNOSIS — Z885 Allergy status to narcotic agent status: Secondary | ICD-10-CM | POA: Diagnosis not present

## 2024-02-25 DIAGNOSIS — Z7902 Long term (current) use of antithrombotics/antiplatelets: Secondary | ICD-10-CM | POA: Diagnosis not present

## 2024-02-25 DIAGNOSIS — R32 Unspecified urinary incontinence: Secondary | ICD-10-CM | POA: Diagnosis not present

## 2024-02-25 DIAGNOSIS — F419 Anxiety disorder, unspecified: Secondary | ICD-10-CM | POA: Diagnosis not present

## 2024-02-25 DIAGNOSIS — G250 Essential tremor: Secondary | ICD-10-CM | POA: Diagnosis not present

## 2024-02-25 DIAGNOSIS — Z8541 Personal history of malignant neoplasm of cervix uteri: Secondary | ICD-10-CM | POA: Diagnosis not present

## 2024-02-25 DIAGNOSIS — R2689 Other abnormalities of gait and mobility: Secondary | ICD-10-CM | POA: Diagnosis not present

## 2024-02-25 DIAGNOSIS — Z823 Family history of stroke: Secondary | ICD-10-CM | POA: Diagnosis not present

## 2024-02-25 DIAGNOSIS — Z9989 Dependence on other enabling machines and devices: Secondary | ICD-10-CM | POA: Diagnosis not present

## 2024-02-25 DIAGNOSIS — M810 Age-related osteoporosis without current pathological fracture: Secondary | ICD-10-CM | POA: Diagnosis not present

## 2024-02-25 DIAGNOSIS — I720 Aneurysm of carotid artery: Secondary | ICD-10-CM | POA: Diagnosis not present

## 2024-03-07 ENCOUNTER — Encounter: Payer: Self-pay | Admitting: Radiology

## 2024-03-17 ENCOUNTER — Encounter: Payer: Self-pay | Admitting: Neurology

## 2024-03-17 ENCOUNTER — Telehealth: Payer: Self-pay | Admitting: Neurology

## 2024-03-17 DIAGNOSIS — E1142 Type 2 diabetes mellitus with diabetic polyneuropathy: Secondary | ICD-10-CM | POA: Diagnosis not present

## 2024-03-17 DIAGNOSIS — E1151 Type 2 diabetes mellitus with diabetic peripheral angiopathy without gangrene: Secondary | ICD-10-CM | POA: Diagnosis not present

## 2024-03-17 DIAGNOSIS — Z6820 Body mass index (BMI) 20.0-20.9, adult: Secondary | ICD-10-CM | POA: Diagnosis not present

## 2024-03-17 DIAGNOSIS — G250 Essential tremor: Secondary | ICD-10-CM | POA: Diagnosis not present

## 2024-03-17 DIAGNOSIS — G40909 Epilepsy, unspecified, not intractable, without status epilepticus: Secondary | ICD-10-CM | POA: Diagnosis not present

## 2024-03-17 DIAGNOSIS — E1129 Type 2 diabetes mellitus with other diabetic kidney complication: Secondary | ICD-10-CM | POA: Diagnosis not present

## 2024-03-17 DIAGNOSIS — R809 Proteinuria, unspecified: Secondary | ICD-10-CM | POA: Diagnosis not present

## 2024-03-17 DIAGNOSIS — H532 Diplopia: Secondary | ICD-10-CM | POA: Diagnosis not present

## 2024-03-17 NOTE — Telephone Encounter (Signed)
 Ok to put on tomorrow's opening, thanks

## 2024-03-17 NOTE — Telephone Encounter (Signed)
 Primary care office called wanting to see if pt could possibly be seen sooner than her appt next year in march. She is in the office today complaining of blurred vision x 4 months. They done a vision exam and it was normal but they are still concerned. Her tremors are worse. She would like a call back for a f/u [phone] ext [phone]. She has saw Aquino and Tat

## 2024-03-18 ENCOUNTER — Other Ambulatory Visit

## 2024-03-18 ENCOUNTER — Encounter: Payer: Self-pay | Admitting: Neurology

## 2024-03-18 ENCOUNTER — Ambulatory Visit: Admitting: Neurology

## 2024-03-18 VITALS — BP 103/59 | HR 90 | Ht 62.5 in | Wt 117.8 lb

## 2024-03-18 DIAGNOSIS — H532 Diplopia: Secondary | ICD-10-CM

## 2024-03-18 DIAGNOSIS — G250 Essential tremor: Secondary | ICD-10-CM | POA: Diagnosis not present

## 2024-03-18 DIAGNOSIS — G40009 Localization-related (focal) (partial) idiopathic epilepsy and epileptic syndromes with seizures of localized onset, not intractable, without status epilepticus: Secondary | ICD-10-CM | POA: Diagnosis not present

## 2024-03-18 NOTE — Patient Instructions (Addendum)
 Good to see you.  Have bloodwork done for TSH, myasthenia panel  2. Proceed with brain MRI as scheduled, please ask them to send report to us   3. Proceed with Ophthalmology evaluation as discussed  4. Please consider getting an opinion at [facility] for the focused ultrasound procedure for Essential Tremor  5. Follow-up as scheduled in March 2026, call for any changes
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
## 2024-03-22 ENCOUNTER — Ambulatory Visit: Payer: Self-pay | Admitting: Neurology

## 2024-03-22 DIAGNOSIS — J321 Chronic frontal sinusitis: Secondary | ICD-10-CM | POA: Diagnosis not present

## 2024-03-22 DIAGNOSIS — H532 Diplopia: Secondary | ICD-10-CM | POA: Diagnosis not present

## 2024-03-22 NOTE — Telephone Encounter (Signed)
 PAP: Application for Marcelline Deist has been submitted to [employer] (AZ&Me), via fax

## 2024-03-24 NOTE — Telephone Encounter (Signed)
 PAP: Patient assistance application for Farxiga has been approved by PAP Companies: [employer] from 1/1/20206 to 05/04/2025. Medication should be delivered to PAP Delivery: Home. For further shipping updates, please contact [employer] ([employer]) at [phone]. Patient ID is: [id number]

## 2024-03-25 NOTE — Telephone Encounter (Signed)
Pt called an informed bloodwork was normal

## 2024-03-25 NOTE — Telephone Encounter (Signed)
-----   Message from Darice CHRISTELLA Shivers sent at 03/22/2024  4:44 PM EST ----- Pls let her know bloodwork was normal, thanks ----- Message ----- From: Interface, [facility] Lab Results In Sent: 03/19/2024  12:25 AM EST To: Darice CHRISTELLA Shivers, MD

## 2024-03-27 LAB — STRIATED MUSCLE ANTIBODY: STRIATED MUSCLE AB SCREEN: NEGATIVE

## 2024-03-27 LAB — TSH: TSH: 2.83 m[IU]/L (ref 0.40–4.50)

## 2024-03-27 LAB — ACETYLCHOLINE RECEPTOR, BINDING: A CHR BINDING ABS: <0.3 nmol/L

## 2024-04-14 ENCOUNTER — Telehealth: Payer: Self-pay

## 2024-04-14 NOTE — Telephone Encounter (Signed)
-----   Message from Darice Shivers, MD sent at 04/13/2024  9:56 AM EST ----- Regarding: MRI results Pls let her know I received the MRI/MRA report from [facility], brain and blood vessels look fine, no tumor, stroke, bleed, or narrowing of blood vessels seen. No brain changes to cause her double vision. Agree with Ophthalmology as next step. Thanks

## 2024-04-14 NOTE — Telephone Encounter (Signed)
 Pt called an informed that Dr Georjean stated she received the MRI/MRA report from [facility], brain and blood vessels look fine, no tumor, stroke, bleed, or narrowing of blood vessels seen. No brain changes to cause her double vision. Agree with Ophthalmology as next step

## 2024-05-27 ENCOUNTER — Ambulatory Visit: Admitting: Physician Assistant

## 2024-06-03 ENCOUNTER — Ambulatory Visit: Admitting: Physician Assistant

## 2024-06-07 ENCOUNTER — Ambulatory Visit: Admitting: Physician Assistant

## 2024-06-17 ENCOUNTER — Ambulatory Visit: Admitting: Physician Assistant

## 2024-07-08 ENCOUNTER — Ambulatory Visit: Admitting: Neurology
